# Patient Record
Sex: Male | Born: 1950
Health system: Southern US, Community
[De-identification: ages and names within clinical notes are randomized; demographics above are authoritative.]

## PROBLEM LIST (undated history)

## (undated) DIAGNOSIS — C61 Malignant neoplasm of prostate: Secondary | ICD-10-CM

## (undated) DIAGNOSIS — K219 Gastro-esophageal reflux disease without esophagitis: Secondary | ICD-10-CM

## (undated) DIAGNOSIS — E7800 Pure hypercholesterolemia, unspecified: Secondary | ICD-10-CM

## (undated) DIAGNOSIS — I10 Essential (primary) hypertension: Secondary | ICD-10-CM

## (undated) DIAGNOSIS — E039 Hypothyroidism, unspecified: Secondary | ICD-10-CM

## (undated) DIAGNOSIS — G4733 Obstructive sleep apnea (adult) (pediatric): Secondary | ICD-10-CM

## (undated) DIAGNOSIS — G709 Myoneural disorder, unspecified: Secondary | ICD-10-CM

## (undated) DIAGNOSIS — Z9884 Bariatric surgery status: Secondary | ICD-10-CM

## (undated) DIAGNOSIS — I350 Nonrheumatic aortic (valve) stenosis: Secondary | ICD-10-CM

## (undated) DIAGNOSIS — D649 Anemia, unspecified: Secondary | ICD-10-CM

## (undated) DIAGNOSIS — M1990 Unspecified osteoarthritis, unspecified site: Secondary | ICD-10-CM

## (undated) DIAGNOSIS — R011 Cardiac murmur, unspecified: Secondary | ICD-10-CM

## (undated) DIAGNOSIS — Z8719 Personal history of other diseases of the digestive system: Secondary | ICD-10-CM

## (undated) DIAGNOSIS — E119 Type 2 diabetes mellitus without complications: Secondary | ICD-10-CM

## (undated) DIAGNOSIS — G4730 Sleep apnea, unspecified: Secondary | ICD-10-CM

## (undated) DIAGNOSIS — E785 Hyperlipidemia, unspecified: Secondary | ICD-10-CM

## (undated) DIAGNOSIS — Z9989 Dependence on other enabling machines and devices: Secondary | ICD-10-CM

## (undated) DIAGNOSIS — Z6841 Body Mass Index (BMI) 40.0 and over, adult: Secondary | ICD-10-CM

## (undated) DIAGNOSIS — Z9889 Other specified postprocedural states: Secondary | ICD-10-CM

## (undated) HISTORY — DX: Dependence on other enabling machines and devices: Z99.89

## (undated) HISTORY — DX: Type 2 diabetes mellitus without complications: E11.9

## (undated) HISTORY — DX: Essential (primary) hypertension: I10

## (undated) HISTORY — DX: Obstructive sleep apnea (adult) (pediatric): G47.33

## (undated) HISTORY — DX: Cardiac murmur, unspecified: R01.1

## (undated) HISTORY — DX: Nonrheumatic aortic (valve) stenosis: I35.0

## (undated) HISTORY — DX: Sleep apnea, unspecified: G47.30

## (undated) HISTORY — DX: Gastro-esophageal reflux disease without esophagitis: K21.9

## (undated) HISTORY — PX: PROSTATE BIOPSY: SHX241

## (undated) HISTORY — PX: CARDIAC CATHETERIZATION: SHX172

## (undated) HISTORY — DX: Bariatric surgery status: Z98.84

## (undated) HISTORY — DX: Other specified postprocedural states: Z98.890

## (undated) HISTORY — DX: Body Mass Index (BMI) 40.0 and over, adult: Z684

## (undated) HISTORY — DX: Hyperlipidemia, unspecified: E78.5

## (undated) HISTORY — DX: Pure hypercholesterolemia, unspecified: E78.00

## (undated) HISTORY — DX: Personal history of other diseases of the digestive system: Z87.19

## (undated) HISTORY — DX: Morbid (severe) obesity due to excess calories: E66.01

---

## 1990-06-11 HISTORY — PX: HERNIA REPAIR: SHX51

## 1997-06-11 HISTORY — PX: OTHER SURGICAL HISTORY: SHX169

## 2007-12-03 ENCOUNTER — Ambulatory Visit (HOSPITAL_COMMUNITY): Admission: RE | Admit: 2007-12-03 | Discharge: 2007-12-03 | Payer: Self-pay | Admitting: Gastroenterology

## 2008-09-14 ENCOUNTER — Encounter: Admission: RE | Admit: 2008-09-14 | Discharge: 2008-09-14 | Payer: Self-pay | Admitting: General Practice

## 2010-10-24 NOTE — Op Note (Signed)
Dillon Jones, Dillon Jones                ACCOUNT NO.:  [id number]   MEDICAL RECORD NO.:  [id number]          PATIENT TYPE:  AMB   LOCATION:  ENDO                         FACILITY:  [facility]   PHYSICIAN:  James L. Malon Kindle., M.D.DATE OF BIRTH:  Mar 17, 1951   DATE OF PROCEDURE:  12/03/2007  DATE OF DISCHARGE:                               OPERATIVE REPORT   PROCEDURE:  Colonoscopy.   MEDICATIONS:  Fentanyl 75 mcg, Versed 6 mg IV.   INDICATIONS:  Slight anemia, in need of colon cancer screening.   DESCRIPTION OF PROCEDURE:  The procedure had been explained to the  patient and consent obtained, he has severe sleep apnea.  He was hooked  up to CPAP by respiratory therapy and monitored and did very well.  He  had the scope  inserted.  The Pentax adult scope was used.  We easily  reached the cecum; ileocecal valve and appendiceal orifice were seen.  The cecum, ascending, transverse, descending and sigmoid colon were seen  well.  No polyps, diverticula or any other lesions.  The scope was  retroflexed and the rectum was free of any lesions.  The scope was  withdrawn.  The patient tolerated the procedure well.   ASSESSMENT:  Normal screening colonoscopy in a gentleman with slight  anemia.   RECOMMEND:  Resume Hemoccults in 3-5 years.  Consider another  colonoscopy in 10 years.           ______________________________  [name]., M.D.     Waldron Session  D:  12/03/2007  T:  12/03/2007  Job:  [id number]   cc:   Otilio Connors. Gerri Spore, M.D.  Fax: [phone]

## 2012-02-06 ENCOUNTER — Ambulatory Visit (INDEPENDENT_AMBULATORY_CARE_PROVIDER_SITE_OTHER): Payer: BC Managed Care – PPO | Admitting: Surgery

## 2012-02-06 ENCOUNTER — Encounter (INDEPENDENT_AMBULATORY_CARE_PROVIDER_SITE_OTHER): Payer: Self-pay | Admitting: Surgery

## 2012-02-06 VITALS — BP 180/90 | HR 84 | Temp 97.2°F | Resp 16 | Ht 71.0 in | Wt >= 6400 oz

## 2012-02-06 DIAGNOSIS — E119 Type 2 diabetes mellitus without complications: Secondary | ICD-10-CM

## 2012-02-06 DIAGNOSIS — I10 Essential (primary) hypertension: Secondary | ICD-10-CM

## 2012-02-06 DIAGNOSIS — E7800 Pure hypercholesterolemia, unspecified: Secondary | ICD-10-CM

## 2012-02-06 DIAGNOSIS — Z9989 Dependence on other enabling machines and devices: Secondary | ICD-10-CM | POA: Insufficient documentation

## 2012-02-06 DIAGNOSIS — M1990 Unspecified osteoarthritis, unspecified site: Secondary | ICD-10-CM

## 2012-02-06 DIAGNOSIS — Z8719 Personal history of other diseases of the digestive system: Secondary | ICD-10-CM

## 2012-02-06 DIAGNOSIS — G4733 Obstructive sleep apnea (adult) (pediatric): Secondary | ICD-10-CM

## 2012-02-06 DIAGNOSIS — E039 Hypothyroidism, unspecified: Secondary | ICD-10-CM

## 2012-02-06 DIAGNOSIS — M129 Arthropathy, unspecified: Secondary | ICD-10-CM

## 2012-02-06 HISTORY — DX: Pure hypercholesterolemia, unspecified: E78.00

## 2012-02-06 HISTORY — DX: Type 2 diabetes mellitus without complications: E11.9

## 2012-02-06 HISTORY — DX: Obstructive sleep apnea (adult) (pediatric): G47.33

## 2012-02-06 HISTORY — DX: Personal history of other diseases of the digestive system: Z87.19

## 2012-02-06 NOTE — Patient Instructions (Signed)

## 2012-02-06 NOTE — Addendum Note (Signed)
Addended byLiliana Cline on: 02/06/2012 04:53 PM   Modules accepted: Orders

## 2012-02-06 NOTE — Progress Notes (Signed)
Chief Complaint:  History of morbid obesity for many years with BMI 60  History of Present Illness:  Dillon Jones is an 61 y.o. male who has been to our seminar.  In addition he has been to the Internet and has done quite a bit of research over the last couple years and once I Roux-en-Y gastric bypass. He has had up-and-down weight gain weight loss over number years sometimes being able to lose much is 40 pounds. He is recently become more concerned about difficulty in managing his diabetes for which she takes metformin, gylburide, and Actos.  He is interested in moving 4 with surgery for his weight and once to have a Roux-en-Y gastric bypass. However the procedure with him and some detail although distorted done a lot of research intentional about it. He's been saving a palatine him or he works at [employer]. He's had a previous umbilical hernia repair were later excised his umbilicus and implanted mesh.  Avoid proceed with the workup for a Roux-en-Y gastric bypass. He he RA uses a CPAP machine so we will not remove need that workup. I will encourage him to lose weight before we moved into this surgical ring and came in to better metal like shape.  Past Medical History  Diagnosis Date  . Diabetes mellitus   . GERD (gastroesophageal reflux disease)   . Hyperlipidemia   . Hypertension     Past Surgical History  Procedure Date  . Hernia repair     umb    Current Outpatient Prescriptions  Medication Sig Dispense Refill  . aspirin 81 MG tablet Take 81 mg by mouth daily.      Marland Kitchen glyBURIDE (DIABETA) 1.25 MG tablet Take 1.25 mg by mouth 2 (two) times daily with a meal. Pt cannot remember correct dosage      . ibuprofen (ADVIL,MOTRIN) 100 MG chewable tablet Chew 100 mg by mouth as needed.      Marland Kitchen levothyroxine (SYNTHROID, LEVOTHROID) 100 MCG tablet Take 100 mcg by mouth daily. Pt cannot remember correct dosage      . metFORMIN (GLUMETZA) 1000 MG (MOD) 24 hr tablet Take 1,000 mg by mouth 2  (two) times daily with a meal.      . pioglitazone (ACTOS) 15 MG tablet Take 15 mg by mouth daily. Pt cannot remember correct dosage      . simvastatin (ZOCOR) 10 MG tablet Take 10 mg by mouth at bedtime. Pt cannot remember correct dosage      . valsartan-hydrochlorothiazide (DIOVAN-HCT) 160-12.5 MG per tablet Take 1 tablet by mouth daily. Pt cannot remember correct dosage       Review of patient's allergies indicates no known allergies. Family History  Problem Relation Age of Onset  . Cancer Mother     breast  . Heart disease Father   . Diabetes Father   . Hypertension Father   . Diabetes Brother     2 brothers  . Hypertension Brother   . Diabetes Maternal Grandmother    Social History:   reports that he has never smoked. He has never used smokeless tobacco. He reports that he drinks alcohol. He reports that he does not use illicit drugs.   REVIEW OF SYSTEMS - PERTINENT POSITIVES ONLY: No DVT  Physical Exam:   Blood pressure 180/90, pulse 84, temperature 97.2 F (36.2 C), temperature source Temporal, resp. rate 16, height 5\' 11"  (1.803 m), weight 433 lb 3.2 oz (196.498 kg). Body mass index is 60.42 kg/(m^2).  Gen:  WDWN WM NAD  Neurological: Alert and oriented to person, place, and time. Motor and sensory function is grossly intact  Head: Normocephalic and atraumatic.  Eyes: Conjunctivae are normal. Pupils are equal, round, and reactive to light. No scleral icterus.  Neck: Normal range of motion. Neck supple. No tracheal deviation or thyromegaly present.  Cardiovascular:  SR without murmurs or gallops.  No carotid bruits Respiratory: Effort normal.  No respiratory distress. No chest wall tenderness. Breath sounds normal.  No wheezes, rales or rhonchi.  Abdomen:  Obese with no umbilicus and a transverse scar for repair with mesh.  GU: Musculoskeletal: Normal range of motion. Extremities are nontender. No cyanosis, edema or clubbing noted Lymphadenopathy: No cervical,  preauricular, postauricular or axillary adenopathy is present Skin: Skin is warm and dry. No rash noted. No diaphoresis. No erythema. No pallor. Pscyh: Normal mood and affect. Behavior is normal. Judgment and thought content normal.   LABORATORY RESULTS: No results found for this or any previous visit (from the past 48 hour(s)).  RADIOLOGY RESULTS: No results found.  Problem List: Patient Active Problem List  Diagnosis  . Diabetes mellitus-type II  . OSA on CPAP  . Hypothyroidism  . Hypertension  . Hypercholesteremia  . Arthritis  . H/O umbilical hernia repair-mesh used; done in [place]    Assessment & Plan: Super morbid obesity Lap roux en Y gastric bypass    Matt B. Daphine Deutscher, MD, [facility] [phone] beeper [phone]  02/06/2012 4:40 PM

## 2012-02-18 ENCOUNTER — Encounter: Payer: Self-pay | Admitting: *Deleted

## 2012-02-18 ENCOUNTER — Encounter: Payer: BC Managed Care – PPO | Attending: Surgery | Admitting: *Deleted

## 2012-02-18 DIAGNOSIS — Z713 Dietary counseling and surveillance: Secondary | ICD-10-CM | POA: Insufficient documentation

## 2012-02-18 DIAGNOSIS — Z01818 Encounter for other preprocedural examination: Secondary | ICD-10-CM | POA: Insufficient documentation

## 2012-02-18 LAB — CBC WITH DIFFERENTIAL/PLATELET
Basophils Relative: 1 % (ref 0–1)
Eosinophils Absolute: 0.2 10*3/uL (ref 0.0–0.7)
HCT: 38.4 % — ABNORMAL LOW (ref 39.0–52.0)
Hemoglobin: 12.3 g/dL — ABNORMAL LOW (ref 13.0–17.0)
MCH: 24.8 pg — ABNORMAL LOW (ref 26.0–34.0)
MCHC: 32 g/dL (ref 30.0–36.0)
MCV: 77.6 fL — ABNORMAL LOW (ref 78.0–100.0)
Monocytes Absolute: 0.5 10*3/uL (ref 0.1–1.0)
Monocytes Relative: 7 % (ref 3–12)

## 2012-02-18 LAB — HEMOGLOBIN A1C: Hgb A1c MFr Bld: 8.4 % — ABNORMAL HIGH (ref <5.7)

## 2012-02-18 LAB — TSH: TSH: 3.586 u[IU]/mL (ref 0.350–4.500)

## 2012-02-18 LAB — COMPREHENSIVE METABOLIC PANEL
Albumin: 4.1 g/dL (ref 3.5–5.2)
Alkaline Phosphatase: 46 U/L (ref 39–117)
BUN: 13 mg/dL (ref 6–23)
Glucose, Bld: 198 mg/dL — ABNORMAL HIGH (ref 70–99)
Potassium: 4.5 mEq/L (ref 3.5–5.3)
Total Bilirubin: 0.3 mg/dL (ref 0.3–1.2)

## 2012-02-18 LAB — LIPID PANEL
LDL Cholesterol: 88 mg/dL (ref 0–99)
Triglycerides: 130 mg/dL (ref <150)

## 2012-02-18 LAB — T4: T4, Total: 10.8 ug/dL (ref 5.0–12.5)

## 2012-02-18 NOTE — Patient Instructions (Signed)
   Follow Pre-Op Nutrition Goals to prepare for Gastric Bypass Surgery.   Call the [facility] at [phone] once you have been given your surgery date to enrolled in the Pre-Op Nutrition Class. You will need to attend this nutrition class 3-4 weeks prior to your surgery. 

## 2012-02-18 NOTE — Progress Notes (Signed)
  Pre-Op Assessment Visit:  Pre-Operative RYGB Surgery  Medical Nutrition Therapy:  Appt start time:  1000   End time:  1045.  Patient was seen on 02/18/2012 for Pre-Operative RYGB Nutrition Assessment. Assessment and letter of approval faxed to [facility] B[facility]ric Surgery Program coordinator on 02/18/2012.  Approval letter sent to [facility] center and will be available in the chart under the media tab.  Handouts given during visit include:  Pre-Op Goals   Bariatric Surgery Protein Shakes handout  Patient to call for Pre-Op and Post-Op Nutrition Education at the [facility] when surgery is scheduled.

## 2012-03-03 ENCOUNTER — Ambulatory Visit (HOSPITAL_COMMUNITY)
Admission: RE | Admit: 2012-03-03 | Discharge: 2012-03-03 | Disposition: A | Discharge status: Home / Self Care | Payer: BC Managed Care – PPO | Source: Ambulatory Visit | Attending: Surgery | Admitting: Surgery

## 2012-03-03 ENCOUNTER — Ambulatory Visit (HOSPITAL_COMMUNITY): Admission: RE | Admit: 2012-03-03 | Payer: BC Managed Care – PPO | Source: Ambulatory Visit | Admitting: Surgery

## 2012-03-03 ENCOUNTER — Encounter (HOSPITAL_COMMUNITY): Admission: RE | Payer: Self-pay | Source: Ambulatory Visit

## 2012-03-03 SURGERY — BREATH TEST, FOR HELICOBACTER PYLORI

## 2012-03-07 ENCOUNTER — Ambulatory Visit
Admission: RE | Admit: 2012-03-07 | Discharge: 2012-03-07 | Disposition: A | Discharge status: Home / Self Care | Payer: BC Managed Care – PPO | Source: Ambulatory Visit | Attending: Family Medicine | Admitting: Family Medicine

## 2012-03-07 ENCOUNTER — Other Ambulatory Visit: Payer: Self-pay | Admitting: Family Medicine

## 2012-03-07 DIAGNOSIS — I82409 Acute embolism and thrombosis of unspecified deep veins of unspecified lower extremity: Secondary | ICD-10-CM

## 2012-03-07 MED ORDER — IOHEXOL 300 MG/ML  SOLN
125.0000 mL | Freq: Once | INTRAMUSCULAR | Status: AC | PRN
  Administered 2012-03-07: 125 mL via INTRAVENOUS

## 2012-03-10 ENCOUNTER — Other Ambulatory Visit: Payer: BC Managed Care – PPO

## 2012-04-22 ENCOUNTER — Ambulatory Visit (HOSPITAL_COMMUNITY)
Admission: RE | Admit: 2012-04-22 | Discharge: 2012-04-22 | Disposition: A | Discharge status: Home / Self Care | Payer: BC Managed Care – PPO | Source: Ambulatory Visit | Attending: Surgery | Admitting: Surgery

## 2012-04-22 ENCOUNTER — Encounter (HOSPITAL_COMMUNITY): Payer: Self-pay

## 2012-04-22 ENCOUNTER — Encounter (HOSPITAL_COMMUNITY)
Admission: RE | Disposition: A | Discharge status: Home / Self Care | Payer: Self-pay | Source: Ambulatory Visit | Attending: Surgery

## 2012-04-22 ENCOUNTER — Encounter (HOSPITAL_COMMUNITY)
Admission: RE | Admit: 2012-04-22 | Discharge: 2012-04-22 | Disposition: A | Discharge status: Home / Self Care | Payer: BC Managed Care – PPO | Source: Ambulatory Visit | Attending: Surgery | Admitting: Surgery

## 2012-04-22 DIAGNOSIS — Z01818 Encounter for other preprocedural examination: Secondary | ICD-10-CM | POA: Insufficient documentation

## 2012-04-22 HISTORY — PX: BREATH TEK H PYLORI: SHX5422

## 2012-04-22 SURGERY — BREATH TEST, FOR HELICOBACTER PYLORI

## 2012-04-23 ENCOUNTER — Encounter (HOSPITAL_COMMUNITY): Payer: Self-pay | Admitting: Surgery

## 2012-04-29 ENCOUNTER — Other Ambulatory Visit (INDEPENDENT_AMBULATORY_CARE_PROVIDER_SITE_OTHER): Payer: Self-pay | Admitting: Surgery

## 2012-04-29 ENCOUNTER — Telehealth (INDEPENDENT_AMBULATORY_CARE_PROVIDER_SITE_OTHER): Payer: Self-pay | Admitting: General Surgery

## 2012-04-29 MED ORDER — AMOXICILL-CLARITHRO-LANSOPRAZ PO MISC: Freq: Two times a day (BID) | ORAL | Status: DC

## 2012-04-29 NOTE — Telephone Encounter (Signed)
Spoke with pt's wife and explained that his breath tek test came back positive for H pylori.  Dr. Daphine Deutscher sent in a Rx for a prevpac.  She said they would pick this up and start it.

## 2012-05-15 ENCOUNTER — Encounter: Payer: BC Managed Care – PPO | Attending: Surgery | Admitting: *Deleted

## 2012-05-15 VITALS — Ht 71.5 in | Wt >= 6400 oz

## 2012-05-15 DIAGNOSIS — Z713 Dietary counseling and surveillance: Secondary | ICD-10-CM | POA: Insufficient documentation

## 2012-05-15 DIAGNOSIS — Z6841 Body Mass Index (BMI) 40.0 and over, adult: Secondary | ICD-10-CM

## 2012-05-15 DIAGNOSIS — Z01818 Encounter for other preprocedural examination: Secondary | ICD-10-CM | POA: Insufficient documentation

## 2012-05-15 NOTE — Progress Notes (Signed)
Bariatric Class:  Appt start time: 1730   End time:  1830.  Pre-Operative Nutrition Class  Patient was seen on 05/15/12 for Pre-Operative Bariatric Surgery Education at the [facility].   Surgery date: 06/09/12 Surgery type: RYGB Start weight at [facility]: 429.3 lbs (02/18/12) Goal weight: 200 lbs  Weight today: 415.4 lbs BMI: 57.1  Samples given per MNT protocol: Bariatric Advantage Multivitamin Lot # [id number];  Exp: 06/15  Bariatric Advantage Calcium Citrate Lot # [id number];  Exp: 12/13 (*Pt alerted to upcoming expiration date)  Celebrate Vitamins Multivitamin Lot # [id number];  Exp: 07/14  Celebrate Vitamins Iron + C (18 mg) Lot # [id number];  Exp: 03/15  Celebrate Vitamins Calcium Citrate Lot # [id number];  Exp: 08/15  Corliss Marcus Protein Shake Lot # [id number];  Exp: 02/15  Premier Protein Shake Lot # [id number];  Exp: 03/29/13  The following the learning objective met by the patient during this course:  Identifies Pre-Op Dietary Goals and will begin 2 weeks pre-operatively  Identifies appropriate sources of fluids and proteins   States protein recommendations and appropriate sources pre and post-operatively  Identifies Post-Operative Dietary Goals and will follow for 2 weeks post-operatively  Identifies appropriate multivitamin and calcium sources  Describes the need for physical activity post-operatively and will follow MD recommendations  States when to call healthcare provider regarding medication questions or post-operative complications  Handouts given during class include:  Pre-Op Bariatric Surgery Diet Handout  Protein Shake Handout  Post-Op Bariatric Surgery Nutrition Handout  BELT Program Information Flyer  Support Group Information Flyer  [facility] Bariatric Supplements Price List  Follow-Up Plan: Patient will follow-up at [facility] 2 weeks post operatively for diet advancement per MD.

## 2012-05-18 ENCOUNTER — Encounter: Payer: Self-pay | Admitting: *Deleted

## 2012-05-18 NOTE — Patient Instructions (Signed)
Follow:   Pre-Op Diet per MD 2 weeks prior to surgery  Phase 2- Liquids (clear/full) 2 weeks after surgery  Vitamin/Mineral/Calcium guidelines for purchasing bariatric supplements  Exercise guidelines pre and post-op per MD  Follow-up at [facility] in 2 weeks post-op for diet advancement. Contact Harsha Yusko as needed with questions/concerns. 

## 2012-05-28 NOTE — Progress Notes (Signed)
EKG 04/22/12 on EPIC, Chest x-ray 03/03/12 on EPIC

## 2012-05-28 NOTE — Progress Notes (Signed)
Need pre-op orders for appointment on 05/29/12 at 930 please.

## 2012-05-28 NOTE — Patient Instructions (Addendum)
20 LEVIS NAZIR  05/28/2012   Your procedure is scheduled on: 06/09/12  Report to [facility] at [phone].  Call this number if you have problems the morning of surgery 336-: [phone]   Remember:   Do not eat food or drink liquids After Midnight.     Take these medicines the morning of surgery with A SIP OF WATER: synthroid, amlodipine, simvastatin    Do not wear jewelry, make-up or nail polish.  Do not wear lotions, powders, or perfumes. You may wear deodorant.  Do not shave 48 hours prior to surgery. Men may shave face and neck.  Do not bring valuables to the hospital.  Contacts, dentures or bridgework may not be worn into surgery.  Leave suitcase in the car. After surgery it may be brought to your room.  For patients admitted to the hospital, checkout time is 11:00 AM the day of discharge.     Special Instructions: Shower using CHG 2 nights before surgery and the night before surgery.  If you shower the day of surgery use CHG.  Use special wash - you have one bottle of CHG for all showers.  You should use approximately 1/3 of the bottle for each shower.   Please read over the following fact sheets that you were given: MRSA Information.  Birdie Sons, RN  pre op nurse call if needed [phone]    FAILURE TO FOLLOW THESE INSTRUCTIONS MAY RESULT IN CANCELLATION OF YOUR SURGERY   Patient Signature: ___________________________________________

## 2012-05-29 ENCOUNTER — Encounter (HOSPITAL_COMMUNITY)
Admission: RE | Admit: 2012-05-29 | Discharge: 2012-05-29 | Disposition: A | Discharge status: Home / Self Care | Payer: BC Managed Care – PPO | Source: Ambulatory Visit | Attending: Surgery | Admitting: Surgery

## 2012-05-29 ENCOUNTER — Encounter (HOSPITAL_COMMUNITY): Payer: Self-pay

## 2012-05-29 ENCOUNTER — Encounter (HOSPITAL_COMMUNITY): Payer: Self-pay | Admitting: Pharmacy Technician

## 2012-05-29 HISTORY — DX: Hypothyroidism, unspecified: E03.9

## 2012-05-29 HISTORY — DX: Unspecified osteoarthritis, unspecified site: M19.90

## 2012-05-29 HISTORY — DX: Anemia, unspecified: D64.9

## 2012-05-29 LAB — CBC
MCV: 79.3 fL (ref 78.0–100.0)
Platelets: 250 10*3/uL (ref 150–400)
RBC: 5.11 MIL/uL (ref 4.22–5.81)
WBC: 7.4 10*3/uL (ref 4.0–10.5)

## 2012-05-29 LAB — BASIC METABOLIC PANEL
CO2: 27 mEq/L (ref 19–32)
Chloride: 97 mEq/L (ref 96–112)
Creatinine, Ser: 0.77 mg/dL (ref 0.50–1.35)
Potassium: 4.3 mEq/L (ref 3.5–5.1)
Sodium: 135 mEq/L (ref 135–145)

## 2012-05-29 LAB — SURGICAL PCR SCREEN: Staphylococcus aureus: NEGATIVE

## 2012-06-05 ENCOUNTER — Ambulatory Visit (INDEPENDENT_AMBULATORY_CARE_PROVIDER_SITE_OTHER): Payer: BC Managed Care – PPO | Admitting: Surgery

## 2012-06-05 ENCOUNTER — Other Ambulatory Visit (INDEPENDENT_AMBULATORY_CARE_PROVIDER_SITE_OTHER): Payer: Self-pay | Admitting: Surgery

## 2012-06-05 ENCOUNTER — Encounter (INDEPENDENT_AMBULATORY_CARE_PROVIDER_SITE_OTHER): Payer: Self-pay | Admitting: Surgery

## 2012-06-05 ENCOUNTER — Other Ambulatory Visit (INDEPENDENT_AMBULATORY_CARE_PROVIDER_SITE_OTHER): Payer: Self-pay

## 2012-06-05 VITALS — BP 136/82 | HR 80 | Temp 97.6°F | Resp 18 | Ht 71.5 in | Wt >= 6400 oz

## 2012-06-05 DIAGNOSIS — Z6841 Body Mass Index (BMI) 40.0 and over, adult: Secondary | ICD-10-CM

## 2012-06-05 HISTORY — DX: Morbid (severe) obesity due to excess calories: E66.01

## 2012-06-05 NOTE — Patient Instructions (Signed)

## 2012-06-05 NOTE — Progress Notes (Signed)
Chief Complaint:  Morbid obesity BMI 55.8; weight 405 lbs  History of Present Illness:  Dillon Jones is an 61 y.o. male seen by me in August for Roux Y Gastric bypass surgery.  Did have some swelling in his right leg that may have been cellulitis. No DVT was found. He has lost down to a BMI of 55.8. We discussed his prior ventral hernia repair and he realizes that this could create adhesions which would preclude a gastric bypass. Hopefully that will not be the case. We will go ahead and do a bowel prep on him preoperatively and I discussed that with him. He signed his permit which I cosigned. He is aware of the risk of this procedure.  Past Medical History  Diagnosis Date  . Hyperlipidemia   . Hypertension   . Morbid obesity   . GERD (gastroesophageal reflux disease)     Occasional - only 1x q3m  . Sleep apnea   . Hypothyroidism   . Diabetes mellitus     Has had for 13 years, type 2  . Arthritis   . Anemia     Past Surgical History  Procedure Date  . Hernia repair 1992    umbilical  . Breath tek h pylori 04/22/2012    Procedure: BREATH TEK H PYLORI;  Surgeon: Khye Hochstetler B Kirandeep Fariss, MD;  Location: [facility];  Service: General;  Laterality: N/A;  . Right knee arthroscopy 1999    Current Outpatient Prescriptions  Medication Sig Dispense Refill  . amLODipine (NORVASC) 10 MG tablet Take 10 mg by mouth daily before breakfast.       . glyBURIDE (DIABETA) 2.5 MG tablet Take 2.5 mg by mouth 2 (two) times daily with a meal.      . levothyroxine (SYNTHROID, LEVOTHROID) 200 MCG tablet Take 200 mcg by mouth daily before breakfast.       . losartan-hydrochlorothiazide (HYZAAR) 100-12.5 MG per tablet Take 1 tablet by mouth daily before breakfast.       . metFORMIN (GLUMETZA) 1000 MG (MOD) 24 hr tablet Take 1,000 mg by mouth 2 (two) times daily with a meal.      . Multiple Vitamin (MULTIVITAMIN WITH MINERALS) TABS Take 1 tablet by mouth daily.      . pioglitazone (ACTOS) 15 MG tablet Take 15 mg  by mouth daily before breakfast.       . simvastatin (ZOCOR) 40 MG tablet Take 20 mg by mouth daily before breakfast. Takes 1/2      . aspirin 81 MG tablet Take 81 mg by mouth daily.      . ibuprofen (ADVIL,MOTRIN) 200 MG tablet Take 600 mg by mouth every 6 (six) hours as needed. Pain      . naproxen sodium (ANAPROX) 220 MG tablet Take 660 mg by mouth 2 (two) times daily with a meal.       Review of patient's allergies indicates no known allergies. Family History  Problem Relation Age of Onset  . Cancer Mother     breast  . Heart disease Father   . Diabetes Father   . Hypertension Father   . Diabetes Brother     2 brothers  . Hypertension Brother   . Diabetes Maternal Grandmother    Social History:   reports that he has never smoked. He has never used smokeless tobacco. He reports that he drinks alcohol. He reports that he does not use illicit drugs.   REVIEW OF SYSTEMS - PERTINENT POSITIVES ONLY: No history of   DVT  Physical Exam:   Blood pressure 136/82, pulse 80, temperature 97.6 F (36.4 C), temperature source Temporal, resp. rate 18, height 5' 11.5" (1.816 m), weight 405 lb 12.8 oz (184.07 kg). Body mass index is 55.81 kg/(m^2).  Gen:  WDWN white male NAD  Neurological: Alert and oriented to person, place, and time. Motor and sensory function is grossly intact  Head: Normocephalic and atraumatic.  Eyes: Conjunctivae are normal. Pupils are equal, round, and reactive to light. No scleral icterus.  Neck: Normal range of motion. Neck supple. No tracheal deviation or thyromegaly present.  Cardiovascular:  SR without murmurs or gallops.  No carotid bruits Respiratory: Effort normal.  No respiratory distress. No chest wall tenderness. Breath sounds normal.  No wheezes, rales or rhonchi.  Abdomen:  Transverse incision from prior ventral hernia repair. GU: Musculoskeletal: Normal range of motion. Extremities are nontender. No cyanosis, edema or clubbing noted Lymphadenopathy: No  cervical, preauricular, postauricular or axillary adenopathy is present Skin: Skin is warm and dry. No rash noted. No diaphoresis. No erythema. No pallor. Pscyh: Normal mood and affect. Behavior is normal. Judgment and thought content normal.   LABORATORY RESULTS: No results found for this or any previous visit (from the past 48 hour(s)).  RADIOLOGY RESULTS: No results found.  Problem List: Patient Active Problem List  Diagnosis  . Diabetes mellitus-type II  . OSA on CPAP  . Hypothyroidism  . Hypertension  . Hypercholesteremia  . Arthritis  . H/O umbilical hernia repair-mesh used; done in [place]    Assessment & Plan: Morbid obesity and a male with a BMI 55.8. Plan Roux-en-Y gastric bypass next Monday, December 30.    Matt B. Elianah Karis, MD, FACS  [facility] [phone] beeper [phone]  06/05/2012 11:12 AM     

## 2012-06-09 ENCOUNTER — Encounter (HOSPITAL_COMMUNITY): Payer: Self-pay | Admitting: *Deleted

## 2012-06-09 ENCOUNTER — Encounter (HOSPITAL_COMMUNITY): Payer: Self-pay | Admitting: Anesthesiology

## 2012-06-09 ENCOUNTER — Encounter (HOSPITAL_COMMUNITY)
Admission: RE | Disposition: A | Discharge status: Home / Self Care | Payer: Self-pay | Source: Ambulatory Visit | Attending: Surgery

## 2012-06-09 ENCOUNTER — Inpatient Hospital Stay (HOSPITAL_COMMUNITY)
Admission: RE | Admit: 2012-06-09 | Discharge: 2012-06-11 | DRG: 288 | Disposition: A | Discharge status: Home / Self Care | Payer: BC Managed Care – PPO | Source: Ambulatory Visit | Attending: Surgery | Admitting: Surgery

## 2012-06-09 ENCOUNTER — Inpatient Hospital Stay (HOSPITAL_COMMUNITY): Payer: BC Managed Care – PPO | Admitting: Anesthesiology

## 2012-06-09 ENCOUNTER — Other Ambulatory Visit (INDEPENDENT_AMBULATORY_CARE_PROVIDER_SITE_OTHER): Payer: Self-pay | Admitting: Surgery

## 2012-06-09 DIAGNOSIS — G4733 Obstructive sleep apnea (adult) (pediatric): Secondary | ICD-10-CM

## 2012-06-09 DIAGNOSIS — E785 Hyperlipidemia, unspecified: Secondary | ICD-10-CM

## 2012-06-09 DIAGNOSIS — Z79899 Other long term (current) drug therapy: Secondary | ICD-10-CM

## 2012-06-09 DIAGNOSIS — Z7982 Long term (current) use of aspirin: Secondary | ICD-10-CM

## 2012-06-09 DIAGNOSIS — I10 Essential (primary) hypertension: Secondary | ICD-10-CM

## 2012-06-09 DIAGNOSIS — E039 Hypothyroidism, unspecified: Secondary | ICD-10-CM | POA: Diagnosis present

## 2012-06-09 DIAGNOSIS — N4889 Other specified disorders of penis: Secondary | ICD-10-CM | POA: Diagnosis present

## 2012-06-09 DIAGNOSIS — Z6841 Body Mass Index (BMI) 40.0 and over, adult: Secondary | ICD-10-CM

## 2012-06-09 DIAGNOSIS — M129 Arthropathy, unspecified: Secondary | ICD-10-CM | POA: Diagnosis present

## 2012-06-09 DIAGNOSIS — Z9884 Bariatric surgery status: Secondary | ICD-10-CM

## 2012-06-09 DIAGNOSIS — K430 Incisional hernia with obstruction, without gangrene: Secondary | ICD-10-CM | POA: Diagnosis present

## 2012-06-09 DIAGNOSIS — K219 Gastro-esophageal reflux disease without esophagitis: Secondary | ICD-10-CM | POA: Diagnosis present

## 2012-06-09 DIAGNOSIS — E119 Type 2 diabetes mellitus without complications: Secondary | ICD-10-CM | POA: Diagnosis present

## 2012-06-09 DIAGNOSIS — N35919 Unspecified urethral stricture, male, unspecified site: Secondary | ICD-10-CM | POA: Diagnosis present

## 2012-06-09 HISTORY — PX: GASTRIC ROUX-EN-Y: SHX5262

## 2012-06-09 HISTORY — PX: VENTRAL HERNIA REPAIR: SHX424

## 2012-06-09 HISTORY — DX: Bariatric surgery status: Z98.84

## 2012-06-09 LAB — GLUCOSE, CAPILLARY: Glucose-Capillary: 204 mg/dL — ABNORMAL HIGH (ref 70–99)

## 2012-06-09 LAB — COMPREHENSIVE METABOLIC PANEL
ALT: 20 U/L (ref 0–53)
AST: 20 U/L (ref 0–37)
Albumin: 3.7 g/dL (ref 3.5–5.2)
Calcium: 9.4 mg/dL (ref 8.4–10.5)
Creatinine, Ser: 0.78 mg/dL (ref 0.50–1.35)
GFR calc non Af Amer: >90 mL/min (ref >90)
Sodium: 136 mEq/L (ref 135–145)
Total Protein: 8.1 g/dL (ref 6.0–8.3)

## 2012-06-09 LAB — CBC WITH DIFFERENTIAL/PLATELET
Basophils Absolute: 0 10*3/uL (ref 0.0–0.1)
Basophils Relative: 0 % (ref 0–1)
Eosinophils Absolute: 0.1 10*3/uL (ref 0.0–0.7)
Eosinophils Relative: 1 % (ref 0–5)
HCT: 39.7 % (ref 39.0–52.0)
Lymphocytes Relative: 14 % (ref 12–46)
MCHC: 32.7 g/dL (ref 30.0–36.0)
MCV: 79.2 fL (ref 78.0–100.0)
Monocytes Absolute: 0.6 10*3/uL (ref 0.1–1.0)
Platelets: 254 10*3/uL (ref 150–400)
RDW: 16.6 % — ABNORMAL HIGH (ref 11.5–15.5)
WBC: 9 10*3/uL (ref 4.0–10.5)

## 2012-06-09 SURGERY — LAPAROSCOPIC ROUX-EN-Y GASTRIC
Anesthesia: General | Site: Abdomen | Wound class: Clean Contaminated

## 2012-06-09 MED ORDER — BUPIVACAINE LIPOSOME 1.3 % IJ SUSP
INTRAMUSCULAR | Status: DC | PRN
  Administered 2012-06-09: 20 mL

## 2012-06-09 MED ORDER — POTASSIUM CHLORIDE IN NACL 20-0.45 MEQ/L-% IV SOLN
INTRAVENOUS | Status: DC
  Administered 2012-06-09: 100 mL/h via INTRAVENOUS
  Administered 2012-06-10: 100 mL via INTRAVENOUS
  Filled 2012-06-09 (×7): qty 1000

## 2012-06-09 MED ORDER — UNJURY VANILLA POWDER
2.0000 [oz_av] | Freq: Four times a day (QID) | ORAL | Status: DC
  Administered 2012-06-11: 2 [oz_av] via ORAL

## 2012-06-09 MED ORDER — ONDANSETRON HCL 4 MG/2ML IJ SOLN
INTRAMUSCULAR | Status: DC | PRN
  Administered 2012-06-09: 4 mg via INTRAVENOUS

## 2012-06-09 MED ORDER — LACTATED RINGERS IR SOLN
Status: DC | PRN
  Administered 2012-06-09: 3000 mL

## 2012-06-09 MED ORDER — PROMETHAZINE HCL 25 MG/ML IJ SOLN: 6.2500 mg | INTRAMUSCULAR | Status: DC | PRN

## 2012-06-09 MED ORDER — HYDROMORPHONE HCL PF 1 MG/ML IJ SOLN: 0.2500 mg | INTRAMUSCULAR | Status: DC | PRN

## 2012-06-09 MED ORDER — DEXTROSE 5 % IV SOLN
2.0000 g | INTRAVENOUS | Status: AC
  Administered 2012-06-09: 2 g via INTRAVENOUS
  Filled 2012-06-09: qty 2

## 2012-06-09 MED ORDER — ACETAMINOPHEN 160 MG/5ML PO SOLN: 650.0000 mg | ORAL | Status: DC | PRN

## 2012-06-09 MED ORDER — CHLORHEXIDINE GLUCONATE 4 % EX LIQD: 1.0000 "application " | Freq: Once | CUTANEOUS | Status: DC

## 2012-06-09 MED ORDER — ROCURONIUM BROMIDE 100 MG/10ML IV SOLN
INTRAVENOUS | Status: DC | PRN
  Administered 2012-06-09 (×2): 20 mg via INTRAVENOUS
  Administered 2012-06-09: 10 mg via INTRAVENOUS
  Administered 2012-06-09 (×2): 20 mg via INTRAVENOUS
  Administered 2012-06-09: 50 mg via INTRAVENOUS
  Administered 2012-06-09: 10 mg via INTRAVENOUS

## 2012-06-09 MED ORDER — 0.9 % SODIUM CHLORIDE (POUR BTL) OPTIME
TOPICAL | Status: DC | PRN
  Administered 2012-06-09: 1000 mL

## 2012-06-09 MED ORDER — ONDANSETRON HCL 4 MG/2ML IJ SOLN: 4.0000 mg | INTRAMUSCULAR | Status: DC | PRN

## 2012-06-09 MED ORDER — HEPARIN SODIUM (PORCINE) 5000 UNIT/ML IJ SOLN
5000.0000 [IU] | Freq: Three times a day (TID) | INTRAMUSCULAR | Status: DC
  Administered 2012-06-09 – 2012-06-11 (×5): 5000 [IU] via SUBCUTANEOUS
  Filled 2012-06-09 (×9): qty 1

## 2012-06-09 MED ORDER — UNJURY CHICKEN SOUP POWDER: 2.0000 [oz_av] | Freq: Four times a day (QID) | ORAL | Status: DC

## 2012-06-09 MED ORDER — MIDAZOLAM HCL 5 MG/5ML IJ SOLN
INTRAMUSCULAR | Status: DC | PRN
  Administered 2012-06-09: 2 mg via INTRAVENOUS

## 2012-06-09 MED ORDER — SUCCINYLCHOLINE CHLORIDE 20 MG/ML IJ SOLN
INTRAMUSCULAR | Status: DC | PRN
  Administered 2012-06-09: 200 mg via INTRAVENOUS

## 2012-06-09 MED ORDER — KETOROLAC TROMETHAMINE 30 MG/ML IJ SOLN: 15.0000 mg | Freq: Once | INTRAMUSCULAR | Status: DC | PRN

## 2012-06-09 MED ORDER — MORPHINE SULFATE 2 MG/ML IJ SOLN: 2.0000 mg | INTRAMUSCULAR | Status: DC | PRN

## 2012-06-09 MED ORDER — HEPARIN SODIUM (PORCINE) 5000 UNIT/ML IJ SOLN
5000.0000 [IU] | INTRAMUSCULAR | Status: AC
  Administered 2012-06-09: 5000 [IU] via SUBCUTANEOUS
  Filled 2012-06-09: qty 1

## 2012-06-09 MED ORDER — TISSEEL VH 10 ML EX KIT
PACK | CUTANEOUS | Status: DC | PRN
  Administered 2012-06-09: 10 mL

## 2012-06-09 MED ORDER — GLYCOPYRROLATE 0.2 MG/ML IJ SOLN
INTRAMUSCULAR | Status: DC | PRN
  Administered 2012-06-09: .9 mg via INTRAVENOUS

## 2012-06-09 MED ORDER — CHLORHEXIDINE GLUCONATE 4 % EX LIQD
1.0000 "application " | Freq: Once | CUTANEOUS | Status: DC
  Filled 2012-06-09: qty 15

## 2012-06-09 MED ORDER — HYDROMORPHONE HCL PF 1 MG/ML IJ SOLN
INTRAMUSCULAR | Status: DC | PRN
  Administered 2012-06-09 (×2): 0.5 mg via INTRAVENOUS
  Administered 2012-06-09: 1 mg via INTRAVENOUS
  Administered 2012-06-09: 0.5 mg via INTRAVENOUS

## 2012-06-09 MED ORDER — UNJURY CHOCOLATE CLASSIC POWDER: 2.0000 [oz_av] | Freq: Four times a day (QID) | ORAL | Status: DC

## 2012-06-09 MED ORDER — NEOSTIGMINE METHYLSULFATE 1 MG/ML IJ SOLN
INTRAMUSCULAR | Status: DC | PRN
  Administered 2012-06-09: 5 mg via INTRAVENOUS

## 2012-06-09 MED ORDER — FENTANYL CITRATE 0.05 MG/ML IJ SOLN
INTRAMUSCULAR | Status: DC | PRN
  Administered 2012-06-09 (×4): 50 ug via INTRAVENOUS
  Administered 2012-06-09: 100 ug via INTRAVENOUS
  Administered 2012-06-09 (×4): 50 ug via INTRAVENOUS

## 2012-06-09 MED ORDER — INSULIN ASPART 100 UNIT/ML ~~LOC~~ SOLN
0.0000 [IU] | SUBCUTANEOUS | Status: DC
  Administered 2012-06-09: 7 [IU] via SUBCUTANEOUS
  Administered 2012-06-10: 3 [IU] via SUBCUTANEOUS
  Administered 2012-06-10 (×3): 4 [IU] via SUBCUTANEOUS
  Administered 2012-06-11 (×2): 3 [IU] via SUBCUTANEOUS

## 2012-06-09 MED ORDER — LIDOCAINE HCL (CARDIAC) 20 MG/ML IV SOLN
INTRAVENOUS | Status: DC | PRN
  Administered 2012-06-09: 50 mg via INTRAVENOUS

## 2012-06-09 MED ORDER — LACTATED RINGERS IV SOLN
INTRAVENOUS | Status: DC
  Administered 2012-06-09: 14:00:00 via INTRAVENOUS
  Administered 2012-06-09: 1000 mL via INTRAVENOUS

## 2012-06-09 MED ORDER — OXYCODONE-ACETAMINOPHEN 5-325 MG/5ML PO SOLN: 5.0000 mL | ORAL | Status: DC | PRN

## 2012-06-09 MED ORDER — INSULIN GLARGINE 100 UNIT/ML ~~LOC~~ SOLN
10.0000 [IU] | Freq: Every day | SUBCUTANEOUS | Status: DC
  Administered 2012-06-09 – 2012-06-10 (×2): 10 [IU] via SUBCUTANEOUS

## 2012-06-09 MED ORDER — BUPIVACAINE LIPOSOME 1.3 % IJ SUSP
20.0000 mL | Freq: Once | INTRAMUSCULAR | Status: DC
  Filled 2012-06-09: qty 20

## 2012-06-09 SURGICAL SUPPLY — 72 items
APPLICATOR COTTON TIP 6IN STRL (MISCELLANEOUS) IMPLANT
APPLIER CLIP ROT 10 11.4 M/L (STAPLE) ×3
BAG URINE LEG 500ML (DRAIN) ×3 IMPLANT
BENZOIN TINCTURE PRP APPL 2/3 (GAUZE/BANDAGES/DRESSINGS) IMPLANT
BLADE SURG 15 STRL LF DISP TIS (BLADE) ×2 IMPLANT
BLADE SURG 15 STRL SS (BLADE) ×1
CABLE HIGH FREQUENCY MONO STRZ (ELECTRODE) ×3 IMPLANT
CANISTER SUCTION 2500CC (MISCELLANEOUS) ×3 IMPLANT
CATH FOLEY 2WAY SLVR  5CC 16FR (CATHETERS) ×1
CATH FOLEY 2WAY SLVR 5CC 16FR (CATHETERS) ×2 IMPLANT
CLIP APPLIE ROT 10 11.4 M/L (STAPLE) ×2 IMPLANT
CLIP SUT LAPRA TY ABSORB (SUTURE) ×6 IMPLANT
CLOTH BEACON ORANGE TIMEOUT ST (SAFETY) ×3 IMPLANT
COVER SURGICAL LIGHT HANDLE (MISCELLANEOUS) ×3 IMPLANT
DERMABOND ADVANCED (GAUZE/BANDAGES/DRESSINGS) ×1
DERMABOND ADVANCED .7 DNX12 (GAUZE/BANDAGES/DRESSINGS) ×2 IMPLANT
DEVICE SUTURE ENDOST 10MM (ENDOMECHANICALS) ×3 IMPLANT
DISSECTOR BLUNT TIP ENDO 5MM (MISCELLANEOUS) IMPLANT
DRAIN PENROSE 18X1/4 LTX STRL (WOUND CARE) ×3 IMPLANT
DRAPE CAMERA CLOSED 9X96 (DRAPES) ×3 IMPLANT
GAUZE SPONGE 2X2 8PLY STRL LF (GAUZE/BANDAGES/DRESSINGS) ×2 IMPLANT
GAUZE SPONGE 4X4 16PLY XRAY LF (GAUZE/BANDAGES/DRESSINGS) ×3 IMPLANT
GEL PDS (MISCELLANEOUS) ×6 IMPLANT
GLOVE BIOGEL M 8.0 STRL (GLOVE) ×3 IMPLANT
GOWN STRL NON-REIN LRG LVL3 (GOWN DISPOSABLE) ×3 IMPLANT
GOWN STRL REIN XL XLG (GOWN DISPOSABLE) ×9 IMPLANT
HANDLE STAPLE EGIA 4 XL (STAPLE) ×3 IMPLANT
HOVERMATT SINGLE USE (MISCELLANEOUS) ×3 IMPLANT
KIT BASIN OR (CUSTOM PROCEDURE TRAY) ×3 IMPLANT
KIT GASTRIC LAVAGE 34FR ADT (SET/KITS/TRAYS/PACK) ×3 IMPLANT
MARKER SKIN DUAL TIP RULER LAB (MISCELLANEOUS) ×3 IMPLANT
NEEDLE SPNL 22GX3.5 QUINCKE BK (NEEDLE) ×3 IMPLANT
NS IRRIG 1000ML POUR BTL (IV SOLUTION) ×3 IMPLANT
PACK CARDIOVASCULAR III (CUSTOM PROCEDURE TRAY) ×3 IMPLANT
RELOAD EGIA 45 MED/THCK PURPLE (STAPLE) ×6 IMPLANT
RELOAD EGIA 45 TAN VASC (STAPLE) ×3 IMPLANT
RELOAD EGIA 60 MED/THCK PURPLE (STAPLE) ×12 IMPLANT
RELOAD EGIA 60 TAN VASC (STAPLE) ×3 IMPLANT
RELOAD ENDO STITCH 2.0 (ENDOMECHANICALS) ×10
RETRACTOR LAPSCP 12X46 CVD (ENDOMECHANICALS) ×2 IMPLANT
RTRCTR LAPSCP 12X46 CVD (ENDOMECHANICALS) ×3
SCISSORS LAP 5X45 EPIX DISP (ENDOMECHANICALS) ×3 IMPLANT
SEALANT SURGICAL APPL DUAL CAN (MISCELLANEOUS) ×3 IMPLANT
SET IRRIG TUBING LAPAROSCOPIC (IRRIGATION / IRRIGATOR) ×3 IMPLANT
SHEARS CURVED HARMONIC AC 45CM (MISCELLANEOUS) ×3 IMPLANT
SLEEVE ADV FIXATION 12X100MM (TROCAR) ×6 IMPLANT
SLEEVE ADV FIXATION 5X100MM (TROCAR) ×3 IMPLANT
SOLUTION ANTI FOG 6CC (MISCELLANEOUS) ×3 IMPLANT
SPONGE GAUZE 2X2 STER 10/PKG (GAUZE/BANDAGES/DRESSINGS) ×1
SPONGE GAUZE 4X4 12PLY (GAUZE/BANDAGES/DRESSINGS) ×3 IMPLANT
STAPLER VISISTAT 35W (STAPLE) ×3 IMPLANT
STRIP CLOSURE SKIN 1/2X4 (GAUZE/BANDAGES/DRESSINGS) IMPLANT
STRIP PERI DRY VERITAS 45 (STAPLE) ×6 IMPLANT
STRIP PERI DRY VERITAS 60 (STAPLE) ×6 IMPLANT
SUT RELOAD ENDO STITCH 2 48X1 (ENDOMECHANICALS) ×12
SUT RELOAD ENDO STITCH 2.0 (ENDOMECHANICALS) ×8
SUT VIC AB 2-0 SH 27 (SUTURE) ×1
SUT VIC AB 2-0 SH 27X BRD (SUTURE) ×2 IMPLANT
SUT VIC AB 4-0 SH 18 (SUTURE) ×3 IMPLANT
SUT VICRYL 0 TIES 12 18 (SUTURE) ×3 IMPLANT
SUTURE RELOAD END STTCH 2 48X1 (ENDOMECHANICALS) ×12 IMPLANT
SUTURE RELOAD ENDO STITCH 2.0 (ENDOMECHANICALS) ×8 IMPLANT
SYR 20CC LL (SYRINGE) ×3 IMPLANT
SYR 30ML LL (SYRINGE) ×3 IMPLANT
SYR 50ML LL SCALE MARK (SYRINGE) ×3 IMPLANT
TRAY FOLEY CATH 14FRSI W/METER (CATHETERS) ×3 IMPLANT
TROCAR ADV FIXATION 12X100MM (TROCAR) ×6 IMPLANT
TROCAR XCEL 12X100 BLDLESS (ENDOMECHANICALS) ×3 IMPLANT
TROCAR Z-THREAD FIOS 5X100MM (TROCAR) ×3 IMPLANT
TUBING CONNECTING 10 (TUBING) ×3 IMPLANT
TUBING ENDO SMARTCAP (MISCELLANEOUS) ×3 IMPLANT
TUBING FILTER THERMOFLATOR (ELECTROSURGICAL) ×3 IMPLANT

## 2012-06-09 NOTE — Progress Notes (Signed)
Selena Batten, R.N.- From PACU TO TAKE PATIENT to Stepdown Unit and give report

## 2012-06-09 NOTE — Op Note (Signed)
Upper GI endoscopy is performed at the completion of laparoscopic Roux-en-Y gastric bypass by Dr. Daphine Deutscher. The Olympus video endoscope was inserted into the upper esophagus and then passed under direct vision to the EG junction. The  gastric pouch was insufflated with air while the gastric outlet was clamped under irrigation by the operating surgeon. There was no evidence of leak. The anastomosis was visualized and was patent. Suture and staple lines were intact and without bleeding. The pouch was tubular and measured approximately 6-7 cm in length. At the completion of the procedure the pouch was desufflated and the scope withdrawn.

## 2012-06-09 NOTE — Anesthesia Preprocedure Evaluation (Addendum)
Anesthesia Evaluation  Patient identified by MRN, date of birth, ID band Patient awake    Reviewed: Allergy & Precautions, H&P , NPO status , Patient's Chart, lab work & pertinent test results  Airway Mallampati: III TM Distance: <3 FB Neck ROM: Full    Dental No notable dental hx. (+) Dental Advisory Given   Pulmonary sleep apnea and Continuous Positive Airway Pressure Ventilation ,  breath sounds clear to auscultation  + decreased breath sounds      Cardiovascular hypertension, Pt. on medications Rhythm:Regular Rate:Normal     Neuro/Psych negative neurological ROS  negative psych ROS   GI/Hepatic negative GI ROS, Neg liver ROS,   Endo/Other  diabetes, Type 2Hypothyroidism Morbid obesity  Renal/GU negative Renal ROS  negative genitourinary   Musculoskeletal negative musculoskeletal ROS (+)   Abdominal   Peds negative pediatric ROS (+)  Hematology negative hematology ROS (+)   Anesthesia Other Findings   Reproductive/Obstetrics negative OB ROS                           Anesthesia Physical Anesthesia Plan  ASA: III  Anesthesia Plan: General   Post-op Pain Management:    Induction: Intravenous  Airway Management Planned: Oral ETT and Video Laryngoscope Planned  Additional Equipment:   Intra-op Plan:   Post-operative Plan: Extubation in OR  Informed Consent: I have reviewed the patients History and Physical, chart, labs and discussed the procedure including the risks, benefits and alternatives for the proposed anesthesia with the patient or authorized representative who has indicated his/her understanding and acceptance.   Dental advisory given  Plan Discussed with: CRNA and Surgeon  Anesthesia Plan Comments:        Anesthesia Quick Evaluation

## 2012-06-09 NOTE — Interval H&P Note (Signed)
History and Physical Interval Note:  06/09/2012 12:45 PM  Dillon Jones  has presented today for surgery, with the diagnosis of morbid obesity   The various methods of treatment have been discussed with the patient and family. After consideration of risks, benefits and other options for treatment, the patient has consented to  Procedure(s) (LRB) with comments: LAPAROSCOPIC ROUX-EN-Y GASTRIC (N/A) as a surgical intervention .  The patient's history has been reviewed, patient examined, no change in status, stable for surgery.  I have reviewed the patient's chart and labs.  Questions were answered to the patient's satisfaction.     Sabatino Williard B

## 2012-06-09 NOTE — Transfer of Care (Signed)
Immediate Anesthesia Transfer of Care Note  Patient: Dillon Jones  Procedure(s) Performed: Procedure(s) (LRB): LAPAROSCOPIC ROUX-EN-Y GASTRIC (N/A) LAPAROSCOPIC VENTRAL HERNIA ()  Patient Location: PACU  Anesthesia Type: General  Level of Consciousness: sedated, patient cooperative and responds to stimulaton  Airway & Oxygen Therapy: Patient Spontanous Breathing and Patient connected to face mask oxgen  Post-op Assessment: Report given to PACU RN and Post -op Vital signs reviewed and stable  Post vital signs: Reviewed and stable  Complications: No apparent anesthesia complications

## 2012-06-09 NOTE — Anesthesia Postprocedure Evaluation (Signed)
  Anesthesia Post-op Note  Patient: Dillon Jones  Procedure(s) Performed: Procedure(s) (LRB): LAPAROSCOPIC ROUX-EN-Y GASTRIC (N/A) LAPAROSCOPIC VENTRAL HERNIA ()  Patient Location: PACU  Anesthesia Type: General  Level of Consciousness: awake and alert   Airway and Oxygen Therapy: Patient Spontanous Breathing  Post-op Pain: mild  Post-op Assessment: Post-op Vital signs reviewed, Patient's Cardiovascular Status Stable, Respiratory Function Stable, Patent Airway and No signs of Nausea or vomiting  Last Vitals:  Filed Vitals:   06/09/12 1830  BP:   Pulse: 87  Temp:   Resp: 16    Post-op Vital Signs: stable   Complications: No apparent anesthesia complications

## 2012-06-09 NOTE — H&P (View-Only) (Signed)
Chief Complaint:  Morbid obesity BMI 55.8; weight 405 lbs  History of Present Illness:  Dillon Jones is an 61 y.o. male seen by me in August for Roux Y Gastric bypass surgery.  Did have some swelling in his right leg that may have been cellulitis. No DVT was found. He has lost down to a BMI of 55.8. We discussed his prior ventral hernia repair and he realizes that this could create adhesions which would preclude a gastric bypass. Hopefully that will not be the case. We will go ahead and do a bowel prep on him preoperatively and I discussed that with him. He signed his permit which I cosigned. He is aware of the risk of this procedure.  Past Medical History  Diagnosis Date  . Hyperlipidemia   . Hypertension   . Morbid obesity   . GERD (gastroesophageal reflux disease)     Occasional - only 1x q34m  . Sleep apnea   . Hypothyroidism   . Diabetes mellitus     Has had for 13 years, type 2  . Arthritis   . Anemia     Past Surgical History  Procedure Date  . Hernia repair 1992    umbilical  . Breath tek h pylori 04/22/2012    Procedure: BREATH TEK H PYLORI;  Surgeon: Valarie Merino, MD;  Location: [facility];  Service: General;  Laterality: N/A;  . Right knee arthroscopy 1999    Current Outpatient Prescriptions  Medication Sig Dispense Refill  . amLODipine (NORVASC) 10 MG tablet Take 10 mg by mouth daily before breakfast.       . glyBURIDE (DIABETA) 2.5 MG tablet Take 2.5 mg by mouth 2 (two) times daily with a meal.      . levothyroxine (SYNTHROID, LEVOTHROID) 200 MCG tablet Take 200 mcg by mouth daily before breakfast.       . losartan-hydrochlorothiazide (HYZAAR) 100-12.5 MG per tablet Take 1 tablet by mouth daily before breakfast.       . metFORMIN (GLUMETZA) 1000 MG (MOD) 24 hr tablet Take 1,000 mg by mouth 2 (two) times daily with a meal.      . Multiple Vitamin (MULTIVITAMIN WITH MINERALS) TABS Take 1 tablet by mouth daily.      . pioglitazone (ACTOS) 15 MG tablet Take 15 mg  by mouth daily before breakfast.       . simvastatin (ZOCOR) 40 MG tablet Take 20 mg by mouth daily before breakfast. Takes 1/2      . aspirin 81 MG tablet Take 81 mg by mouth daily.      Marland Kitchen ibuprofen (ADVIL,MOTRIN) 200 MG tablet Take 600 mg by mouth every 6 (six) hours as needed. Pain      . naproxen sodium (ANAPROX) 220 MG tablet Take 660 mg by mouth 2 (two) times daily with a meal.       Review of patient's allergies indicates no known allergies. Family History  Problem Relation Age of Onset  . Cancer Mother     breast  . Heart disease Father   . Diabetes Father   . Hypertension Father   . Diabetes Brother     2 brothers  . Hypertension Brother   . Diabetes Maternal Grandmother    Social History:   reports that he has never smoked. He has never used smokeless tobacco. He reports that he drinks alcohol. He reports that he does not use illicit drugs.   REVIEW OF SYSTEMS - PERTINENT POSITIVES ONLY: No history of  DVT  Physical Exam:   Blood pressure 136/82, pulse 80, temperature 97.6 F (36.4 C), temperature source Temporal, resp. rate 18, height 5' 11.5" (1.816 m), weight 405 lb 12.8 oz (184.07 kg). Body mass index is 55.81 kg/(m^2).  Gen:  WDWN white male NAD  Neurological: Alert and oriented to person, place, and time. Motor and sensory function is grossly intact  Head: Normocephalic and atraumatic.  Eyes: Conjunctivae are normal. Pupils are equal, round, and reactive to light. No scleral icterus.  Neck: Normal range of motion. Neck supple. No tracheal deviation or thyromegaly present.  Cardiovascular:  SR without murmurs or gallops.  No carotid bruits Respiratory: Effort normal.  No respiratory distress. No chest wall tenderness. Breath sounds normal.  No wheezes, rales or rhonchi.  Abdomen:  Transverse incision from prior ventral hernia repair. GU: Musculoskeletal: Normal range of motion. Extremities are nontender. No cyanosis, edema or clubbing noted Lymphadenopathy: No  cervical, preauricular, postauricular or axillary adenopathy is present Skin: Skin is warm and dry. No rash noted. No diaphoresis. No erythema. No pallor. Pscyh: Normal mood and affect. Behavior is normal. Judgment and thought content normal.   LABORATORY RESULTS: No results found for this or any previous visit (from the past 48 hour(s)).  RADIOLOGY RESULTS: No results found.  Problem List: Patient Active Problem List  Diagnosis  . Diabetes mellitus-type II  . OSA on CPAP  . Hypothyroidism  . Hypertension  . Hypercholesteremia  . Arthritis  . H/O umbilical hernia repair-mesh used; done in [place]    Assessment & Plan: Morbid obesity and a male with a BMI 55.8. Plan Roux-en-Y gastric bypass next Monday, December 30.    Matt B. Daphine Deutscher, MD, [facility] [phone] beeper [phone]  06/05/2012 11:12 AM

## 2012-06-09 NOTE — Brief Op Note (Signed)
06/09/2012  5:57 PM  PATIENT:  Dillon Jones  61 y.o. male  PRE-OPERATIVE DIAGNOSIS:  morbid obesity   POST-OPERATIVE DIAGNOSIS:  morbid obesity  PROCEDURE:  Procedure(s) (LRB) with comments: LAPAROSCOPIC ROUX-EN-Y GASTRIC (N/A)  SURGEON:  Surgeon(s) and Role:    * Valarie Merino, MD - Primary    * Mariella Saa, MD - Assisting  PHYSICIAN ASSISTANT:   ASSISTANTS: Jaclynn Guarneri, MD, FACS   ANESTHESIA:   general  EBL:  Total I/O In: 2000 [I.V.:2000] Out: 500 [Urine:500]  BLOOD ADMINISTERED:none  DRAINS: none   LOCAL MEDICATIONS USED:  MARCAINE     SPECIMEN:  No Specimen  DISPOSITION OF SPECIMEN:  N/A  COUNTS:  YES  TOURNIQUET:  * No tourniquets in log *  DICTATION: .Other Dictation: Dictation Number [id number]  PLAN OF CARE: Admit to inpatient   PATIENT DISPOSITION:  PACU - hemodynamically stable.   Delay start of Pharmacological VTE agent (>24hrs) due to surgical blood loss or risk of bleeding: yes

## 2012-06-09 NOTE — Progress Notes (Signed)
Dr. Okey Dupre made aware of patient's CBG results in PACU- 204- Insulin coverage to begin on 2 [place]

## 2012-06-10 ENCOUNTER — Encounter (HOSPITAL_COMMUNITY): Payer: Self-pay | Admitting: Surgery

## 2012-06-10 ENCOUNTER — Inpatient Hospital Stay (HOSPITAL_COMMUNITY): Payer: BC Managed Care – PPO

## 2012-06-10 DIAGNOSIS — Z09 Encounter for follow-up examination after completed treatment for conditions other than malignant neoplasm: Secondary | ICD-10-CM

## 2012-06-10 LAB — CBC WITH DIFFERENTIAL/PLATELET
Basophils Absolute: 0 10*3/uL (ref 0.0–0.1)
Eosinophils Absolute: 0 10*3/uL (ref 0.0–0.7)
Eosinophils Relative: 0 % (ref 0–5)
HCT: 37.1 % — ABNORMAL LOW (ref 39.0–52.0)
Lymphocytes Relative: 8 % — ABNORMAL LOW (ref 12–46)
MCH: 26.2 pg (ref 26.0–34.0)
MCHC: 32.6 g/dL (ref 30.0–36.0)
MCV: 80.5 fL (ref 78.0–100.0)
Monocytes Absolute: 0.7 10*3/uL (ref 0.1–1.0)
Platelets: 221 10*3/uL (ref 150–400)
RDW: 16.5 % — ABNORMAL HIGH (ref 11.5–15.5)

## 2012-06-10 LAB — GLUCOSE, CAPILLARY
Glucose-Capillary: 118 mg/dL — ABNORMAL HIGH (ref 70–99)
Glucose-Capillary: 119 mg/dL — ABNORMAL HIGH (ref 70–99)
Glucose-Capillary: 152 mg/dL — ABNORMAL HIGH (ref 70–99)

## 2012-06-10 LAB — HEMOGLOBIN A1C: Hgb A1c MFr Bld: 7.4 % — ABNORMAL HIGH (ref <5.7)

## 2012-06-10 MED ORDER — IOHEXOL 300 MG/ML  SOLN
50.0000 mL | Freq: Once | INTRAMUSCULAR | Status: AC | PRN
  Administered 2012-06-10: 50 mL via ORAL

## 2012-06-10 NOTE — Progress Notes (Signed)
Pt alert and oriented; VSS; CBG's trending downward; will continue to monitor; doppler study being done; UGI complete; awaiting results; denies any nausea or vomiting; denies burping or flatus; denies BM; foley intact; c/o some abdominal discomfort; denies need for prn meds at this time; encouraged to use as needed; ambulating in hallways without difficulty; incentive spirometer given and instructed on use and pt verbalized understanding of; pt already has follow up appts with [facility] and [facility]; aware of support group and BELT program; discharge instructions given for pt to review; will complete prior to discharge. GASTRIC BYPASS/SLEEVE DISCHARGE INSTRUCTIONS  Drs. Fredrik Rigger, Hoxworth, Wilson, and [place] Call if you have any problems.   Call [phone] and ask for the surgeon on call.    If you need immediate assistance come to the ER at [facility]. Tell the ER personnel that you are a new post-op gastric bypass patient. Signs and symptoms to report:   Severe vomiting or nausea. If you cannot tolerate clear liquids for longer than 1 day, you need to call your surgeon.    Abdominal pain which does not get better after taking your pain medication   Fever greater than 101 F degree   Difficulty breathing   Chest pain    Redness, swelling, drainage, or foul odor at incision sites    If your incisions open or pull apart   Swelling or pain in calf (lower leg)   Diarrhea, frequent watery, uncontrolled bowel movements.   Constipation, (no bowel movements for 3 days) if this occurs, Take Milk of Magnesia, 2 tablespoons by mouth, 3 times a day for 2 days if needed.  Call your doctor if constipation continues. Stop taking Milk of Magnesia once you have had a bowel movement. You may also use Miralax according to the label instructions.   Anything you consider "abnormal for you".   Normal side effects after Surgery:   Unable to sleep at night or concentrate   Irritability   Being tearful (crying) or  depressed   These are common complaints, possibly related to your anesthesia, stress of surgery and change in lifestyle, that usually go away a few weeks after surgery.  If these feelings continue, call your medical doctor.  Wound Care You may have surgical glue, steri-strips, or staples over your incisions after surgery.  Surgical glue:  Looks like a clear film over your incisions and will wear off gradually. Steri-strips: Strips of tape over your incisions. You may notice a yellowish color on the skin underneath the steri-strips. This is a substance used to make the steri-strips stick better. Do not pull the steri-strips off - let them fall off.  Staples: Cherlynn Polo may be removed before you leave the hospital. If you go home with staples, call [facility] [phone]) for an appointment with your surgeon's nurse to have staples removed in 7 - 10 days. Showering: You may shower two days after your surgery unless otherwise instructed by your surgeon. Wash gently around wounds with warm soapy water, rinse well, and gently pat dry.  If you have a drain, you may need someone to hold this while you shower. Avoid tub baths until staples are removed and incisions are healed.    Medications   Medications should be liquid or crushed if larger than the size of a dime.  Extended release pills should not be crushed.   Depending on the size and number of medications you take, you may need to stagger/change the time you take your medications so  that you do not over-fill your pouch.    Make sure you follow-up with your primary care physician to make medication adjustments needed during rapid weight loss and life-style adjustment.   If you are diabetic, follow up with the doctor that prescribes your diabetes medication(s) within one week after surgery and check your blood sugar regularly.   Do not drive while taking narcotics!   Do not take acetaminophen (Tylenol) and Roxicet or Lortab Elixir at the  same time since these pain medications contain acetaminophen.  Diet at home: (First 2 Weeks) You will see the nutritionist two weeks after your surgery. She will advance your diet if you are tolerating liquids well. Once at home, if you have severe vomiting or nausea and cannot tolerate clear liquids lasting longer than 1 day, call your surgeon.  Begin high protein shake 2 ounces every 3 hours, 5 - 6 times per day.  Gradually increase the amount you drink as tolerated.  You may find it easier to slowly sip shakes throughout the day.  It is important to get your proteins in first.   Protein Shake   Drink at least 2 ounces of shake 5-6 times per day   Each serving of protein shakes should have a minimum of 15 grams of protein and no more than 5 grams of carbohydrate    Increase the amount of protein shake you drink as tolerated   Protein powder may be added to fluids such as non-fat milk or Lactaid milk (limit to 20 grams added protein powder per serving   The initial goal is to drink at least 8 ounces of protein shake/drink per day (or as directed by the nutritionist). Some examples of protein shakes are [employer], [employer]illard[employer], EAS Edge HP, and Unjury. Hydration   Gradually increase the amount of water and other liquids as tolerated (See Acceptable Fluids)   Gradually increase the amount of protein shake as tolerated     Sip fluids slowly and throughout the day   May use Sugar substitutes, use sparingly (limit to 6 - 8 packets per day). Your fluid goal is 64 ounces of fluid daily. It may take a few weeks to build up to this.         32 oz (or more) should be clear liquids and 32 oz (or more) should be full liquids.         Liquids should not contain sugar, caffeine, or carbonation! Acceptable Fluids Clear Liquids:   Water or Sugar-free flavored water, Fruit H2O   Decaffeinated coffee or tea (sugar-free)   Crystal Lite, Wyler's Lite, Minute Maid Lite   Sugar-free Jell-O   Bouillon  or broth   Sugar-free Popsicle:   *Less than 20 calories each; Limit 1 per day   Full Liquids:              Protein Shakes/Drinks + 2 choices per day of other full liquids shown below.    Other full liquids must be: No more than 12 grams of Carbs per serving,  No more than 3 grams of Fat per serving   Strained low-fat cream soup   Non-Fat milk   Fat-free Lactaid Milk   Sugar-free yogurt (Dannon Lite & Fit) Vitamins and Minerals (Start 1 day after surgery unless otherwise directed)   2 Chewable Multivitamin / Multimineral Supplement (i.e. Centrum for Adults)   Chewable Calcium Citrate with Vitamin D-3. Take 1500 mg each day.           (  Example: 3 Chewable Calcium Plus 600 with Vitamin D-3 can be found at [facility])         Vitamin B-12, 350 - 500 micrograms (oral tablet) each day   Do not mix multivitamins containing iron with calcium supplements; take 2 hours   apart   Do not substitute Tums (calcium carbonate) for your calcium   Menstruating women and those at risk for anemia may need extra iron. Talk with your doctor to see if you need additional iron.    If you need extra iron:  Total daily Iron recommendations (including Vitamins) = 50 - 100 mg Iron/day Do not stop taking or change any vitamins or minerals until you talk to your nutritionist or surgeon. Your nutritionist and / or physician must approve all vitamin and mineral supplements. Exercise For maximum success, begin exercising as soon as your doctor recommends. Make sure your physician approves any physical activity.   Depending on fitness level, begin with a simple walking program   Walk 5-15 minutes each day, 7 days per week.    Slowly increase until you are walking 30-45 minutes per day   Consider joining our BELT program. [phone] or email belt@[employer] .edu Things to remember:    You may have sexual relations when you feel comfortable. It is VERY important for male patients to use a reliable birth control method. Fertility  often increases after surgery. Do not get pregnant for at least 18 months.   It is very important to keep all follow up appointments with your surgeon, nutritionist, primary care physician, and behavioral health practitioner. After the first year, please follow up with your bariatric surgeon at least once a year in order to maintain best weight loss results.  [facility]: [phone] [facility]: [phone]   Free counseling is available for you and your family through collaboration between [facility] and [place]. Please call [phone] and leave a message.    Consider purchasing a medical alert bracelet that says you had gastric bypass surgery.    The [facility] has a free Bariatric Surgery Support Group that meets monthly, the 3rd Thursday, 6 pm, Classroom #1, [place]. You may register online at [email or web address], but registration is not necessary. Select Classes and Support Groups, Bariatric Surgery, or Call [phone]   Do not return to work or drive until cleared by your surgeon   Use your CPAP when sleeping if applicable   Do not lift anything greater than ten pounds for at least two weeks  Joen Laura, RN Bariatric Nurse Coordinator

## 2012-06-10 NOTE — Progress Notes (Signed)
VASCULAR LAB PRELIMINARY  PRELIMINARY  PRELIMINARY  PRELIMINARY  Bilateral lower extremity venous duplex  completed.    Preliminary report:  Bilateral:  No evidence of DVT, superficial thrombosis, or Baker's Cyst.    Devony Mcgrady, RVT 06/10/2012, 9:50 AM

## 2012-06-10 NOTE — Op Note (Signed)
NAMEJAWAN, Dillon Jones NO.:  [id number]  MEDICAL RECORD NO.:  [id number]  LOCATION:  1237                         FACILITY:  [facility]  PHYSICIAN:  Thornton Park. Daphine Deutscher, MD  DATE OF BIRTH:  01-02-1951  DATE OF PROCEDURE:  06/09/2012 DATE OF DISCHARGE:                              OPERATIVE REPORT   PREOPERATIVE DIAGNOSIS:  Morbid obesity, body mass index near 60, with obstructive sleep apnea.  PROCEDURES:  Laparoscopic Roux-en-Y gastric bypass, endoscopy, repair of ventral hernia primarily.  SURGEON:  Thornton Park. Daphine Deutscher, MD  ASSISTANT:  Lorne Skeens. Hoxworth, MD  ANESTHESIA:  General endotracheal.  DESCRIPTION OF PROCEDURE:  The patient was taken to room 1, given general anesthesia.  We attempted to place a Foley catheter and found him to have both a phimosis related to his obesity and a meatal stenosis.  Dr. Bjorn Pippin was contacted and he came in and with retraction, we were able to identify the meatus and dilate it and place a Foley.  The abdomen was then prepped with PCMX and draped sterilely.  Access to the abdomen was achieved through the left upper quadrant using a 0 degree Optiview and placement of the scope inside.  The second port was placed on the left side through which I then took down massive amount of omentum that was stuck up into an old ventral incisional hernia repair they had, as we later discovered, placed some Gore-Tex mesh.  The omentum was then freed.  The ligament of Treitz was identified and it measured 20 cm downstream where I then divided it with brown Covidien 6 cm stapler.  Penrose was sewn to what would be the Roux Y limb and I measured a meter downstream and then brought the BP limb alongside the common channel and fired the stapler after tacking with suture.  The common defect was closed from either end with a 2-0 Vicryl, getting a secure closure.  Tisseel was applied.  The common defect was closed with running 2-0 silk.  Next we  divided the omentum with Harmonic scalpel. We did have some bleeding from the previous omental hernia.  We controlled that with the Harmonic.  Visualization was difficult in the foregut because of his large liver and the left lateral segment extended all the way over to his spleen. Nathanson retractor was inserted and I was only able to see about half of his liver.  I went ahead and measured down to about 6 cm and it was decided to make a long narrow pouch.  I dissected in from the lesser curve, firing 2 applications of 6 cm purple load.  The rest of firings were going up, creating a nice little sleeve with purple load using Peri strips.  Complete division of the stomach was performed.  The Roux Y was then brought up and a posterior row was sewn.  An opening was made along the right side of the stomach with __________ buttress and entered into that and then entered into the Roux limb candy cane. The candy cane was to the left.  I fired a 4.5 mm purple load to create the anastomosis and closed the common defect with 2 sutures  of 2-0 Vicryl.  Second layer of 2-0 Vicryl was hand-sewn with a stent of tube over the anastomosis.  Tisseel was applied.  Endoscopy was performed and no leaks were seen. No bubbles were seen and anastomosis was about 5-6 cm.  It was nice and narrow.  Patent anastomosis was present.  Petersen defect was closed with figure-of-eight suture of 2-0 silk.  The 2 little remnant hernias that were present from his previous umbilical hernia were noted to be where the Gore-Tex had pulled away from the fascia over on the left side.  These 2 little holes were closed with 0 Vicryl, passed using the Endo Close device, tying down with multiple knots and this tightened up these little holes.  All the wounds were injected with Exparel and closed with 4-0 Vicryl and staples.  The patient tolerated the procedure well.  The 2 little places where I tied down the knots were closed  with Dermabond.  The patient tolerated the procedure well and was taken to recovery room in satisfactory condition.     Thornton Park Daphine Deutscher, MD     MBM/MEDQ  D:  06/09/2012  T:  06/10/2012  Job:  [id number]

## 2012-06-10 NOTE — Progress Notes (Signed)
RT set patient up on home CPAP machine. Patient has home nasal pillows. RT called service response to come check machine and inspected all cords that looked in good working order. RN to get order for home CPAP machine.

## 2012-06-10 NOTE — Progress Notes (Signed)
Patient already asleep on home CPAP machine. RT talked to RN instructed her to call if any other assistance was needed.

## 2012-06-10 NOTE — Progress Notes (Signed)
CARE MANAGEMENT NOTE 06/10/2012  Patient:  TROYE, HIEMSTRA   Account Number:  [id number]  Date Initiated:  06/10/2012  Documentation initiated by:  Youa Deloney  Subjective/Objective Assessment:   pt had maj surg procedure on [id number], to sdu s/p due to morbid obsesity and resp, needs, transferred to med surg [id number].     Action/Plan:   home once stable   Anticipated DC Date:  06/13/2012   Anticipated DC Plan:  HOME/SELF CARE         Choice offered to / List presented to:             Status of service:  In process, will continue to follow Medicare Important Message given?  NA - LOS <3 / Initial given by admissions (If response is "NO", the following Medicare IM given date fields will be blank) Date Medicare IM given:   Date Additional Medicare IM given:    Discharge Disposition:    Per UR Regulation:  Reviewed for med. necessity/level of care/duration of stay  If discussed at Long Length of Stay Meetings, dates discussed:    Comments:  [id number] Earlene Plater, RN, BSN, CCM: CHART REVIEWED AND UPDATED.  Next chart review due on [id number]. NO DISCHARGE NEEDS PRESENT AT THIS TIME. CASE MANAGEMENT [phone]

## 2012-06-10 NOTE — Progress Notes (Signed)
Dr. Daphine Deutscher notified of UGI results; orders received to begin POD #1 diet; Gregary Signs, RN aware of results and orders. Joen Laura, RN BAriatric Nurse Coordinator

## 2012-06-10 NOTE — Progress Notes (Signed)
Patient ID: Dillon Jones, male   DOB: Oct 17, 1950, 61 y.o.   MRN: [id number] [facility] Surgery Progress Note:   1 Day Post-Op  Subjective: Mental status is clear.   Objective: Vital signs in last 24 hours: Temp:  [97.9 F (36.6 C)-98.5 F (36.9 C)] 98.3 F (36.8 C) (12/31 0000) Pulse Rate:  [73-94] 73  (12/31 0800) Resp:  [11-21] 17  (12/31 0800) BP: (134-171)/(61-77) 161/74 mmHg (12/31 0800) SpO2:  [94 %-100 %] 96 % (12/31 0800)  Intake/Output from previous day: 12/30 0701 - 12/31 0700 In: 3245 [I.V.:3245] Out: 1280 [Urine:1280] Intake/Output this shift:    Physical Exam: Work of breathing is normal.  Minimal pain. VSS  Lab Results:  Results for orders placed during the hospital encounter of 06/09/12 (from the past 48 hour(s))  CBC WITH DIFFERENTIAL     Status: Abnormal   Collection Time   06/09/12 10:20 AM      Component Value Range Comment   WBC 9.0  4.0 - 10.5 K/uL    RBC 5.01  4.22 - 5.81 MIL/uL    Hemoglobin 13.0  13.0 - 17.0 g/dL    HCT 40.9  81.1 - 91.4 %    MCV 79.2  78.0 - 100.0 fL    MCH 25.9 (*) 26.0 - 34.0 pg    MCHC 32.7  30.0 - 36.0 g/dL    RDW 78.2 (*) 95.6 - 15.5 %    Platelets 254  150 - 400 K/uL    Neutrophils Relative 79 (*) 43 - 77 %    Neutro Abs 7.1  1.7 - 7.7 K/uL    Lymphocytes Relative 14  12 - 46 %    Lymphs Abs 1.3  0.7 - 4.0 K/uL    Monocytes Relative 6  3 - 12 %    Monocytes Absolute 0.6  0.1 - 1.0 K/uL    Eosinophils Relative 1  0 - 5 %    Eosinophils Absolute 0.1  0.0 - 0.7 K/uL    Basophils Relative 0  0 - 1 %    Basophils Absolute 0.0  0.0 - 0.1 K/uL   COMPREHENSIVE METABOLIC PANEL     Status: Abnormal   Collection Time   06/09/12 10:20 AM      Component Value Range Comment   Sodium 136  135 - 145 mEq/L    Potassium 3.8  3.5 - 5.1 mEq/L    Chloride 99  96 - 112 mEq/L    CO2 23  19 - 32 mEq/L    Glucose, Bld 150 (*) 70 - 99 mg/dL    BUN 10  6 - 23 mg/dL    Creatinine, Ser 2.13  0.50 - 1.35 mg/dL    Calcium 9.4  8.4 -  10.5 mg/dL    Total Protein 8.1  6.0 - 8.3 g/dL    Albumin 3.7  3.5 - 5.2 g/dL    AST 20  0 - 37 U/L    ALT 20  0 - 53 U/L    Alkaline Phosphatase 59  39 - 117 U/L    Total Bilirubin 0.3  0.3 - 1.2 mg/dL    GFR calc non Af Amer >90  >90 mL/min    GFR calc Af Amer >90  >90 mL/min   GLUCOSE, CAPILLARY     Status: Abnormal   Collection Time   06/09/12 10:26 AM      Component Value Range Comment   Glucose-Capillary 147 (*) 70 - 99 mg/dL  Comment 1 Documented in Chart     HEMOGLOBIN A1C     Status: Abnormal   Collection Time   06/09/12 10:30 AM      Component Value Range Comment   Hemoglobin A1C 7.4 (*) <5.7 %    Mean Plasma Glucose 166 (*) <117 mg/dL   GLUCOSE, CAPILLARY     Status: Abnormal   Collection Time   06/09/12  6:08 PM      Component Value Range Comment   Glucose-Capillary 204 (*) 70 - 99 mg/dL    Comment 1 Notify RN      Comment 2 Documented in Chart     GLUCOSE, CAPILLARY     Status: Abnormal   Collection Time   06/09/12  8:08 PM      Component Value Range Comment   Glucose-Capillary 216 (*) 70 - 99 mg/dL   HEMOGLOBIN AND HEMATOCRIT, BLOOD     Status: Abnormal   Collection Time   06/09/12 10:26 PM      Component Value Range Comment   Hemoglobin 12.2 (*) 13.0 - 17.0 g/dL    HCT 40.9 (*) 81.1 - 52.0 %   GLUCOSE, CAPILLARY     Status: Abnormal   Collection Time   06/10/12 12:42 AM      Component Value Range Comment   Glucose-Capillary 184 (*) 70 - 99 mg/dL    Comment 1 Notify RN     CBC WITH DIFFERENTIAL     Status: Abnormal   Collection Time   06/10/12  3:45 AM      Component Value Range Comment   WBC 11.9 (*) 4.0 - 10.5 K/uL    RBC 4.61  4.22 - 5.81 MIL/uL    Hemoglobin 12.1 (*) 13.0 - 17.0 g/dL    HCT 91.4 (*) 78.2 - 52.0 %    MCV 80.5  78.0 - 100.0 fL    MCH 26.2  26.0 - 34.0 pg    MCHC 32.6  30.0 - 36.0 g/dL    RDW 95.6 (*) 21.3 - 15.5 %    Platelets 221  150 - 400 K/uL    Neutrophils Relative 87 (*) 43 - 77 %    Neutro Abs 10.4 (*) 1.7 - 7.7  K/uL    Lymphocytes Relative 8 (*) 12 - 46 %    Lymphs Abs 0.9  0.7 - 4.0 K/uL    Monocytes Relative 6  3 - 12 %    Monocytes Absolute 0.7  0.1 - 1.0 K/uL    Eosinophils Relative 0  0 - 5 %    Eosinophils Absolute 0.0  0.0 - 0.7 K/uL    Basophils Relative 0  0 - 1 %    Basophils Absolute 0.0  0.0 - 0.1 K/uL   GLUCOSE, CAPILLARY     Status: Abnormal   Collection Time   06/10/12  4:03 AM      Component Value Range Comment   Glucose-Capillary 163 (*) 70 - 99 mg/dL    Comment 1 Notify RN     GLUCOSE, CAPILLARY     Status: Abnormal   Collection Time   06/10/12  8:21 AM      Component Value Range Comment   Glucose-Capillary 118 (*) 70 - 99 mg/dL    Comment 1 Notify RN      Comment 2 Documented in Chart       Radiology/Results: Dg Ugi W/water Sol Cm  06/10/2012  *RADIOLOGY REPORT*  Clinical Data:  Postop gastric bypass surgery.  UPPER GI SERIES WITHOUT KUB  Technique:  Routine upper GI series was performed with 50 ml water soluble contrast.  Fluoroscopy Time: 0.45 minutes  Comparison:  None.  Findings: The distal esophagus is unremarkable.  No hiatal hernia. The gastric pouch appears normal.  No extravasation.  The gastrojejunal anastomoses is normal.  The proximal small bowel is unremarkable.  IMPRESSION: Normal postoperative upper GI status post gastric bypass surgery. No complicating features are demonstrated.   Original Report Authenticated By: Rudie Meyer, M.D.     Anti-infectives: Anti-infectives     Start     Dose/Rate Route Frequency Ordered Stop   06/09/12 0959   cefOXitin (MEFOXIN) 2 g in dextrose 5 % 50 mL IVPB        2 g 100 mL/hr over 30 Minutes Intravenous On call to O.R. 06/09/12 0959 06/09/12 1320          Assessment/Plan: Problem List: Patient Active Problem List  Diagnosis  . Diabetes mellitus-type II  . OSA on CPAP  . Hypothyroidism  . Hypertension  . Hypercholesteremia  . Arthritis  . H/O umbilical hernia repair-mesh used; done in [place]  . Morbid  obesity with BMI of 55.8  . Lap Roux Y Gastric Bypass Dec 2013    UGI OK.  Begin PD bariatric diet.  Transfer to floor.  Will leave foley in place.  1 Day Post-Op    LOS: 1 day   Matt B. Daphine Deutscher, MD, [facility] [phone] beeper [phone]  06/10/2012 10:53 AM

## 2012-06-10 NOTE — Op Note (Signed)
NAMEASHETON, SCHEFFLER NO.:  [id number]  MEDICAL RECORD NO.:  [id number]  LOCATION:  1237                         FACILITY:  [facility]  PHYSICIAN:  Excell Seltzer. Annabell Howells, M.D.    DATE OF BIRTH:  Aug 23, 1950  DATE OF PROCEDURE:  06/09/2012 DATE OF DISCHARGE:                              OPERATIVE REPORT   PROCEDURE:  Complicated Foley catheter placement, meatal dilation.  PREOPERATIVE DIAGNOSIS:  Buried penis with meatal stenosis.  POSTOPERATIVE DIAGNOSIS:  Buried penis with meatal stenosis.  SURGEON:  Excell Seltzer. Annabell Howells, MD  ASSISTANTS:  Dr. Glenna Fellows and Dr. Luretha Murphy.  ANESTHESIA:  General.  DRAINS:  A 16-French Foley catheter.  COMPLICATIONS:  None.  INDICATIONS:  Mr. Timothy is a 61 year old white male, who I was asked to see in consultation by Dr. Luretha Murphy for difficult Foley placement. The patient is morbidly obese white male, who is to undergo a gastric bypass procedure and at the time of attempted Foley placement, he was found have a phimotic foreskin and Dr. Daphine Deutscher was unable to get a catheter in and requested my assistant.  On exam, he was found to have a rather buried penis with tight phimotic ring and minimal residual penile skin suggesting a prior circumcision. I was able to initially visualize the urethral meatus, but the meatus appeared tight.  He was then prepped with Betadine solution and I used the tip of the finger to dilate the phimotic ring and then attempted to get the Foley into the urethral meatus with the finger, however, this was unsuccessful due to the tight meatus.  Then using Dr. Daphine Deutscher and Dr. Johna Sheriff as assistance with small retractors, we were able expose to the meatus which was then dilated gently with a Kelly clamp.  Once the urethral meatus had been dilated, a 16-French Foley catheter could be inserted.  This was advanced to the bladder.  Once in place, the balloon was filled with 10 mL of sterile fluid and  the catheter was placed to straight drainage with return of clear urine. There were no complications during this portion of procedure.     Excell Seltzer. Annabell Howells, M.D.     JJW/MEDQ  D:  06/09/2012  T:  06/10/2012  Job:  [id number]  cc:   [place] Daphine Deutscher, MD [address] [place] [address]

## 2012-06-10 NOTE — Progress Notes (Signed)
Started PO day 1 diet of 2oz water/4hr at [employer]. Pt passed swallow study. Pt lower extremity venous doppler study was negative. MD aware.  Langley Gauss, RN

## 2012-06-11 LAB — CBC WITH DIFFERENTIAL/PLATELET
Basophils Absolute: 0 10*3/uL (ref 0.0–0.1)
Eosinophils Absolute: 0.1 10*3/uL (ref 0.0–0.7)
Eosinophils Relative: 1 % (ref 0–5)
HCT: 36.5 % — ABNORMAL LOW (ref 39.0–52.0)
Lymphocytes Relative: 13 % (ref 12–46)
Lymphs Abs: 1.3 10*3/uL (ref 0.7–4.0)
MCH: 26 pg (ref 26.0–34.0)
MCV: 81.1 fL (ref 78.0–100.0)
Monocytes Absolute: 0.7 10*3/uL (ref 0.1–1.0)
Platelets: 188 10*3/uL (ref 150–400)
RDW: 17 % — ABNORMAL HIGH (ref 11.5–15.5)
WBC: 10 10*3/uL (ref 4.0–10.5)

## 2012-06-11 LAB — GLUCOSE, CAPILLARY
Glucose-Capillary: 124 mg/dL — ABNORMAL HIGH (ref 70–99)
Glucose-Capillary: 118 mg/dL — ABNORMAL HIGH (ref 70–99)
Glucose-Capillary: 117 mg/dL — ABNORMAL HIGH (ref 70–99)

## 2012-06-11 MED ORDER — OXYCODONE-ACETAMINOPHEN 5-325 MG/5ML PO SOLN: 5.0000 mL | ORAL | Status: DC | PRN

## 2012-06-11 NOTE — Progress Notes (Signed)
Pt alert and oriented; VSS; CBG's continue to trend downward; denies any nausea or vomiting; tolerating water well; will advance to protein shakes; + burping and +flatus; +BM; voiding without difficulty; ambulating  In hallways without difficulty; denies need for prn meds; c/o some abdominal discomfort; using incentive spirometer as directed; foley removed this am; pt already has follow up appts with [facility] and [facility]; aware of support group and BELT program; discharge instructions reviewed and pt verbalized understanding of; questions answered.  GASTRIC BYPASS/SLEEVE DISCHARGE INSTRUCTIONS  Drs. Fredrik Rigger, Hoxworth, Wilson, and Minkler Call if you have any problems.   Call [phone] and ask for the surgeon on call.    If you need immediate assistance come to the ER at [facility]. Tell the ER personnel that you are a new post-op gastric bypass patient. Signs and symptoms to report:   Severe vomiting or nausea. If you cannot tolerate clear liquids for longer than 1 day, you need to call your surgeon.    Abdominal pain which does not get better after taking your pain medication   Fever greater than 101 F degree   Difficulty breathing   Chest pain    Redness, swelling, drainage, or foul odor at incision sites    If your incisions open or pull apart   Swelling or pain in calf (lower leg)   Diarrhea, frequent watery, uncontrolled bowel movements.   Constipation, (no bowel movements for 3 days) if this occurs, Take Milk of Magnesia, 2 tablespoons by mouth, 3 times a day for 2 days if needed.  Call your doctor if constipation continues. Stop taking Milk of Magnesia once you have had a bowel movement. You may also use Miralax according to the label instructions.   Anything you consider "abnormal for you".   Normal side effects after Surgery:   Unable to sleep at night or concentrate   Irritability   Being tearful (crying) or depressed   These are common complaints, possibly related to your  anesthesia, stress of surgery and change in lifestyle, that usually go away a few weeks after surgery.  If these feelings continue, call your medical doctor.  Wound Care You may have surgical glue, steri-strips, or staples over your incisions after surgery.  Surgical glue:  Looks like a clear film over your incisions and will wear off gradually. Steri-strips: Strips of tape over your incisions. You may notice a yellowish color on the skin underneath the steri-strips. This is a substance used to make the steri-strips stick better. Do not pull the steri-strips off - let them fall off.  Staples: Cherlynn Polo may be removed before you leave the hospital. If you go home with staples, call [facility] [phone]) for an appointment with your surgeon's nurse to have staples removed in 7 - 10 days. Showering: You may shower two days after your surgery unless otherwise instructed by your surgeon. Wash gently around wounds with warm soapy water, rinse well, and gently pat dry.  If you have a drain, you may need someone to hold this while you shower. Avoid tub baths until staples are removed and incisions are healed.    Medications   Medications should be liquid or crushed if larger than the size of a dime.  Extended release pills should not be crushed.   Depending on the size and number of medications you take, you may need to stagger/change the time you take your medications so that you do not over-fill your pouch.    Make sure you  follow-up with your primary care physician to make medication adjustments needed during rapid weight loss and life-style adjustment.   If you are diabetic, follow up with the doctor that prescribes your diabetes medication(s) within one week after surgery and check your blood sugar regularly.   Do not drive while taking narcotics!   Do not take acetaminophen (Tylenol) and Roxicet or Lortab Elixir at the same time since these pain medications contain  acetaminophen.  Diet at home: (First 2 Weeks) You will see the nutritionist two weeks after your surgery. She will advance your diet if you are tolerating liquids well. Once at home, if you have severe vomiting or nausea and cannot tolerate clear liquids lasting longer than 1 day, call your surgeon.  Begin high protein shake 2 ounces every 3 hours, 5 - 6 times per day.  Gradually increase the amount you drink as tolerated.  You may find it easier to slowly sip shakes throughout the day.  It is important to get your proteins in first.   Protein Shake   Drink at least 2 ounces of shake 5-6 times per day   Each serving of protein shakes should have a minimum of 15 grams of protein and no more than 5 grams of carbohydrate    Increase the amount of protein shake you drink as tolerated   Protein powder may be added to fluids such as non-fat milk or Lactaid milk (limit to 20 grams added protein powder per serving   The initial goal is to drink at least 8 ounces of protein shake/drink per day (or as directed by the nutritionist). Some examples of protein shakes are [employer], [employer], EAS Edge HP, and [employer]jury. Hydration   Gradually increase the amount of water and other liquids as tolerated (See Acceptable Fluids)   Gradually increase the amount of protein shake as tolerated     Sip fluids slowly and throughout the day   May use Sugar substitutes, use sparingly (limit to 6 - 8 packets per day). Your fluid goal is 64 ounces of fluid daily. It may take a few weeks to build up to this.         32 oz (or more) should be clear liquids and 32 oz (or more) should be full liquids.         Liquids should not contain sugar, caffeine, or carbonation! Acceptable Fluids Clear Liquids:   Water or Sugar-free flavored water, Fruit H2O   Decaffeinated coffee or tea (sugar-free)   Crystal Lite, Wyler's Lite, Minute Maid Lite   Sugar-free Jell-O   Bouillon or broth   Sugar-free Popsicle:   *Less than 20  calories each; Limit 1 per day   Full Liquids:              Protein Shakes/Drinks + 2 choices per day of other full liquids shown below.    Other full liquids must be: No more than 12 grams of Carbs per serving,  No more than 3 grams of Fat per serving   Strained low-fat cream soup   Non-Fat milk   Fat-free Lactaid Milk   Sugar-free yogurt (Dannon Lite & Fit) Vitamins and Minerals (Start 1 day after surgery unless otherwise directed)   2 Chewable Multivitamin / Multimineral Supplement (i.e. Centrum for Adults)   Chewable Calcium Citrate with Vitamin D-3. Take 1500 mg each day.           (Example: 3 Chewable Calcium Plus 600 with Vitamin D-3 can be found  at [facility])         Vitamin B-12, 350 - 500 micrograms (oral tablet) each day   Do not mix multivitamins containing iron with calcium supplements; take 2 hours   apart   Do not substitute Tums (calcium carbonate) for your calcium   Menstruating women and those at risk for anemia may need extra iron. Talk with your doctor to see if you need additional iron.    If you need extra iron:  Total daily Iron recommendations (including Vitamins) = 50 - 100 mg Iron/day Do not stop taking or change any vitamins or minerals until you talk to your nutritionist or surgeon. Your nutritionist and / or physician must approve all vitamin and mineral supplements. Exercise For maximum success, begin exercising as soon as your doctor recommends. Make sure your physician approves any physical activity.   Depending on fitness level, begin with a simple walking program   Walk 5-15 minutes each day, 7 days per week.    Slowly increase until you are walking 30-45 minutes per day   Consider joining our BELT program. [phone] or email belt@[employer] .edu Things to remember:    You may have sexual relations when you feel comfortable. It is VERY important for male patients to use a reliable birth control method. Fertility often increases after surgery. Do not get  pregnant for at least 18 months.   It is very important to keep all follow up appointments with your surgeon, nutritionist, primary care physician, and behavioral health practitioner. After the first year, please follow up with your bariatric surgeon at least once a year in order to maintain best weight loss results.  [facility]: [phone] [facility]: [phone]   Free counseling is available for you and your family through collaboration between [facility] and [place]. Please call [phone] and leave a message.    Consider purchasing a medical alert bracelet that says you had gastric bypass surgery.    [facility] has a free Bariatric Surgery Support Group that meets monthly, the 3rd Thursday, 6 pm, Classroom #1, [employer]. You may register online at [email or web address], but registration is not necessary. Select Classes and Support Groups, Bariatric Surgery, or Call [phone]   Do not return to work or drive until cleared by your surgeon   Use your CPAP when sleeping if applicable   Do not lift anything greater than ten pounds for at least two weeks  Joen Laura, RN Bariatric Nurse Coordinator

## 2012-06-11 NOTE — Discharge Summary (Signed)
Physician Discharge Summary  Patient ID: Dillon Jones MRN: [id number] DOB/AGE: [birth year] 62 y.o.  Admit date: 06/09/2012 Discharge date: 06/11/2012  Admission Diagnoses:  Morbid obesity  Discharge Diagnoses:  same  Active Problems:  Lap Roux Y Gastric Bypass Dec 2013   Surgery:  Laparoscopic roux en Y gastric bypass with lap closure of recurrent incarcerated ventral hernia  Discharged Condition: stable and improved  Hospital Course:   Had surgery and kept in stepdown for first day.  Foley left in place because of meatal stenosis and phimosis requiring Dr. Annabell Howells of urology to place Foley. Foley removed on PD 2 and made ready for discharge  Consults: Wrenn intra operative  Significant Diagnostic Studies: UGI    Discharge Exam: Blood pressure 164/81, pulse 80, temperature 98.6 F (37 C), temperature source Oral, resp. rate 20, height 5\' 11"  (1.803 m), weight 397 lb 2 oz (180.135 kg), SpO2 94.00%. Incisions bland with staples on trocar sites and Dermabond on the sites where the ventral herniae were repaired.   Disposition: 01-Home or Self Care  Discharge Orders    Future Appointments: Provider: Department: Dept Phone: Center:   06/24/2012 4:00 PM Ndm-[facility] Post-Op Class [facility] [phone] NDM   06/25/2012 2:00 PM Valarie Merino, MD [facility], [place] [phone] None     Future Orders Please Complete By Expires   Diet - low sodium heart healthy      Diet Carb Modified      Increase activity slowly      Discharge instructions      Comments:   Come by [facility] office on Monday for staple removal   Discharge wound care:      Comments:   Staple removal on Monday.  May apply Neosporin ointment to incisions and leave open   Call MD for:  persistant nausea and vomiting          Medication List     As of 06/11/2012  8:16 AM    STOP taking these medications         ibuprofen 200 MG tablet   Commonly known as: ADVIL,MOTRIN       naproxen sodium 220 MG tablet   Commonly known as: ANAPROX      TAKE these medications         amLODipine 10 MG tablet   Commonly known as: NORVASC   Take 10 mg by mouth daily before breakfast.      aspirin 81 MG tablet   Take 81 mg by mouth daily.      glyBURIDE 2.5 MG tablet   Commonly known as: DIABETA   Take 2.5 mg by mouth 2 (two) times daily with a meal.      levothyroxine 200 MCG tablet   Commonly known as: SYNTHROID, LEVOTHROID   Take 200 mcg by mouth daily before breakfast.      losartan-hydrochlorothiazide 100-12.5 MG per tablet   Commonly known as: HYZAAR   Take 1 tablet by mouth daily before breakfast.      metFORMIN 1000 MG (MOD) 24 hr tablet   Commonly known as: GLUMETZA   Take 1,000 mg by mouth 2 (two) times daily with a meal.      multivitamin with minerals Tabs   Take 1 tablet by mouth daily.      oxyCODONE-acetaminophen 5-325 MG/5ML solution   Commonly known as: ROXICET   Take 5-10 mLs by mouth every 4 (four) hours as needed.  pioglitazone 15 MG tablet   Commonly known as: ACTOS   Take 15 mg by mouth daily before breakfast.      simvastatin 40 MG tablet   Commonly known as: ZOCOR   Take 20 mg by mouth daily before breakfast. Takes  1/2           Follow-up Information    Follow up with Valarie Merino, MD. (previously made appointment)    Contact information:   [address] [place] [address] [phone]          Signed: Valarie Merino 06/11/2012, 8:16 AM

## 2012-06-11 NOTE — Progress Notes (Signed)
Discharged instructions reviewed with patient, questions and concerns answered, prescription given, patient to follow up with MD for staple removal Means, Myrtie Hawk RN 06-11-2012 13:44pm

## 2012-06-12 NOTE — Care Management Note (Signed)
    Page 1 of 1   06/12/2012     12:02:56 PM   CARE MANAGEMENT NOTE 06/12/2012  Patient:  ZUHAYR, DEENEY   Account Number:  [id number]  Date Initiated:  06/10/2012  Documentation initiated by:  DAVIS,RHONDA  Subjective/Objective Assessment:   pt had maj surg procedure on [id number], to sdu s/p due to morbid obsesity and resp, needs, transferred to med surg [id number].     Action/Plan:   home once stable   Anticipated DC Date:  06/13/2012   Anticipated DC Plan:  HOME/SELF CARE         Choice offered to / List presented to:             Status of service:  Completed, signed off Medicare Important Message given?  NA - LOS <3 / Initial given by admissions (If response is "NO", the following Medicare IM given date fields will be blank) Date Medicare IM given:   Date Additional Medicare IM given:    Discharge Disposition:  HOME/SELF CARE  Per UR Regulation:  Reviewed for med. necessity/level of care/duration of stay  If discussed at Long Length of Stay Meetings, dates discussed:    Comments:  [id number] Earlene Plater, RN, BSN, CCM: CHART REVIEWED AND UPDATED.  Next chart review due on [id number]. NO DISCHARGE NEEDS PRESENT AT THIS TIME. CASE MANAGEMENT [phone]

## 2012-06-13 MED FILL — Acetaminophen IV Soln 10 MG/ML: INTRAVENOUS | Qty: 100 | Status: AC

## 2012-06-16 ENCOUNTER — Ambulatory Visit (INDEPENDENT_AMBULATORY_CARE_PROVIDER_SITE_OTHER): Payer: BC Managed Care – PPO | Admitting: General Surgery

## 2012-06-16 ENCOUNTER — Encounter (INDEPENDENT_AMBULATORY_CARE_PROVIDER_SITE_OTHER): Payer: Self-pay | Admitting: General Surgery

## 2012-06-16 VITALS — BP 132/76 | HR 88 | Temp 97.4°F | Resp 16 | Ht 71.0 in | Wt 395.8 lb

## 2012-06-16 DIAGNOSIS — Z4802 Encounter for removal of sutures: Secondary | ICD-10-CM

## 2012-06-24 ENCOUNTER — Encounter: Payer: BC Managed Care – PPO | Attending: Surgery | Admitting: *Deleted

## 2012-06-24 ENCOUNTER — Encounter: Payer: Self-pay | Admitting: *Deleted

## 2012-06-24 VITALS — Ht 71.0 in | Wt 385.5 lb

## 2012-06-24 DIAGNOSIS — Z713 Dietary counseling and surveillance: Secondary | ICD-10-CM | POA: Insufficient documentation

## 2012-06-24 DIAGNOSIS — Z01818 Encounter for other preprocedural examination: Secondary | ICD-10-CM | POA: Insufficient documentation

## 2012-06-24 DIAGNOSIS — Z6841 Body Mass Index (BMI) 40.0 and over, adult: Secondary | ICD-10-CM

## 2012-06-24 NOTE — Patient Instructions (Addendum)
Patient to follow Phase 3A-Soft, High Protein Diet and follow-up at [facility] in 6 weeks for 2 months post-op nutrition visit for diet advancement. 

## 2012-06-24 NOTE — Progress Notes (Signed)
Bariatric Class:  Appt start time: 1600   End time: 1700.  2 Week Post-Operative Nutrition Class  Patient was seen on 06/24/2012 for Post-Operative Nutrition education at [facility].   Surgery date: 06/09/12  Surgery type: RYGB  Start weight at [facility]: 429.3 lbs (02/18/12)   Weight today: 384.5 lbs Weight change: 30.9 lbs Total weight lost: 44.8 lbs  Goal weight: 200 lbs  % goal met:  24%  TANITA  BODY COMP RESULTS  05/15/12 06/24/12   BMI (kg/m^2) 57.1 53.6   Fat Mass (lbs) -- 247.0   Fat Free Mass (lbs) -- 137.5   Total Body Water (lbs) -- 100.5   The following the learning objectives were met by the patient during this course:  Identifies Phase 3A (Soft, High Proteins) Dietary Goals and will begin from 2 weeks post-operatively to 2 months post-operatively  Identifies appropriate sources of fluids and proteins   States protein recommendations and appropriate sources post-operatively  Identifies the need for appropriate texture modifications, mastication, and bite sizes when consuming solids  Identifies appropriate multivitamin and calcium sources post-operatively  Describes the need for physical activity post-operatively and will follow MD recommendations  States when to call healthcare provider regarding medication questions or post-operative complications  Handouts given during visit include:  Phase 3A: Soft, High Protein Diet Handout  Samples given during visit include:   VerioIQ Meter: 1 Starter Kit Lot # [id number]; Exp: 08/14  VerioIQ Strips: 2 boxes; 10 strips/box Lot # [id number]; Exp: 02/14 - alerted pt to upcoming expiration date Lot # [id number]; Exp: 04/14  Follow-Up Plan: Patient will follow-up at [facility] in 6 weeks for 2 months post-op nutrition visit for diet advancement per MD.

## 2012-06-25 ENCOUNTER — Telehealth (INDEPENDENT_AMBULATORY_CARE_PROVIDER_SITE_OTHER): Payer: Self-pay

## 2012-06-25 ENCOUNTER — Ambulatory Visit (INDEPENDENT_AMBULATORY_CARE_PROVIDER_SITE_OTHER): Payer: BC Managed Care – PPO | Admitting: Surgery

## 2012-06-25 NOTE — Telephone Encounter (Signed)
Attempted to contact pt before he made it to the office to let him know that MM is running late and to see if he would like to r/s his appt.  Unfortunately, he arrived at the office as the phone was ringing.  I never left a message b/c I went to the lobby and explained the situation with the pt.  He stated that he would like to r/s.  Appt has been r/s from 1/15 @2p  to 1/16 @ 910a.

## 2012-06-26 ENCOUNTER — Ambulatory Visit (INDEPENDENT_AMBULATORY_CARE_PROVIDER_SITE_OTHER): Payer: BC Managed Care – PPO | Admitting: Surgery

## 2012-06-27 ENCOUNTER — Telehealth (INDEPENDENT_AMBULATORY_CARE_PROVIDER_SITE_OTHER): Payer: Self-pay

## 2012-06-27 NOTE — Telephone Encounter (Signed)
Spoke with pt in regards to his new appt date and time - Fri, Jan 31 @11 :20a.  He was questioning if he can resume driving.  I told him that I would text MM with his question and get back to him as soon as I have an answer.

## 2012-06-27 NOTE — Telephone Encounter (Signed)
LMOM for pt in regards to his earlier question about resuming driving.  Per MM pt can drive once no longer taking narcotics for pain.

## 2012-06-30 ENCOUNTER — Ambulatory Visit (INDEPENDENT_AMBULATORY_CARE_PROVIDER_SITE_OTHER): Payer: BC Managed Care – PPO | Admitting: Surgery

## 2012-06-30 ENCOUNTER — Encounter (INDEPENDENT_AMBULATORY_CARE_PROVIDER_SITE_OTHER): Payer: Self-pay | Admitting: Surgery

## 2012-06-30 VITALS — BP 138/84 | HR 86 | Temp 97.2°F | Resp 18 | Ht 71.0 in | Wt 376.6 lb

## 2012-06-30 DIAGNOSIS — Z9884 Bariatric surgery status: Secondary | ICD-10-CM

## 2012-06-30 NOTE — Progress Notes (Signed)
Dillon Jones 62 y.o.  Body mass index is 52.53 kg/(m^2).  Patient Active Problem List  Diagnosis  . Diabetes mellitus-type II  . OSA on CPAP  . Hypothyroidism  . Hypertension  . Hypercholesteremia  . Arthritis  . H/O umbilical hernia repair-mesh used; done in [place]  . Morbid obesity with BMI of 55.8  . Lap Roux Y Gastric Bypass Dec 2013    No Known Allergies  Past Surgical History  Procedure Date  . Hernia repair 1992    umbilical  . Breath tek h pylori 04/22/2012    Procedure: BREATH TEK H PYLORI;  Surgeon: Valarie Merino, MD;  Location: [facility];  Service: General;  Laterality: N/A;  . Right knee arthroscopy 1999  . Gastric roux-en-y 06/09/2012    Procedure: LAPAROSCOPIC ROUX-EN-Y GASTRIC;  Surgeon: Valarie Merino, MD;  Location: [facility];  Service: General;  Laterality: N/A;  . Ventral hernia repair 06/09/2012    Procedure: LAPAROSCOPIC VENTRAL HERNIA;  Surgeon: Valarie Merino, MD;  Location: [facility];  Service: General;;  primary repair of ventral hernia   [name], MD No diagnosis found.  Doing very well after gastric bypass.  His DM is better controlled and he is down to metformin only.  (off Actos and Glyburide).  Incisions including the hernia repair site appear ok.   We had a long talk about pain relievers for his knee arthritis.  I said Celebrex and NSAIDS are not recommended. Use of Voltaren gel is OK.  Will see him back in 8 weeks.    Matt B. Daphine Deutscher, MD, [facility] [phone] beeper [phone]  06/30/2012 3:31 PM

## 2012-06-30 NOTE — Patient Instructions (Signed)
Thanks for your patience.  If you need further assistance after leaving the office, please call our office and speak with a [facility] nurse.  [phone].  If you want to leave a message for Dr. Denaly Gatling, please call his office phone at [phone]. 

## 2012-07-11 ENCOUNTER — Ambulatory Visit (INDEPENDENT_AMBULATORY_CARE_PROVIDER_SITE_OTHER): Payer: BC Managed Care – PPO | Admitting: Surgery

## 2012-08-05 ENCOUNTER — Encounter: Payer: Self-pay | Admitting: *Deleted

## 2012-08-05 ENCOUNTER — Encounter: Payer: BC Managed Care – PPO | Attending: Surgery | Admitting: *Deleted

## 2012-08-05 VITALS — Ht 70.0 in | Wt 348.5 lb

## 2012-08-05 DIAGNOSIS — Z713 Dietary counseling and surveillance: Secondary | ICD-10-CM | POA: Insufficient documentation

## 2012-08-05 DIAGNOSIS — Z01818 Encounter for other preprocedural examination: Secondary | ICD-10-CM | POA: Insufficient documentation

## 2012-08-05 DIAGNOSIS — Z6841 Body Mass Index (BMI) 40.0 and over, adult: Secondary | ICD-10-CM

## 2012-08-05 NOTE — Patient Instructions (Addendum)
Goals:  Follow Phase 3B: High Protein + Non-Starchy Vegetables  Eat 3-6 small meals/snacks, every 3-5 hrs  Increase lean protein foods to meet 80g goal  Increase fluid intake to 64oz +  Avoid drinking 15 minutes before, during and 30 minutes after eating  Aim for >30 min of physical activity daily 

## 2012-08-05 NOTE — Progress Notes (Signed)
  Follow-up visit:  8 Weeks Post-Operative RYGB Surgery  Medical Nutrition Therapy:  Appt start time: 1145  End time:  1215.  Primary concerns today: Post-operative Bariatric Surgery Nutrition Management.  Surgery date: 06/09/12  Surgery type: RYGB  Start weight at [facility]: 429.3 lbs (02/18/12)   Weight today: 348.5 lbs Weight change: 36.0 lbs Total weight lost: 80.8 lbs  Goal weight: 200 lbs  % goal met:  35%  TANITA  BODY COMP RESULTS  05/15/12 06/24/12 08/05/12   BMI (kg/m^2) 57.1 53.6 48.6   Fat Mass (lbs) -- 247.0 232.5   Fat Free Mass (lbs) -- 137.5 116.0   Total Body Water (lbs) -- 100.5 85.0   24-hr recall: B (AM): Protein shake (21g) Snk (AM): Yogurt Dannon [place] w/ small amt of granola (13g)  L (PM): 1/2 cup chili OR chicken salad w/ light mayo  Snk (PM): Protein shake (21g)  D (PM): Same as lunch Snk (PM): Pc of cheese or some cottage cheese  Fluid intake:  64-90 oz Estimated total protein intake: > 60 g  Medications: No changes reported. Off all DM meds Supplementation: Taking regularly  CBG monitoring: daily Average FBG per patient: 110-120 mg Last patient reported A1c: 7.4% (06/09/12)  Using straws: No Drinking while eating: No, but only waits 10 min after eating.  Hair loss: No Carbonated beverages: No N/V/D/C: Constipation and diarrhea; no trends noted on type of food Dumping syndrome: No  Recent physical activity:  Walking until steroid shots wore off in knees.  Hip pain also reported. Seeing orthopedist on Monday.   Progress Towards Goal(s):  In progress.  Handouts given during visit include:  Phase 3B: High Protein + Non-Starchy Veggies   Nutritional Diagnosis:  [place]-3.3 Overweight/obesity related to past poor dietary habits and physical inactivity as evidenced by patient w/ recent RYGB surgery following dietary guidelines for continued weight loss.    Intervention:  Nutrition education.  Monitoring/Evaluation:  Dietary intake, exercise, and body  weight. Follow up in 1 months for 3 month post-op visit.

## 2012-08-29 ENCOUNTER — Ambulatory Visit (INDEPENDENT_AMBULATORY_CARE_PROVIDER_SITE_OTHER): Payer: BC Managed Care – PPO | Admitting: Surgery

## 2012-08-29 ENCOUNTER — Encounter (INDEPENDENT_AMBULATORY_CARE_PROVIDER_SITE_OTHER): Payer: Self-pay | Admitting: Surgery

## 2012-08-29 VITALS — BP 132/84 | HR 76 | Temp 97.6°F | Resp 16 | Ht 71.5 in | Wt 335.0 lb

## 2012-08-29 DIAGNOSIS — Z9884 Bariatric surgery status: Secondary | ICD-10-CM

## 2012-08-29 NOTE — Patient Instructions (Addendum)
Continue your diet and exercise.  Will check vitamin levels and labs at 6 months postop.  This could be done in Dr. Marya Amsler office

## 2012-08-29 NOTE — Progress Notes (Signed)
Dillon Jones 62 y.o.  Body mass index is 46.08 kg/(m^2).  Patient Active Problem List  Diagnosis  . Diabetes mellitus-type II  . OSA on CPAP  . Hypothyroidism  . Hypertension  . Hypercholesteremia  . Arthritis  . H/O umbilical hernia repair-mesh used; done in [place]  . Morbid obesity with BMI of 55.8  . Lap Roux Y Gastric Bypass Dec 2013    No Known Allergies  Past Surgical History  Procedure Laterality Date  . Hernia repair  1992    umbilical  . Breath tek h pylori  04/22/2012    Procedure: BREATH TEK H PYLORI;  Surgeon: Valarie Merino, MD;  Location: [facility];  Service: General;  Laterality: N/A;  . Right knee arthroscopy  1999  . Gastric roux-en-y  06/09/2012    Procedure: LAPAROSCOPIC ROUX-EN-Y GASTRIC;  Surgeon: Valarie Merino, MD;  Location: [facility];  Service: General;  Laterality: N/A;  . Ventral hernia repair  06/09/2012    Procedure: LAPAROSCOPIC VENTRAL HERNIA;  Surgeon: Valarie Merino, MD;  Location: [facility];  Service: General;;  primary repair of ventral hernia   [name], MD No diagnosis found.  Doing well 3 months out from his lap Roux-en-Y gastric bypass. Incisions are okay. Not having any abdominal pain. Has lost 70 pounds since surgery. He was recently taken off his amlodipine his he had some dizziness. I'll see him again in 3 months. At that time he will be about 6 months out from his surgery and we probably ought to check lab and vitamin levels then. I mentioned that he could either have that done at Dr. Delanna Ahmadi office or we would order that on his next visit. In the meantime he's not having any symptoms of vitamin deficiency and is taking his supplements.  Return 3 months Matt B. Daphine Deutscher, MD, [facility] [phone] beeper [phone]  08/29/2012 10:02 AM

## 2012-09-02 ENCOUNTER — Encounter: Payer: BC Managed Care – PPO | Attending: Surgery | Admitting: *Deleted

## 2012-09-02 ENCOUNTER — Encounter: Payer: Self-pay | Admitting: *Deleted

## 2012-09-02 VITALS — Ht 71.0 in | Wt 332.0 lb

## 2012-09-02 DIAGNOSIS — Z713 Dietary counseling and surveillance: Secondary | ICD-10-CM | POA: Insufficient documentation

## 2012-09-02 DIAGNOSIS — Z01818 Encounter for other preprocedural examination: Secondary | ICD-10-CM | POA: Insufficient documentation

## 2012-09-02 NOTE — Patient Instructions (Addendum)
Goals:  Continue to follow Phase 3B: High Protein + Non-Starchy Vegetables  Decrease protein intake to meet 80-90g goal.  Continue fluid intake of 64oz +  Add 15 grams of carbohydrate (fruit, whole grains) with meals  Avoid drinking 15 minutes before, during and 30 minutes after eating  Aim for >30 min of physical activity daily  Try no snack at bedtime to help regulate fasting blood sugars

## 2012-09-02 NOTE — Progress Notes (Signed)
  Follow-up visit:  12 Weeks Post-Operative RYGB Surgery  Medical Nutrition Therapy:  Appt start time: 1530  End time:  1600.  Primary concerns today: Post-operative Bariatric Surgery Nutrition Management.  Surgery date: 06/09/12  Surgery type: RYGB  Start weight at [facility]: 429.3 lbs (02/18/12)   Weight today: 332.0 lbs Weight change: 16.5 lbs Total weight lost: 97.3 lbs  Goal weight: 200 lbs  % goal met:  42%  TANITA  BODY COMP RESULTS  05/15/12 06/24/12 08/05/12 09/02/12   BMI (kg/m^2) 57.1 53.6 48.6  46.3   Fat Mass (lbs) -- 247.0 232.5 206.5   Fat Free Mass (lbs) -- 137.5 116.0 125.5   Total Body Water (lbs) -- 100.5 85.0 92.0   24-hr recall: B (9-9:30 AM): 4 oz Protein shake (25g) Snk (AM):  NONE L (12-1 PM):  Yogurt Dannon [place] w/ small amt of granola and blueberries (13g) Snk (PM): 4 oz Protein shake (25g)  D (PM): 3 oz of lean protein, green beans or broccoli (20-25g) Snk (PM): 4 oz Protein shake (25g)  Fluid intake:  64-90 oz Estimated total protein intake: 110-115 g  Medications: No changes reported. Off all DM meds Supplementation: Taking regularly  CBG monitoring: daily Average 2hrPP per patient:  126-142 mg Average FBG per patient:  135 mg (down from pre-op of 165 mg) Last patient reported A1c: 7.4% (06/09/12) - will have new A1c soon  Using straws: No Drinking while eating: No, but only waits 10 min after eating.  Hair loss: No Carbonated beverages: No N/V/D/C: Constipation worse lately; using MOM prn. Has switched to 1 cap Miralax BID Dumping syndrome: No  Recent physical activity:  Only ADLs at this point    Progress Towards Goal(s):  In progress.   Nutritional Diagnosis:  [place]-3.3 Overweight/obesity related to past poor dietary habits and physical inactivity as evidenced by patient w/ recent RYGB surgery following dietary guidelines for continued weight loss.    Intervention:  Nutrition education.  Monitoring/Evaluation:  Dietary intake, exercise,  and body weight. Follow up in 3 months for 6 month post-op visit.

## 2012-10-27 ENCOUNTER — Encounter: Payer: Self-pay | Admitting: *Deleted

## 2012-10-27 NOTE — Progress Notes (Signed)
Dillon Jones dropped in for repeat Tanita scan today.   Surgery date: 06/09/12  Surgery type: RYGB  Start weight at [facility]: 429.3 lbs (02/18/12)   Weight today: 308.5 lbs Weight change: 16.5 lbs Total weight lost: 113.8 lbs  Goal weight: 200 lbs  % goal met:  42%  TANITA  BODY COMP RESULTS  05/15/12 06/24/12 08/05/12 09/02/12 10/27/12   BMI (kg/m^2) 57.1 53.6 48.6  46.3 42.4   Fat Mass (lbs) -- 247.0 232.5 206.5 163.5   Fat Free Mass (lbs) -- 137.5 116.0 125.5 145.0   Total Body Water (lbs) -- 100.5 85.0 92.0 106.0

## 2012-11-26 ENCOUNTER — Ambulatory Visit (INDEPENDENT_AMBULATORY_CARE_PROVIDER_SITE_OTHER): Payer: BC Managed Care – PPO | Admitting: Surgery

## 2012-12-03 ENCOUNTER — Encounter: Payer: Self-pay | Admitting: *Deleted

## 2012-12-03 ENCOUNTER — Encounter (INDEPENDENT_AMBULATORY_CARE_PROVIDER_SITE_OTHER): Payer: Self-pay | Admitting: Surgery

## 2012-12-03 ENCOUNTER — Encounter: Payer: BC Managed Care – PPO | Attending: Surgery | Admitting: *Deleted

## 2012-12-03 VITALS — Ht 71.5 in | Wt 299.0 lb

## 2012-12-03 DIAGNOSIS — Z6841 Body Mass Index (BMI) 40.0 and over, adult: Secondary | ICD-10-CM

## 2012-12-03 DIAGNOSIS — Z9884 Bariatric surgery status: Secondary | ICD-10-CM | POA: Insufficient documentation

## 2012-12-03 DIAGNOSIS — Z713 Dietary counseling and surveillance: Secondary | ICD-10-CM | POA: Insufficient documentation

## 2012-12-03 DIAGNOSIS — Z09 Encounter for follow-up examination after completed treatment for conditions other than malignant neoplasm: Secondary | ICD-10-CM | POA: Insufficient documentation

## 2012-12-03 NOTE — Patient Instructions (Addendum)
Goals:  Continue to follow Phase 3B: High Protein + Non-Starchy Vegetables  Add 15 grams of carbohydrate (fruit, whole grains) with meals  Avoid drinking 15 minutes before, during and 30 minutes after eating  Aim for >30 min of physical activity daily  Try no snack at bedtime to help regulate fasting blood sugars  Resume calcium supplementation; Set an alarm or reminder of some sort.   Increase fiber intake via whole wheat breads, etc.

## 2012-12-03 NOTE — Progress Notes (Signed)
Follow-up visit:  6 Months Post-Operative RYGB Surgery  Medical Nutrition Therapy:  Appt start time: 1000  End time:  1030.  Primary concerns today: Post-operative Bariatric Surgery Nutrition Management.  Surgery date: 06/09/12  Surgery type: RYGB  Start weight at [facility]: 429.3 lbs (02/18/12)   Weight today: 299.0 lbs Weight change: 9.5 lbs Total weight lost: 130.3 lbs  Goal weight: 200 lbs  % goal met:  %  TANITA  BODY COMP RESULTS  05/15/12 06/24/12 08/05/12 09/02/12 10/27/12 12/03/12   BMI (kg/m^2) 57.1 53.6 48.6  46.3 42.4 41.1   Fat Mass (lbs) -- 247.0 232.5 206.5 163.5 157.5   Fat Free Mass (lbs) -- 137.5 116.0 125.5 145.0 141.5   Total Body Water (lbs) -- 100.5 85.0 92.0 106.0 103.5   24-hr recall: B (9-9:30 AM): (4 oz) Protein shake (25g) Snk (AM):  NONE L (12-1 PM):  Yogurt Dannon L&F [place] w/ small amt of granola and blueberries (13g) Snk (PM):  Piece of cheese D (PM):  1/2 cup of lean protein, green beans, small salad, broccoli  Snk (PM): 4 oz Protein shake (25g)  Fluid intake:  32 oz bottle (x3-4) = 95-125 oz Estimated total protein intake: 110-115 g  Medications: No changes reported. Off all DM meds Supplementation: Taking MVI (Opurity) and B12.  Remembers only 1 dose of calcium daily  CBG monitoring: Daily Average 2hrPP per patient:  Not checked in last month Average FBG per patient:  130 mg (down from pre-op of 165 mg) Last patient reported A1c: 5.8% (@ MD)  Using straws: No Drinking while eating: No, but only waits 10 min after eating.  Hair loss: No Carbonated beverages: No N/V/D/C: Constipation continues. Takes Miralax prn. Asks about the safety of taking probiotics. Redirected to surgeon.  Dumping syndrome: No  Recent physical activity:  ADLs, water jogging ~ 4 times/week (for last 3 weeks)    Progress Towards Goal(s):  In progress.   Nutritional Diagnosis:  [name]-3.3 Overweight/obesity related to past poor dietary habits and physical inactivity as  evidenced by patient w/ recent RYGB surgery following dietary guidelines for continued weight loss.    Intervention:  Nutrition education.  Monitoring/Evaluation:  Dietary intake, exercise, and body weight. Follow up in 6 months for 12 month post-op visit.

## 2013-01-14 ENCOUNTER — Ambulatory Visit (INDEPENDENT_AMBULATORY_CARE_PROVIDER_SITE_OTHER): Payer: BC Managed Care – PPO | Admitting: Surgery

## 2013-01-20 ENCOUNTER — Encounter (INDEPENDENT_AMBULATORY_CARE_PROVIDER_SITE_OTHER): Payer: Self-pay | Admitting: Surgery

## 2013-01-20 ENCOUNTER — Ambulatory Visit (INDEPENDENT_AMBULATORY_CARE_PROVIDER_SITE_OTHER): Payer: BC Managed Care – PPO | Admitting: Surgery

## 2013-01-20 VITALS — BP 124/82 | HR 78 | Temp 97.9°F | Resp 18 | Ht 71.5 in | Wt 292.6 lb

## 2013-01-20 DIAGNOSIS — Z9884 Bariatric surgery status: Secondary | ICD-10-CM

## 2013-01-20 NOTE — Progress Notes (Signed)
Dillon Jones 62 y.o.  Body mass index is 40.24 kg/(m^2).  Patient Active Problem List   Diagnosis Date Noted  . Lap Roux Y Gastric Bypass Dec 2013 06/09/2012  . Morbid obesity with BMI of 55.8 06/05/2012  . Diabetes mellitus-type II 02/06/2012  . OSA on CPAP 02/06/2012  . Hypothyroidism 02/06/2012  . Hypertension 02/06/2012  . Hypercholesteremia 02/06/2012  . Arthritis 02/06/2012  . H/O umbilical hernia repair-mesh used; done in [place] 02/06/2012    No Known Allergies  Past Surgical History  Procedure Laterality Date  . Hernia repair  1992    umbilical  . Breath tek h pylori  04/22/2012    Procedure: BREATH TEK H PYLORI;  Surgeon: Valarie Merino, MD;  Location: [facility];  Service: General;  Laterality: N/A;  . Right knee arthroscopy  1999  . Gastric roux-en-y  06/09/2012    Procedure: LAPAROSCOPIC ROUX-EN-Y GASTRIC;  Surgeon: Valarie Merino, MD;  Location: [facility];  Service: General;  Laterality: N/A;  . Ventral hernia repair  06/09/2012    Procedure: LAPAROSCOPIC VENTRAL HERNIA;  Surgeon: Valarie Merino, MD;  Location: [facility];  Service: General;;  primary repair of ventral hernia   [name], MD No diagnosis found.  Doing very well as DM has all but resolved.  Down to 1 Metformin a day.  Dillon Jones has been injecting his knees.  He is doing workouts in his pool.  Incisions are ok.   He is down 112 lbs since surgery.  BMI is down to 40.    Encouraged to continue weight loss and excercised.  Will see at 1 year anniversary in December.   Dillon B. Daphine Deutscher, MD, [facility] [phone] beeper [phone]  01/20/2013 6:37 PM

## 2013-01-20 NOTE — Patient Instructions (Signed)
Thanks for your patience.  If you need further assistance after leaving the office, please call our office and speak with a [facility] nurse.  [phone].  If you want to leave a message for Dr. Dinah Lupa, please call his office phone at [phone]. 

## 2013-03-26 ENCOUNTER — Encounter (INDEPENDENT_AMBULATORY_CARE_PROVIDER_SITE_OTHER): Payer: Self-pay | Admitting: Surgery

## 2013-04-16 ENCOUNTER — Other Ambulatory Visit: Payer: Self-pay

## 2013-06-09 ENCOUNTER — Ambulatory Visit: Payer: BC Managed Care – PPO | Admitting: *Deleted

## 2013-06-25 ENCOUNTER — Ambulatory Visit (INDEPENDENT_AMBULATORY_CARE_PROVIDER_SITE_OTHER): Payer: BC Managed Care – PPO | Admitting: Surgery

## 2013-07-06 ENCOUNTER — Encounter (INDEPENDENT_AMBULATORY_CARE_PROVIDER_SITE_OTHER): Payer: Self-pay | Admitting: Surgery

## 2013-07-07 ENCOUNTER — Encounter: Payer: BC Managed Care – PPO | Attending: Family Medicine | Admitting: Dietician

## 2013-07-07 VITALS — Ht 71.5 in | Wt 274.5 lb

## 2013-07-07 DIAGNOSIS — Z9884 Bariatric surgery status: Secondary | ICD-10-CM | POA: Insufficient documentation

## 2013-07-07 DIAGNOSIS — E669 Obesity, unspecified: Secondary | ICD-10-CM | POA: Insufficient documentation

## 2013-07-07 DIAGNOSIS — Z6837 Body mass index (BMI) 37.0-37.9, adult: Secondary | ICD-10-CM | POA: Insufficient documentation

## 2013-07-07 DIAGNOSIS — Z713 Dietary counseling and surveillance: Secondary | ICD-10-CM | POA: Insufficient documentation

## 2013-07-07 DIAGNOSIS — Z6841 Body Mass Index (BMI) 40.0 and over, adult: Secondary | ICD-10-CM

## 2013-07-07 NOTE — Progress Notes (Signed)
Follow-up visit:  13 Months Post-Operative RYGB Surgery  Medical Nutrition Therapy:  Appt start time: 845  End time: 915  .  Primary concerns today: Post-operative Bariatric Surgery Nutrition Management. Dillon Jones returns today with a 24.5 lbs weight loss and had a plateau for awhile during the holiday. Since the beginning of January lost about 10 lbs. Starting using protein shakes again which has helped jump start weight loss. Stopped having alcoholic drinks at nighttime. Tolerating everything okay.   Surgery date: 06/09/12  Surgery type: RYGB  Start weight at [facility]: 429.3 lbs (02/18/12)   Weight today: 274.5 lbs lbs  Weight change: 24.5 lbs, 47.5 lbs of fat mass loss Total weight lost: 154.8 lbs  Goal weight: 200 lbs  % goal met:  %  TANITA  BODY COMP RESULTS  05/15/12 06/24/12 08/05/12 09/02/12 10/27/12 12/03/12 07/07/13   BMI (kg/m^2) 57.1 53.6 48.6  46.3 42.4 41.1 37.8   Fat Mass (lbs) -- 247.0 232.5 206.5 163.5 157.5 110.0   Fat Free Mass (lbs) -- 137.5 116.0 125.5 145.0 141.5 164.5   Total Body Water (lbs) -- 100.5 85.0 92.0 106.0 103.5 120.5   24-hr recall: B (9-9:30 AM): 1 egg (sometimes 2) 6-12 g  Snk (AM):  NONE L (12-1 PM):  8 oz Protein shake [place] (21 g) Snk (PM):  Sometimes will have cheese or another shake D (PM):  1/2 cup of lean protein, green beans, small salad, broccoli  Snk (PM): 8 oz Protein shake (25g)  Fluid intake:  At least 96 fl oz water + coffee  + tea + protein shakes Estimated total protein intake: 110-115 g  Medications: reduced losartan by 1/2 Supplementation: Taking MVI (Opurity) and B12.  Remembers only 1 dose of calcium daily  CBG monitoring: not checking Average 2hrPP per patient:  Not checked in last month Average FBG per patient:   Not checked in last month Last patient reported A1c: 5.8% (@ MD)  Using straws: No Drinking while eating: occasionally, working on not doing it  Hair loss: No Carbonated beverages: No N/V/D/C: Constipation resolved  with Miralax every other day and taking acidophilus  Dumping syndrome: No  Recent physical activity:  4  X week times/week for 3-4 miles, 60-80 minutes total  Progress Towards Goal(s):  In progress.   Nutritional Diagnosis:  Dillon Jones-3.3 Overweight/obesity related to past poor dietary habits and physical inactivity as evidenced by patient w/ recent RYGB surgery following dietary guidelines for continued weight loss.    Intervention:  Nutrition education.  Monitoring/Evaluation:  Dietary intake, exercise, and body weight. Follow up prn   

## 2013-07-07 NOTE — Patient Instructions (Addendum)
Goals:  Continue to follow Phase 3B: High Protein + Non-Starchy Vegetables  Add 15 grams of carbohydrate (fruit, whole grains) with meals  Continue working on avoiding drinking 15 minutes before, during and 30 minutes after eating  Continue getting >30 min of physical activity daily

## 2016-04-30 ENCOUNTER — Encounter (HOSPITAL_COMMUNITY): Payer: Self-pay

## 2017-01-10 ENCOUNTER — Encounter (HOSPITAL_COMMUNITY): Payer: Self-pay

## 2018-02-26 ENCOUNTER — Other Ambulatory Visit (HOSPITAL_COMMUNITY): Payer: Self-pay | Admitting: Family Medicine

## 2018-02-26 DIAGNOSIS — R011 Cardiac murmur, unspecified: Secondary | ICD-10-CM

## 2018-03-07 ENCOUNTER — Other Ambulatory Visit: Payer: Self-pay

## 2018-03-07 ENCOUNTER — Ambulatory Visit (HOSPITAL_COMMUNITY): Payer: 59 | Attending: Internal Medicine

## 2018-03-07 DIAGNOSIS — I080 Rheumatic disorders of both mitral and aortic valves: Secondary | ICD-10-CM | POA: Insufficient documentation

## 2018-03-07 DIAGNOSIS — Z6837 Body mass index (BMI) 37.0-37.9, adult: Secondary | ICD-10-CM | POA: Diagnosis not present

## 2018-03-07 DIAGNOSIS — I110 Hypertensive heart disease with heart failure: Secondary | ICD-10-CM | POA: Insufficient documentation

## 2018-03-07 DIAGNOSIS — R011 Cardiac murmur, unspecified: Secondary | ICD-10-CM

## 2018-03-07 DIAGNOSIS — G4733 Obstructive sleep apnea (adult) (pediatric): Secondary | ICD-10-CM | POA: Insufficient documentation

## 2018-03-07 DIAGNOSIS — E785 Hyperlipidemia, unspecified: Secondary | ICD-10-CM | POA: Insufficient documentation

## 2018-03-07 DIAGNOSIS — I509 Heart failure, unspecified: Secondary | ICD-10-CM | POA: Insufficient documentation

## 2018-03-07 DIAGNOSIS — E119 Type 2 diabetes mellitus without complications: Secondary | ICD-10-CM | POA: Insufficient documentation

## 2018-03-07 MED ORDER — PERFLUTREN LIPID MICROSPHERE
1.0000 mL | INTRAVENOUS | Status: AC | PRN
Start: 2018-03-07 — End: 2018-03-07
  Administered 2018-03-07: 2 mL via INTRAVENOUS

## 2018-06-12 DIAGNOSIS — C61 Malignant neoplasm of prostate: Secondary | ICD-10-CM | POA: Diagnosis not present

## 2018-07-16 NOTE — Progress Notes (Signed)
Cardiology Office Note   Date:  07/23/2018   ID:  [name], [name] 05/21/1951, MRN [id number]  PCP:  Maurice Small, MD  Cardiologist:   Jenkins Rouge, MD   No chief complaint on file.     History of Present Illness: Dillon Jones is a 68 y.o. male who presents for consultation regarding aortic stenosis. Referred by Dr Justin Mend Reviewed TTE done 03/07/18  EF 65-70% mild LVH grade one diastolic AV not well seen mean gradient 23 mmHg peak 50 mmHg AVA 1.24 CRF;s include DM, HTN and HLD Not on statin. Also on thyroid replacement   He had bariatric surgery 5 years ago and although he is still heavy he is down 100 lbs. He works at [employer] a lot and does woodworking as a hobby  Long discussion with him about diagnosis, prognosis and f/u of aortic stenosis. He denies syncope, chest pain or dyspnea. Also discussed need for baseline stress testing to r/o CAD in setting of moderate AS  He sees dentist twice / year and teeth are in good shape    Past Medical History:  Diagnosis Date  . Anemia   . Arthritis   . Diabetes mellitus    Has had for 13 years, type 2  . Diabetes mellitus-type II 02/06/2012  . GERD (gastroesophageal reflux disease)    Occasional - only 1x q51m  . H/O umbilical hernia repair-mesh used; done in [place] 02/06/2012  . Hypercholesteremia 02/06/2012  . Hyperlipidemia   . Hypertension   . Hypothyroidism   . Lap Roux Y Gastric Bypass Dec 2013 06/09/2012  . Morbid obesity ([place])   . Morbid obesity with BMI of 55.8 06/05/2012  . OSA on CPAP 02/06/2012  . Sleep apnea     Past Surgical History:  Procedure Laterality Date  . BREATH TEK H PYLORI  04/22/2012   Procedure: BREATH TEK H PYLORI;  Surgeon: Pedro Earls, MD;  Location: [facility];  Service: General;  Laterality: N/A;  . GASTRIC ROUX-EN-Y  06/09/2012   Procedure: LAPAROSCOPIC ROUX-EN-Y GASTRIC;  Surgeon: Pedro Earls, MD;  Location: [facility];  Service: General;  Laterality: N/A;  . HERNIA REPAIR  3244   umbilical  . right knee arthroscopy  1999  . VENTRAL HERNIA REPAIR  06/09/2012   Procedure: LAPAROSCOPIC VENTRAL HERNIA;  Surgeon: Pedro Earls, MD;  Location: [facility];  Service: General;;  primary repair of ventral hernia     Current Outpatient Medications  Medication Sig Dispense Refill  . CALCIUM PO Take 500 mg by mouth 3 (three) times daily.     . Cholecalciferol (VITAMIN D-3 PO) Take by mouth.    . Cyanocobalamin (B-12 PO) Take by mouth.    . hydrochlorothiazide (MICROZIDE) 12.5 MG capsule Take by mouth.    . levothyroxine (SYNTHROID, LEVOTHROID) 200 MCG tablet Take 200 mcg by mouth daily before breakfast. Taking Synthroid 264mcg daily except on Monday and Fridays pt takes Synthroid 290mcg BID    . lisinopril (PRINIVIL,ZESTRIL) 10 MG tablet Take 10 mg by mouth daily.    . metFORMIN (GLUMETZA) 1000 MG (MOD) 24 hr tablet Take 1,000 mg by mouth daily with breakfast.     . Multiple Vitamin (MULTIVITAMIN WITH MINERALS) TABS Take 2 tablets by mouth daily.     Glory Rosebush VERIO test strip     . Probiotic Product (PROBIOTIC & ACIDOPHILUS EX ST PO) Take by mouth.    . traMADol (ULTRAM) 50 MG tablet Take 25 mg by mouth  every 6 (six) hours as needed.     No current facility-administered medications for this visit.     Allergies:   Patient has no known allergies.    Social History:  The patient  reports that he has never smoked. He has never used smokeless tobacco. He reports current alcohol use. He reports that he does not use drugs.   Family History:  The patient's family history includes Cancer in his mother; Diabetes in his brother, father, and maternal grandmother; Heart disease in his father; Hypertension in his brother and father.    ROS:  Please see the history of present illness.   Otherwise, review of systems are positive for none.   All other systems are reviewed and negative.    PHYSICAL EXAM: VS:  BP 112/70   Pulse 72   Ht 5' 11.5" (1.816 m)   Wt (!) 304 lb 12.8 oz  (138.3 kg)   SpO2 98%   BMI 41.92 kg/m  , BMI Body mass index is 41.92 kg/m. Affect appropriate Overweight male  HEENT: normal Neck supple with no adenopathy JVP normal no bruits no thyromegaly Lungs clear with no wheezing and good diaphragmatic motion Heart:  S1/S2 preserved moderate AS  murmur, no rub, gallop or click PMI normal Abdomen: benighn, BS positve, no tenderness, no AAA no bruit.  No HSM or HJR Distal pulses intact with no bruits No edema Neuro non-focal Skin warm and dry No muscular weakness    EKG:  SR rate 72 LVH    Recent Labs: No results found for requested labs within last 8760 hours.    Lipid Panel    Component Value Date/Time   CHOL 163 02/06/2012 1653   TRIG 130 02/06/2012 1653   HDL 49 02/06/2012 1653   CHOLHDL 3.3 02/06/2012 1653   VLDL 26 02/06/2012 1653   LDLCALC 88 02/06/2012 1653      Wt Readings from Last 3 Encounters:  07/23/18 (!) 304 lb 12.8 oz (138.3 kg)  07/07/13 274 lb 8 oz (124.5 kg)  01/20/13 292 lb 9.6 oz (132.7 kg)      Other studies Reviewed: Additional studies/ records that were reviewed today include: Notes from primary ECG and TTE done .03/07/18    ASSESSMENT AND PLAN:  1.  Aortic Stenosis:  Moderate needs f/u TTE March and if stable yearly Discussed diagnosis of valve disease And Rx options in future. Stress myovue to make sure he truly is asymptomatic and r/o cAD 2. HTN:  Well controlled.  Continue current medications and low sodium Dash type diet.   3. DM:  Discussed low carb diet.  Target hemoglobin A1c is 6.5 or less.  Continue current medications. 4. Thyroid:  On synthroid labs with primary    Current medicines are reviewed at length with the patient today.  The patient does not have concerns regarding medicines.  The following changes have been made:  None   Labs/ tests ordered today include: TTE March and myovue now   Orders Placed This Encounter  Procedures  . MYOCARDIAL PERFUSION IMAGING  . EKG  12-Lead  . ECHOCARDIOGRAM COMPLETE     Disposition:   FU with me in 6 months      Signed, Jenkins Rouge, MD  07/23/2018 4:47 PM    [facility] [address], [place], [place]  [address] Phone: [phone]; Fax: [phone]

## 2018-07-23 ENCOUNTER — Encounter: Payer: Self-pay | Admitting: Cardiovascular Disease

## 2018-07-23 ENCOUNTER — Ambulatory Visit: Payer: PPO | Admitting: Cardiovascular Disease

## 2018-07-23 VITALS — BP 112/70 | HR 72 | Ht 71.5 in | Wt 304.8 lb

## 2018-07-23 DIAGNOSIS — I10 Essential (primary) hypertension: Secondary | ICD-10-CM

## 2018-07-23 DIAGNOSIS — I350 Nonrheumatic aortic (valve) stenosis: Secondary | ICD-10-CM

## 2018-07-23 NOTE — Patient Instructions (Addendum)
Medication Instructions:   If you need a refill on your cardiac medications before your next appointment, please call your pharmacy.   Lab work:  If you have labs (blood work) drawn today and your tests are completely normal, you will receive your results only by: Marland Kitchen MyChart Message (if you have MyChart) OR . A paper copy in the mail If you have any lab test that is abnormal or we need to change your treatment, we will call you to review the results.  Testing/Procedures: Your physician has requested that you have en exercise stress myoview. For further information please visit [email or web address]. Please follow instruction sheet, as given.  Your physician has requested that you have an echocardiogram in 6 months. Echocardiography is a painless test that uses sound waves to create images of your heart. It provides your doctor with information about the size and shape of your heart and how well your heart's chambers and valves are working. This procedure takes approximately one hour. There are no restrictions for this procedure.   Follow-Up: At [facility], you and your health needs are our priority.  As part of our continuing mission to provide you with exceptional heart care, we have created designated Provider Care Teams.  These Care Teams include your primary Cardiologist (physician) and Advanced Practice Providers (APPs -  Physician Assistants and Nurse Practitioners) who all work together to provide you with the care you need, when you need it. You will need a follow up appointment in 6 months.  Please call our office 2 months in advance to schedule this appointment.  You may see Dr. Johnsie Cancel or one of the following Advanced Practice Providers on your designated Care Team:   Truitt Merle, NP Cecilie Kicks, NP . Kathyrn Drown, NP

## 2018-08-19 ENCOUNTER — Encounter: Payer: Self-pay | Admitting: *Deleted

## 2018-08-19 ENCOUNTER — Telehealth (HOSPITAL_COMMUNITY): Payer: Self-pay | Admitting: *Deleted

## 2018-08-19 NOTE — Telephone Encounter (Signed)
Left message on voicemail in reference to upcoming appointment scheduled for 08/25/18. Phone number given for a call back so details instructions can be given.  Dillon Jones

## 2018-08-25 ENCOUNTER — Other Ambulatory Visit: Payer: Self-pay

## 2018-08-25 ENCOUNTER — Ambulatory Visit (HOSPITAL_COMMUNITY): Payer: PPO | Attending: Cardiovascular Disease

## 2018-08-25 DIAGNOSIS — I10 Essential (primary) hypertension: Secondary | ICD-10-CM | POA: Insufficient documentation

## 2018-08-25 DIAGNOSIS — I350 Nonrheumatic aortic (valve) stenosis: Secondary | ICD-10-CM | POA: Insufficient documentation

## 2018-08-25 MED ORDER — REGADENOSON 0.4 MG/5ML IV SOLN
0.4000 mg | Freq: Once | INTRAVENOUS | Status: AC
  Administered 2018-08-25: 0.4 mg via INTRAVENOUS

## 2018-08-25 MED ORDER — TECHNETIUM TC 99M TETROFOSMIN IV KIT
32.7000 | PACK | Freq: Once | INTRAVENOUS | Status: AC | PRN
  Administered 2018-08-25: 32.7 via INTRAVENOUS
  Filled 2018-08-25: qty 33

## 2018-08-26 ENCOUNTER — Telehealth: Payer: Self-pay | Admitting: Cardiovascular Disease

## 2018-08-26 ENCOUNTER — Ambulatory Visit (HOSPITAL_COMMUNITY): Payer: PPO | Attending: Cardiovascular Disease

## 2018-08-26 LAB — MYOCARDIAL PERFUSION IMAGING
CHL CUP NUCLEAR SSS: 5
LV sys vol: 49 mL
LVDIAVOL: 102 mL (ref 62–150)
Peak HR: 108 {beats}/min
Rest HR: 81 {beats}/min
SDS: 5
SRS: 0
TID: 1.08

## 2018-08-26 MED ORDER — TECHNETIUM TC 99M TETROFOSMIN IV KIT
32.6000 | PACK | Freq: Once | INTRAVENOUS | Status: AC | PRN
  Administered 2018-08-26: 32.6 via INTRAVENOUS
  Filled 2018-08-26: qty 33

## 2018-08-26 NOTE — Telephone Encounter (Signed)
Patient returned called for test results.

## 2018-08-26 NOTE — Telephone Encounter (Signed)
The patient has been notified of the result and verbalized understanding.  All questions (if any) were answered. Pt thanked me for the call and the good news on his myoview. Julaine Hua, [facility] 08/26/2018 3:42 PM

## 2018-09-08 ENCOUNTER — Telehealth: Payer: Self-pay | Admitting: Radiation Oncology

## 2018-09-08 NOTE — Telephone Encounter (Signed)
New Message:     Lft msg with patient to call back to set up either Webex/Telephone Encounter for appt on tomorrow

## 2018-09-11 ENCOUNTER — Encounter: Payer: Self-pay | Admitting: Radiation Oncology

## 2018-09-11 NOTE — Progress Notes (Signed)
GU Location of Tumor / Histology: prostatic adenocarcinoma  If Prostate Cancer, Gleason Score is (3 + 4) and PSA is (5.54). Prostate volume: 39.5 mL.   Dillon Jones was referred by Dr. Maurice Small to Dr. Jeffie Pollock in September 2019 for further evaluation of an elevated PSA.   Biopsies of prostate (if applicable) revealed:    Past/Anticipated interventions by urology, if any: prostate biopsy, referral for consideration of brachytherapy  Past/Anticipated interventions by medical oncology, if any: no  Weight changes, if any: no  Bowel/Bladder complaints, if any: IPSS 4. SHIM 3 related to buried penis and associated scar tissue. Denies dysuria or hematuria. Reports on a few occasions he had dribble scant urine before voiding.    Nausea/Vomiting, if any: no  Pain issues, if any:  Intermittent joint aches and pain related to history of obesity.   SAFETY ISSUES:  Prior radiation? no  Pacemaker/ICD? no  Possible current pregnancy? no, male patient  Is the patient on methotrexate? no  Current Complaints / other details:  68 year old male. Married. Retired. NKDA. Obese. Hx of gastric bypass.

## 2018-09-12 ENCOUNTER — Ambulatory Visit
Admission: RE | Admit: 2018-09-12 | Discharge: 2018-09-12 | Disposition: A | Discharge status: Home / Self Care | Payer: PPO | Source: Ambulatory Visit | Attending: Radiation Oncology | Admitting: Radiation Oncology

## 2018-09-12 ENCOUNTER — Encounter: Payer: Self-pay | Admitting: Radiation Oncology

## 2018-09-12 ENCOUNTER — Other Ambulatory Visit: Payer: Self-pay

## 2018-09-12 VITALS — Ht 71.0 in | Wt 300.0 lb

## 2018-09-12 DIAGNOSIS — C61 Malignant neoplasm of prostate: Secondary | ICD-10-CM | POA: Insufficient documentation

## 2018-09-12 DIAGNOSIS — R972 Elevated prostate specific antigen [PSA]: Secondary | ICD-10-CM | POA: Diagnosis not present

## 2018-09-12 HISTORY — DX: Malignant neoplasm of prostate: C61

## 2018-09-12 NOTE — Progress Notes (Signed)
Radiation Oncology         [phone]) [phone] ________________________________  Initial Outpatient Consultation - Conducted via WebEx due to current COVID-19 concerns for limiting patient exposure  Name: KEYMARI SATO MRN: [id number]  Date: 09/12/2018  DOB: 01-14-1951  GB:TDVV, Arbie Cookey, MD  Irine Seal, MD   REFERRING PHYSICIAN: Irine Seal, MD  DIAGNOSIS: 68 y.o. gentleman with Stage T2a adenocarcinoma of the prostate with Gleason score of 3+4, and PSA of 5.54.    ICD-10-CM   1. Prostate cancer ([place]) C61     HISTORY OF PRESENT ILLNESS: LIJAH BOURQUE is a 68 y.o. male with a diagnosis of prostate cancer. He was noted to have an elevated PSA of 5.77 by his primary care physician, Dr. Maurice Small.  Accordingly, he was referred for evaluation in urology by Dr. Jeffie Pollock on 02/24/2018,  digital rectal examination was performed at that time revealing a 7 mm nodule in the left apex and a 4 mm nodule in the right base.  The patient proceeded to transrectal ultrasound with 12 biopsies of the prostate on 05/12/2018.  The prostate volume measured 39.5 cc.  Out of 12 core biopsies, 3 were positive.  The maximum Gleason score was 3+4, and this was seen in left apex lateral. Additionally, gleason 3+3 was seen in left apex and right apex.  The patient reviewed the biopsy results with his urologist and he has kindly been referred today for discussion of potential radiation treatment options.  PREVIOUS RADIATION THERAPY: No  PAST MEDICAL HISTORY:  Past Medical History:  Diagnosis Date  . Anemia   . Arthritis   . Diabetes mellitus    Has had for 13 years, type 2  . Diabetes mellitus-type II 02/06/2012  . GERD (gastroesophageal reflux disease)    Occasional - only 1x q42m  . H/O umbilical hernia repair-mesh used; done in [place] 02/06/2012  . Hypercholesteremia 02/06/2012  . Hyperlipidemia   . Hypertension   . Hypothyroidism   . Lap Roux Y Gastric Bypass Dec 2013 06/09/2012  . Morbid obesity ([place])   . Morbid  obesity with BMI of 55.8 06/05/2012  . OSA on CPAP 02/06/2012  . Prostate cancer ([place])   . Sleep apnea       PAST SURGICAL HISTORY: Past Surgical History:  Procedure Laterality Date  . BREATH TEK H PYLORI  04/22/2012   Procedure: BREATH TEK H PYLORI;  Surgeon: Pedro Earls, MD;  Location: [facility];  Service: General;  Laterality: N/A;  . GASTRIC ROUX-EN-Y  06/09/2012   Procedure: LAPAROSCOPIC ROUX-EN-Y GASTRIC;  Surgeon: Pedro Earls, MD;  Location: [facility];  Service: General;  Laterality: N/A;  . HERNIA REPAIR  6160   umbilical  . PROSTATE BIOPSY    . right knee arthroscopy  1999  . VENTRAL HERNIA REPAIR  06/09/2012   Procedure: LAPAROSCOPIC VENTRAL HERNIA;  Surgeon: Pedro Earls, MD;  Location: [facility];  Service: General;;  primary repair of ventral hernia    FAMILY HISTORY:  Family History  Problem Relation Age of Onset  . Breast cancer Mother   . Heart disease Father   . Diabetes Father   . Hypertension Father   . Diabetes Brother        2 brothers  . Hypertension Brother   . Diabetes Maternal Grandmother   . Prostate cancer Neg Hx   . Colon cancer Neg Hx   . Pancreatic cancer Neg Hx     SOCIAL HISTORY:  Social History   Socioeconomic  History  . Marital status: Married    Spouse name: Not on file  . Number of children: 2  . Years of education: Not on file  . Highest education level: Not on file  Occupational History    Comment: working full time  Social Needs  . Financial resource strain: Not on file  . Food insecurity:    Worry: Not on file    Inability: Not on file  . Transportation needs:    Medical: Not on file    Non-medical: Not on file  Tobacco Use  . Smoking status: Never Smoker  . Smokeless tobacco: Never Used  Substance and Sexual Activity  . Alcohol use: Yes    Comment: 3oz - 4 times per week  . Drug use: No  . Sexual activity: Not Currently  Lifestyle  . Physical activity:    Days per week: Not on file    Minutes per  session: Not on file  . Stress: Not on file  Relationships  . Social connections:    Talks on phone: Not on file    Gets together: Not on file    Attends religious service: Not on file    Active member of club or organization: Not on file    Attends meetings of clubs or organizations: Not on file    Relationship status: Not on file  . Intimate partner violence:    Fear of current or ex partner: Not on file    Emotionally abused: Not on file    Physically abused: Not on file    Forced sexual activity: Not on file  Other Topics Concern  . Not on file  Social History Narrative   Had 3 children but 1 passed away    ALLERGIES: Patient has no known allergies.  MEDICATIONS:  Current Outpatient Medications  Medication Sig Dispense Refill  . CALCIUM PO Take 500 mg by mouth 3 (three) times daily.     . Cyanocobalamin (B-12 PO) Take by mouth.    . hydrochlorothiazide (MICROZIDE) 12.5 MG capsule Take by mouth.    . levothyroxine (SYNTHROID, LEVOTHROID) 200 MCG tablet Take 200 mcg by mouth daily before breakfast. Taking Synthroid 274mcg daily except on Monday and Fridays pt takes Synthroid 223mcg BID    . lisinopril (PRINIVIL,ZESTRIL) 10 MG tablet Take 10 mg by mouth daily.    . metFORMIN (GLUCOPHAGE) 1000 MG tablet TAKE 1 TABLET BY MOUTH daily WITH A MEAL    . Multiple Vitamin (MULTIVITAMIN WITH MINERALS) TABS Take 2 tablets by mouth daily.     Glory Rosebush VERIO test strip     . traMADol (ULTRAM) 50 MG tablet Take 25 mg by mouth every 6 (six) hours as needed. Does not take daily only as needed and no more than 1 tablet.    . Cholecalciferol (VITAMIN D-3 PO) Take by mouth.    . Probiotic Product (PROBIOTIC & ACIDOPHILUS EX ST PO) Take by mouth.     No current facility-administered medications for this encounter.     REVIEW OF SYSTEMS:  On review of systems, the patient reports that he is doing well overall. He denies any chest pain, shortness of breath, cough, fevers, chills, night sweats,  unintended weight changes. He denies any bowel disturbances, and denies abdominal pain, nausea or vomiting. He denies any new musculoskeletal or joint aches or pains. His IPSS was 4, indicating mild urinary symptoms. He reports occasional scant urine dribble before voiding. His SHIM was 3, indicating he has severe erectile  dysfunction. He notes this is related to buried penis and associated scar tissue. A complete review of systems is obtained and is otherwise negative.  PHYSICAL EXAM:  Wt Readings from Last 3 Encounters:  09/12/18 300 lb (136.1 kg)  08/25/18 (!) 304 lb (137.9 kg)  07/23/18 (!) 304 lb 12.8 oz (138.3 kg)   Temp Readings from Last 3 Encounters:  01/20/13 97.9 F (36.6 C) (Temporal)  08/29/12 97.6 F (36.4 C) (Oral)  06/30/12 97.2 F (36.2 C)   BP Readings from Last 3 Encounters:  07/23/18 112/70  01/20/13 124/82  08/29/12 132/84   Pulse Readings from Last 3 Encounters:  07/23/18 72  01/20/13 78  08/29/12 76   Pain Assessment Pain Score: 1 (joint pain related to years of obesity)/10  In general this is a well appearing caucasian male in no acute distress. He's alert and oriented x4 and appropriate throughout the examination. Cardiopulmonary assessment is negative for acute distress and he exhibits normal effort.    KPS = 90  100 - Normal; no complaints; no evidence of disease. 90   - Able to carry on normal activity; minor signs or symptoms of disease. 80   - Normal activity with effort; some signs or symptoms of disease. 60   - Cares for self; unable to carry on normal activity or to do active work. 60   - Requires occasional assistance, but is able to care for most of his personal needs. 50   - Requires considerable assistance and frequent medical care. 21   - Disabled; requires special care and assistance. 61   - Severely disabled; hospital admission is indicated although death not imminent. 38   - Very sick; hospital admission necessary; active supportive  treatment necessary. 10   - Moribund; fatal processes progressing rapidly. 0     - Dead  Karnofsky DA, Abelmann [place], Craver LS and [facility] [phone]) The use of the nitrogen mustards in the palliative treatment of carcinoma: with particular reference to bronchogenic carcinoma Cancer 1 634-56  LABORATORY DATA:  Lab Results  Component Value Date   WBC 10.0 06/11/2012   HGB 11.7 (L) 06/11/2012   HCT 36.5 (L) 06/11/2012   MCV 81.1 06/11/2012   PLT 188 06/11/2012   Lab Results  Component Value Date   NA 136 06/09/2012   K 3.8 06/09/2012   CL 99 06/09/2012   CO2 23 06/09/2012   Lab Results  Component Value Date   ALT 20 06/09/2012   AST 20 06/09/2012   ALKPHOS 59 06/09/2012   BILITOT 0.3 06/09/2012     RADIOGRAPHY: No results found.    IMPRESSION/PLAN: This visit was conducted via WebEx to spare the patient unnecessary potential exposure in the healthcare setting during the current COVID-19 pandemic.   1. 68 y.o. gentleman with Stage T2a adenocarcinoma of the prostate with Gleason Score of 3+4, and PSA of 5.54. We discussed the patient's workup and outlined the nature of prostate cancer in this setting. The patient's T stage, Gleason's score, and PSA put him into the favorable intermediate risk group. Accordingly, he is eligible for a variety of potential treatment options including brachytherapy, 5.5 weeks of external radiation or prostatectomy. We discussed the available radiation techniques, and focused on the details and logistics and delivery. We discussed and outlined the risks, benefits, short and long-term effects associated with radiotherapy and compared and contrasted these with prostatectomy. We discussed the role of SpaceOAR in reducing the rectal toxicity associated with radiotherapy.   At  the end of the conversation the patient is interested in moving forward with brachytherapy and use of SpaceOAR to reduce rectal toxicity from radiotherapy.  We will share this  information with Dr. Jeffie Pollock and proceed with treatment planning accordingly.  Our coordinator, Romie Jumper, will contact the patient in the near future and will be working closely with him to coordinate OR scheduling and pre and post procedure appointments. We did discuss that the timing of scheduling his procedure will depend on progress made in controlling the current COVID-19 pandemic which may delay the scheduling of pre-procedure testing and or start of treatment as we move forward.  He understands that we will remain in close communication with him to coordinate this procedure.  The patient appears to have a good understanding of his disease and our recommendations and is comfortable and in agreement with the stated plan.   We will contact the pharmaceutical rep to ensure that [place] is available at the time of procedure.  He will have a prostate MRI following his post-seed CT SIM to confirm appropriate distribution of the [place].  Given current concerns for patient exposure during the COVID-19 pandemic, this encounter was conducted via telemedicine WebEx. The patient was notified in advance and was offered a [place] meeting to allow for face to face communication to which he agreed. The patient has given verbal consent for this type of encounter. The time spent during this encounter was 45 minutes with 50% of that time spent in review of outside records and coordination or the patient's care. The attendants for this meeting include Tyler Pita MD, Ashlyn Bruning PA-C, [place], and patient Maxfield L. Leonhardt. During the encounter, Tyler Pita MD, Ashlyn Bruning PA-C, and scribe, Wilburn Mylar were located at [place].  Patient Sedric L. Toohey was located at home.   Nicholos Johns, PA-C    Tyler Pita, MD  [facility] Direct Dial: [phone]  Fax: [phone] [email or web address]  Skype   LinkedIn   This document serves as a record of services personally performed by Tyler Pita, MD and Freeman Caldron, PA-C. It was created on their behalf by Wilburn Mylar, a trained medical scribe. The creation of this record is based on the scribe's personal observations and the provider's statements to them. This document has been checked and approved by the attending provider.

## 2018-09-12 NOTE — Progress Notes (Signed)
See progress noted under physician encounter.  

## 2018-09-15 ENCOUNTER — Telehealth: Payer: Self-pay | Admitting: *Deleted

## 2018-09-15 NOTE — Telephone Encounter (Signed)
Called patient to ask questions, spoke with patient 

## 2018-09-16 ENCOUNTER — Telehealth: Payer: Self-pay | Admitting: Medical Oncology

## 2018-09-16 NOTE — Telephone Encounter (Signed)
Called Mr. Dillon Jones and introduced myself as the prostate nurse navigator and my role. I was unable to meet him due to his consult was conducted by WebEx  4/3.He states the visit went very well and has decided on brachytherapy as treatment for his prostate cancer. Enid Derry has been in contact with him and is working to get scheduled. All questions were answered and I asked him to call me if he should have further questions or concerns. He voiced understanding.

## 2018-09-19 ENCOUNTER — Telehealth: Payer: Self-pay | Admitting: *Deleted

## 2018-09-19 NOTE — Telephone Encounter (Signed)
CALLED PATIENT TO INFORM OF PRE-SEED PLANNING CT FOR 11-06-18 - ARRIVAL TIME - 12:45 PM, SPOKE WITH PATIENT AND HE IS AWARE OF THIS APPT.

## 2018-10-03 ENCOUNTER — Other Ambulatory Visit: Payer: Self-pay | Admitting: Urology

## 2018-10-30 ENCOUNTER — Other Ambulatory Visit: Payer: Self-pay | Admitting: Urology

## 2018-11-06 ENCOUNTER — Ambulatory Visit: Payer: Self-pay | Admitting: Urology

## 2018-11-06 ENCOUNTER — Ambulatory Visit
Admission: RE | Admit: 2018-11-06 | Discharge: 2018-11-06 | Disposition: A | Discharge status: Home / Self Care | Payer: PPO | Source: Ambulatory Visit | Attending: Radiation Oncology | Admitting: Radiation Oncology

## 2018-11-06 DIAGNOSIS — C61 Malignant neoplasm of prostate: Secondary | ICD-10-CM

## 2018-11-07 ENCOUNTER — Ambulatory Visit: Payer: PPO | Admitting: Urology

## 2018-11-13 ENCOUNTER — Telehealth: Payer: Self-pay | Admitting: *Deleted

## 2018-11-13 NOTE — Telephone Encounter (Signed)
CALLED PATIENT TO REMIND OF PRE-SEED APPT. FOR 11-14-18, LVM FOR A RETURN CALL

## 2018-11-14 ENCOUNTER — Ambulatory Visit
Admission: RE | Admit: 2018-11-14 | Discharge: 2018-11-14 | Disposition: A | Discharge status: Home / Self Care | Payer: PPO | Source: Ambulatory Visit | Attending: Urology | Admitting: Urology

## 2018-11-14 ENCOUNTER — Other Ambulatory Visit: Payer: Self-pay

## 2018-11-14 DIAGNOSIS — C61 Malignant neoplasm of prostate: Secondary | ICD-10-CM | POA: Diagnosis not present

## 2018-11-17 NOTE — Progress Notes (Signed)
  Radiation Oncology         [phone] ________________________________  Name: Dillon Jones MRN: [id number]  Date: 11/14/2018  DOB: [birth year]  SIMULATION AND TREATMENT PLANNING NOTE PUBIC ARCH STUDY  KG:URKY, Arbie Cookey, MD  Maurice Small, MD  DIAGNOSIS: 68 y.o. gentleman with Stage T2a adenocarcinoma of the prostate with Gleason score of 3+4, and PSA of 5.54.     ICD-10-CM   1. Prostate cancer ([place]) C61     COMPLEX SIMULATION:  The patient presented today for evaluation for possible prostate seed implant. He was brought to the radiation planning suite and placed supine on the CT couch. A 3-dimensional image study set was obtained in upload to the planning computer. There, on each axial slice, I contoured the prostate gland. Then, using three-dimensional radiation planning tools I reconstructed the prostate in view of the structures from the transperineal needle pathway to assess for possible pubic arch interference. In doing so, I did not appreciate any pubic arch interference. Also, the patient's prostate volume was estimated based on the drawn structure. The volume was 39 cc.  Given the pubic arch appearance and prostate volume, patient remains a good candidate to proceed with prostate seed implant. Today, he freely provided informed written consent to proceed.    PLAN: The patient will undergo prostate seed implant.   ________________________________  Sheral Apley. Tammi Klippel, M.D.

## 2018-11-21 ENCOUNTER — Other Ambulatory Visit: Payer: Self-pay | Admitting: Urology

## 2018-11-21 DIAGNOSIS — C61 Malignant neoplasm of prostate: Secondary | ICD-10-CM

## 2018-11-25 DIAGNOSIS — C61 Malignant neoplasm of prostate: Secondary | ICD-10-CM | POA: Diagnosis not present

## 2018-12-02 ENCOUNTER — Telehealth: Payer: Self-pay | Admitting: *Deleted

## 2018-12-02 NOTE — Progress Notes (Signed)
07/23/2018- noted in Epic-EKG

## 2018-12-02 NOTE — Telephone Encounter (Signed)
CALLED PATIENT TO REMIND PF PRE-OP APPT. FOR 12-03-18 @ 1 PM, LVM FOR A RETURN CALL

## 2018-12-02 NOTE — Patient Instructions (Addendum)
Dillon Jones  12/02/2018   Your procedure is scheduled on: Tuesday 12/09/2018  Report to [facility] Main  Entrance              Report to Short Stay  at  [place] [facility] TEST ON_______ @_______ , THIS TEST MUST BE DONE BEFORE SURGERY, COME TO [place].             ONCE    YOUR COVID TEST IS COMPLETED, PLEASE BEGIN THE QUARANTINE INSTRUCTIONS AS OUTLINED IN YOUR HANDOUT.   Call this number if you have problems the morning of surgery [phone]               Please use one Fleet's Enema the morning of surgery! How to Manage Your Diabetes Before and After Surgery  Why is it important to control my blood sugar before and after surgery? . Improving blood sugar levels before and after surgery helps healing and can limit problems. . A way of improving blood sugar control is eating a healthy diet by: o  Eating less sugar and carbohydrates o  Increasing activity/exercise o  Talking with your doctor about reaching your blood sugar goals . High blood sugars (greater than 180 mg/dL) can raise your risk of infections and slow your recovery, so you will need to focus on controlling your diabetes during the weeks before surgery. . Make sure that the doctor who takes care of your diabetes knows about your planned surgery including the date and location.  How do I manage my blood sugar before surgery? . Check your blood sugar at least 4 times a day, starting 2 days before surgery, to make sure that the level is not too high or low. o Check your blood sugar the morning of your surgery when you wake up and every 2 hours until you get to the Short Stay unit. . If your blood sugar is less than 70 mg/dL, you will need to treat for low blood sugar: o Do not take insulin. o Treat a low blood sugar (less than 70 mg/dL) with  cup of clear juice (cranberry or apple), 4 glucose tablets, OR glucose  gel. o Recheck blood sugar in 15 minutes after treatment (to make sure it is greater than 70 mg/dL). If your blood sugar is not greater than 70 mg/dL on recheck, call [phone] for further instructions. . Report your blood sugar to the short stay nurse when you get to Short Stay.  . If you are admitted to the hospital after surgery: o Your blood sugar will be checked by the staff and you will probably be given insulin after surgery (instead of oral diabetes medicines) to make sure you have good blood sugar levels. o The goal for blood sugar control after surgery is 80-180 mg/dL.   WHAT DO I DO ABOUT MY DIABETES MEDICATION?         The day before surgery, Take Metformin (Glucophage) as usual.  . Do not take oral diabetes medicines (pills) the morning of surgery.     Remember: Do not eat food or drink liquids :After Midnight.               BRUSH YOUR TEETH MORNING OF SURGERY AND RINSE YOUR MOUTH OUT, NO CHEWING GUM CANDY  OR MINTS.     Take these medicines the morning of surgery with A SIP OF WATER: Levothyroxine (Synthroid)               DO NOT TAKE ANY DIABETIC MEDICATIONS DAY OF YOUR SURGERY!                               You may not have any metal on your body including hair pins and              piercings  Do not wear jewelry, make-up, lotions, powders or perfumes, deodorant                       Men may shave face and neck.   Do not bring valuables to the hospital. [place].  Contacts, dentures or bridgework may not be worn into surgery.  Leave suitcase in the car. After surgery it may be brought to your room.     Patients discharged the day of surgery will not be allowed to drive home. IF YOU ARE HAVING SURGERY AND GOING HOME THE SAME DAY, YOU MUST HAVE AN ADULT TO DRIVE YOU HOME AND BE WITH YOU FOR 24 HOURS. YOU MAY GO HOME BY TAXI OR UBER OR ORTHERWISE, BUT AN ADULT MUST ACCOMPANY YOU HOME AND STAY WITH YOU FOR 24  HOURS.  Name and phone number of your driver:               Please read over the following fact sheets you were given: _____________________________________________________________________             [facility] - Preparing for Surgery Before surgery, you can play an important role.  Because skin is not sterile, your skin needs to be as free of germs as possible.  You can reduce the number of germs on your skin by washing with CHG (chlorahexidine gluconate) soap before surgery.  CHG is an antiseptic cleaner which kills germs and bonds with the skin to continue killing germs even after washing. Please DO NOT use if you have an allergy to CHG or antibacterial soaps.  If your skin becomes reddened/irritated stop using the CHG and inform your nurse when you arrive at Short Stay. Do not shave (including legs and underarms) for at least 48 hours prior to the first CHG shower.  You may shave your face/neck. Please follow these instructions carefully:  1.  Shower with CHG Soap the night before surgery and the  morning of Surgery.  2.  If you choose to wash your hair, wash your hair first as usual with your  normal  shampoo.  3.  After you shampoo, rinse your hair and body thoroughly to remove the  shampoo.                           4.  Use CHG as you would any other liquid soap.  You can apply chg directly  to the skin and wash                       Gently with a scrungie or clean washcloth.  5.  Apply the CHG Soap to your body ONLY FROM THE NECK DOWN.   Do not use on  face/ open                           Wound or open sores. Avoid contact with eyes, ears mouth and genitals (private parts).                       Wash face,  Genitals (private parts) with your normal soap.             6.  Wash thoroughly, paying special attention to the area where your surgery  will be performed.  7.  Thoroughly rinse your body with warm water from the neck down.  8.  DO NOT shower/wash with your normal soap after using  and rinsing off  the CHG Soap.                9.  Pat yourself dry with a clean towel.            10.  Wear clean pajamas.            11.  Place clean sheets on your bed the night of your first shower and do not  sleep with pets. Day of Surgery : Do not apply any lotions/deodorants the morning of surgery.  Please wear clean clothes to the hospital/surgery center.  FAILURE TO FOLLOW THESE INSTRUCTIONS MAY RESULT IN THE CANCELLATION OF YOUR SURGERY PATIENT SIGNATURE_________________________________  NURSE SIGNATURE__________________________________  ________________________________________________________________________

## 2018-12-03 ENCOUNTER — Encounter (HOSPITAL_COMMUNITY): Payer: Self-pay

## 2018-12-03 ENCOUNTER — Encounter (HOSPITAL_COMMUNITY)
Admission: RE | Admit: 2018-12-03 | Discharge: 2018-12-03 | Disposition: A | Discharge status: Home / Self Care | Payer: PPO | Source: Ambulatory Visit | Attending: Urology | Admitting: Urology

## 2018-12-03 ENCOUNTER — Other Ambulatory Visit: Payer: Self-pay

## 2018-12-03 ENCOUNTER — Ambulatory Visit (HOSPITAL_COMMUNITY)
Admission: RE | Admit: 2018-12-03 | Discharge: 2018-12-03 | Disposition: A | Discharge status: Home / Self Care | Payer: PPO | Source: Ambulatory Visit | Attending: Urology | Admitting: Urology

## 2018-12-03 DIAGNOSIS — C61 Malignant neoplasm of prostate: Secondary | ICD-10-CM

## 2018-12-03 DIAGNOSIS — E119 Type 2 diabetes mellitus without complications: Secondary | ICD-10-CM | POA: Diagnosis not present

## 2018-12-03 DIAGNOSIS — Z9884 Bariatric surgery status: Secondary | ICD-10-CM | POA: Insufficient documentation

## 2018-12-03 DIAGNOSIS — I350 Nonrheumatic aortic (valve) stenosis: Secondary | ICD-10-CM | POA: Insufficient documentation

## 2018-12-03 DIAGNOSIS — G4733 Obstructive sleep apnea (adult) (pediatric): Secondary | ICD-10-CM | POA: Insufficient documentation

## 2018-12-03 DIAGNOSIS — Z79890 Hormone replacement therapy (postmenopausal): Secondary | ICD-10-CM | POA: Insufficient documentation

## 2018-12-03 DIAGNOSIS — E785 Hyperlipidemia, unspecified: Secondary | ICD-10-CM | POA: Insufficient documentation

## 2018-12-03 DIAGNOSIS — Z7984 Long term (current) use of oral hypoglycemic drugs: Secondary | ICD-10-CM | POA: Diagnosis not present

## 2018-12-03 DIAGNOSIS — I10 Essential (primary) hypertension: Secondary | ICD-10-CM | POA: Diagnosis not present

## 2018-12-03 DIAGNOSIS — E039 Hypothyroidism, unspecified: Secondary | ICD-10-CM | POA: Insufficient documentation

## 2018-12-03 DIAGNOSIS — Z79899 Other long term (current) drug therapy: Secondary | ICD-10-CM | POA: Diagnosis not present

## 2018-12-03 DIAGNOSIS — Z01818 Encounter for other preprocedural examination: Secondary | ICD-10-CM | POA: Diagnosis not present

## 2018-12-03 DIAGNOSIS — K219 Gastro-esophageal reflux disease without esophagitis: Secondary | ICD-10-CM | POA: Diagnosis not present

## 2018-12-03 LAB — CBC
HCT: 39.6 % (ref 39.0–52.0)
Hemoglobin: 11.4 g/dL — ABNORMAL LOW (ref 13.0–17.0)
MCH: 22.8 pg — ABNORMAL LOW (ref 26.0–34.0)
MCHC: 28.8 g/dL — ABNORMAL LOW (ref 30.0–36.0)
MCV: 79.4 fL — ABNORMAL LOW (ref 80.0–100.0)
Platelets: 242 K/uL (ref 150–400)
RBC: 4.99 MIL/uL (ref 4.22–5.81)
RDW: 19.2 % — ABNORMAL HIGH (ref 11.5–15.5)
WBC: 7.3 K/uL (ref 4.0–10.5)
nRBC: 0 % (ref 0.0–0.2)

## 2018-12-03 LAB — COMPREHENSIVE METABOLIC PANEL
ALT: 20 U/L (ref 0–44)
AST: 21 U/L (ref 15–41)
Albumin: 3.8 g/dL (ref 3.5–5.0)
Alkaline Phosphatase: 52 U/L (ref 38–126)
Anion gap: 12 (ref 5–15)
BUN: 26 mg/dL — ABNORMAL HIGH (ref 8–23)
CO2: 21 mmol/L — ABNORMAL LOW (ref 22–32)
Calcium: 8.9 mg/dL (ref 8.9–10.3)
Chloride: 103 mmol/L (ref 98–111)
Creatinine, Ser: 1.23 mg/dL (ref 0.61–1.24)
GFR calc Af Amer: >60 mL/min (ref >60)
GFR calc non Af Amer: >60 mL/min (ref >60)
Glucose, Bld: 108 mg/dL — ABNORMAL HIGH (ref 70–99)
Potassium: 4.4 mmol/L (ref 3.5–5.1)
Sodium: 136 mmol/L (ref 135–145)
Total Bilirubin: 0.3 mg/dL (ref 0.3–1.2)
Total Protein: 7.7 g/dL (ref 6.5–8.1)

## 2018-12-03 LAB — PROTIME-INR
INR: 0.9 (ref 0.8–1.2)
Prothrombin Time: 12.4 seconds (ref 11.4–15.2)

## 2018-12-03 LAB — GLUCOSE, CAPILLARY: Glucose-Capillary: 115 mg/dL — ABNORMAL HIGH (ref 70–99)

## 2018-12-03 LAB — APTT: aPTT: 25 s (ref 24–36)

## 2018-12-03 MED ORDER — FLEET ENEMA 7-19 GM/118ML RE ENEM
1.0000 | ENEMA | Freq: Once | RECTAL | Status: DC
  Filled 2018-12-03: qty 1

## 2018-12-05 ENCOUNTER — Other Ambulatory Visit (HOSPITAL_COMMUNITY)
Admission: RE | Admit: 2018-12-05 | Discharge: 2018-12-05 | Disposition: A | Discharge status: Home / Self Care | Payer: PPO | Source: Ambulatory Visit | Attending: Urology | Admitting: Urology

## 2018-12-05 DIAGNOSIS — Z1159 Encounter for screening for other viral diseases: Secondary | ICD-10-CM | POA: Insufficient documentation

## 2018-12-05 LAB — SARS CORONAVIRUS 2 (TAT 6-24 HRS): SARS Coronavirus 2: NEGATIVE

## 2018-12-08 ENCOUNTER — Telehealth: Payer: Self-pay | Admitting: Cardiovascular Disease

## 2018-12-08 ENCOUNTER — Telehealth: Payer: Self-pay | Admitting: *Deleted

## 2018-12-08 NOTE — Progress Notes (Signed)
Anesthesia Chart Review   Case: [id number] Date/Time: 12/09/18 0715   Procedures:      RADIOACTIVE SEED IMPLANT/BRACHYTHERAPY IMPLANT (N/A )     SPACE OAR INSTILLATION (N/A )   Anesthesia type: General   Pre-op diagnosis: PROSTATE CANCER   Location: [place] / [facility]   Surgeon: Irine Seal, MD      DISCUSSION: 68 yo never smoker with h/o HLD, sleep apnea w/CPAP, DM II, s/p gastric bypass 06/09/12, HTN, GERD, moderate AS (Echo 03/07/18 mean gradient of 23 mmHg - AVA around 1.3 cm2, normal LA size), hypothyroidism, prostate cancer scheduled for above procedure 12/09/2018 with Dr. Irine Seal.   Pt last seen by cardiologist, Dr. Jenkins Rouge, 07/23/2018 for consultation regarding aortic stenosis.  Per OV note Echo scheduled for 08/2018 and myoview ordered.  Myoview 08/26/18, low risk study, EF 52%.   Per Fabian Sharp, PA-C with cardiology, "It appears the patient is scheduled for prostate seed placement tomorrow. In review of his chart, he had a low risk myoview 08/26/18. He was scheduled for an echocardiogram to better evaluate aortic valve following murmur on exam. I called and spoke with the patient and he denies SOB, DOE, and orthopnea. He denies anginal symptoms and changes in his cardiac history. Given the above, he may proceed with surgery prior to his echocardiogram scheduled in August 2002."  Anticipate pt can proceed with planned procedure barring acute status change.   VS: BP 130/83   Pulse 85   Temp 36.6 C (Oral)   Resp 16   Ht 5\' 11"  (1.803 m)   Wt (!) 139.4 kg   SpO2 100%   BMI 42.86 kg/m   PROVIDERS: Maurice Small, MD is PCP   Jenkins Rouge, MD is Cardiologist  LABS: Labs reviewed: Acceptable for surgery. (all labs ordered are listed, but only abnormal results are displayed)  Labs Reviewed  CBC - Abnormal; Notable for the following components:      Result Value   Hemoglobin 11.4 (*)    MCV 79.4 (*)    MCH 22.8 (*)    MCHC 28.8 (*)    RDW 19.2 (*)    All other  components within normal limits  COMPREHENSIVE METABOLIC PANEL - Abnormal; Notable for the following components:   CO2 21 (*)    Glucose, Bld 108 (*)    BUN 26 (*)    All other components within normal limits  GLUCOSE, CAPILLARY - Abnormal; Notable for the following components:   Glucose-Capillary 115 (*)    All other components within normal limits  APTT  PROTIME-INR     IMAGES:   EKG: 12/03/2018 Rate 79 bpm Normal sinus rhythm  Minimal voltage cirteria for LVH, may be normal variant  Borderline ECG  No significant change since last tracing   CV: Myocardial Perfusion 08/26/2018  Nuclear stress EF: 52%. Visually, the EF appears to be normal .  This is a low risk study. There is no evidence of ischemia or infarction  The study is normal.    Echo 03/07/18 Study Conclusions  - Procedure narrative: Transthoracic echocardiography. Image   quality was suboptimal. The study was technically difficult.   Intravenous contrast (Definity) was administered to opacify the   LV. - Left ventricle: The cavity size was normal. Wall thickness was   increased in a pattern of mild LVH. Systolic function was   vigorous. The estimated ejection fraction was in the range of 65%   to 70%. Wall motion was normal; there were  no regional wall   motion abnormalities. Doppler parameters are consistent with   abnormal left ventricular relaxation (grade 1 diastolic   dysfunction). The E/e&' ratio is between 8-15, suggesting   indeterminate LV filling pressure. - Aortic valve: Poorly visualized. Moderate stenosis. There was no   significant regurgitation. Mean gradient (S): 23 mm Hg. Peak   gradient (S): 50 mm Hg. Valve area (VTI): 1.56 cm^2. Valve area   (Vmax): 1.24 cm^2. Valve area (Vmean): 1.2 cm^2. - Mitral valve: Calcified annulus. Mildly thickened leaflets .   There was trivial regurgitation. - Left atrium: The atrium was normal in size.  Impressions:  - Technically difficult study.  Definity contrast given. LVEF   65-70%, mild LVH, normal wall motion, grade 1 DD, indeterminate   LV filling pressure, moderate aortic stenosis - mean gradient of   23 mmHg - AVA around 1.3 cm2, normal LA size. Past Medical History:  Diagnosis Date  . Anemia   . Arthritis   . Diabetes mellitus    Has had for 13 years, type 2  . Diabetes mellitus-type II 02/06/2012  . GERD (gastroesophageal reflux disease)    Occasional - only 1x q25m  . H/O umbilical hernia repair-mesh used; done in [place] 02/06/2012  . Hypercholesteremia 02/06/2012  . Hyperlipidemia   . Hypertension   . Hypothyroidism   . Lap Roux Y Gastric Bypass Dec 2013 06/09/2012  . Morbid obesity ([place])   . Morbid obesity with BMI of 55.8 06/05/2012  . OSA on CPAP 02/06/2012  . Prostate cancer ([place])   . Sleep apnea     Past Surgical History:  Procedure Laterality Date  . BREATH TEK H PYLORI  04/22/2012   Procedure: BREATH TEK H PYLORI;  Surgeon: Pedro Earls, MD;  Location: [facility];  Service: General;  Laterality: N/A;  . GASTRIC ROUX-EN-Y  06/09/2012   Procedure: LAPAROSCOPIC ROUX-EN-Y GASTRIC;  Surgeon: Pedro Earls, MD;  Location: [facility];  Service: General;  Laterality: N/A;  . HERNIA REPAIR  5701   umbilical  . PROSTATE BIOPSY    . right knee arthroscopy  1999  . VENTRAL HERNIA REPAIR  06/09/2012   Procedure: LAPAROSCOPIC VENTRAL HERNIA;  Surgeon: Pedro Earls, MD;  Location: [facility];  Service: General;;  primary repair of ventral hernia    MEDICATIONS: . CALCIUM PO  . Cyanocobalamin (B-12 PO)  . hydrochlorothiazide (MICROZIDE) 12.5 MG capsule  . levothyroxine (SYNTHROID, LEVOTHROID) 200 MCG tablet  . lisinopril (PRINIVIL,ZESTRIL) 10 MG tablet  . metFORMIN (GLUCOPHAGE) 1000 MG tablet  . Multiple Vitamin (MULTIVITAMIN WITH MINERALS) TABS  . ONETOUCH VERIO test strip  . traMADol (ULTRAM) 50 MG tablet   No current facility-administered medications for this encounter.     Maia Plan [facility]  Pre-Surgical Testing [phone] 12/08/18  12:32 PM

## 2018-12-08 NOTE — Telephone Encounter (Signed)
CALLED PATIENT TO REMIND OF PROCEDURE FOR 12-09-18, LVM FOR A RETURN CALL

## 2018-12-08 NOTE — Telephone Encounter (Signed)
Message received from [place] with [facility] anesthesia regarding clearance prior to a scheduled echocardiogram.  It appears the patient is scheduled for prostate seed placement tomorrow. In review of his chart, he had a low risk myoview 08/26/18. He was scheduled for an echocardiogram to better evaluate aortic valve following murmur on exam. I called and spoke with the patient and he denies SOB, DOE, and orthopnea. He denies anginal symptoms and changes in his cardiac history.   Given the above, he may proceed with surgery prior to his echocardiogram scheduled in August 2002.   Preop callback - please route to [facility] and Janett Billow with [facility] anesthesia. I do not have a formal preop request for this procedure with fax numbers.   Ledora Bottcher, PA-C 12/08/2018, 11:56 AM [place] [address] [place], [place] [address]

## 2018-12-08 NOTE — H&P (Signed)
CC/HPI: Pt presents today for pre-operative history and physical exam in anticipation of brachytherapy and space oar placement by Dr. Jeffie Pollock on 12/09/18. He is doing well and is without complaint. Pt denies F/C, HA, CP, SOB, N/V, diarrhea/constipation, back pain, flank pain, hematuria, and dysuria.    HX:     CC/HPI: I have prostate cancer.     Dillon Jones returns today to discuss his recent prostate biopsy done for a PSA of 5.54 and a 15mm left apical nodule. He was found to have a 39.42ml prostate with a T2a Nx Mx Gleason 7(3+4) favorable intermediate risk prostate cancer. He had Gleason 7(3+4) in 90% of the left apical lateral core and Gleason 6 in 90% of the left apical medial core. The had Gleason 6 in 10% of the right apical medial core. His IPSS is 3 and his SHIM is 3. He has a history of BXO with a buried penis. The [facility] nomogram predicts a 48% probability of OCD, 51% ECE, 4% LNI and 3% SVI. The predict nomogram predicts and 8% probability of prostate cancer mortality at 65yrs. He is morbidly obese and had a gastric bypass in September 16, 2014. He had a DVT then.     ALLERGIES: No Allergies    MEDICATIONS: Hydrochlorothiazide  Lisinopril  Metformin Hcl 1,000 mg tablet Oral  Synthroid 200 mcg tablet Oral  Tramadol Hcl     GU PSH: Cysto Dilate Stricture (M or F) 09/15/2012 Insert Bladder Cath; Complex - 2012-09-15 Prostate Needle Biopsy - 05/12/2018      PSH Notes: Gastric Surgery, Knee Surgery, Complicated Urethral Catheterization, Cystoscopy For Urethral Stricture, umbilical hernia   NON-GU PSH: Surgical Pathology, Gross And Microscopic Examination For Prostate Needle - 05/12/2018    GU PMH: Prostate Cancer, T2a Nx Mx favorable intermediate risk prostate cancer with a 39.31ml prostate, minimal LUTS and severe ED. I discussed his prostate cancer prognosis and his treatment options. He is leaning toward a seed implant which I think is very appropriate considering his obesity and comorbidities, but he would like  to review the materials and will let me know whether he wants to be seen by radiation oncology. - 06/12/2018 Elevated PSA - 05/12/2018, He has an elevated PSA with 2 small prostate nodules. I am going to get him set up for a prostate [place] and biopsy and reviewed the risks of bleeding, infection and voiding difficulty. Levaquin sent for the prep. I will get a testosterone for baseline purposes. , - 02/24/2018 Prostate nodule w/o LUTS - 02/24/2018 Buried penis (acquired), Acquired buried penis - 2014-09-16 Phimosis, Phimosis - 16-Sep-2014    NON-GU PMH: Cardiac murmur, unspecified, He has a moderate systolic ejection murmur. I will notify Dr. Justin Mend for consideration of further evaluation. - 02/24/2018 DVT, History, History of deep venous thrombosis - 09-16-14 Encounter for general adult medical examination without abnormal findings, Encounter for preventive health examination - 09/16/2014 Personal history of other diseases of the circulatory system, History of hypertension - 09-16-14 Personal history of other diseases of the digestive system, History of esophageal reflux - September 16, 2014 Personal history of other diseases of the musculoskeletal system and connective tissue, History of arthritis - 2014/09/16 Personal history of other diseases of the nervous system and sense organs, History of sleep apnea - 2014/09/16 Personal history of other endocrine, nutritional and metabolic disease, History of hypothyroidism - 09-16-14, History of hypercholesterolemia, - 09/16/2014, History of diabetes mellitus, - 09/16/14    FAMILY HISTORY: Death of parent - Runs In Family No Significant Family History -  Runs In Family   SOCIAL HISTORY: Marital Status: Married Preferred Language: English; Race: White Current Smoking Status: Patient has never smoked.   Tobacco Use Assessment Completed: Used Tobacco in last 30 days? Does not use smokeless tobacco. Does drink.  Does not use drugs. Drinks 2 caffeinated drinks per day. Has not had a blood transfusion.     Notes: Married,  Never a smoker, Alcohol use, Caffeine use, Retired,  vodka 2-3 drinks 5 nights/week   REVIEW OF SYSTEMS:    GU Review Male:   Patient denies frequent urination, hard to postpone urination, burning/ pain with urination, get up at night to urinate, leakage of urine, stream starts and stops, trouble starting your stream, have to strain to urinate , erection problems, and penile pain.  Gastrointestinal (Upper):   Patient denies nausea, vomiting, and indigestion/ heartburn.  Gastrointestinal (Lower):   Patient denies diarrhea and constipation.  Constitutional:   Patient denies fever, night sweats, weight loss, and fatigue.  Skin:   Patient denies skin rash/ lesion and itching.  Eyes:   Patient denies blurred vision and double vision.  Ears/ Nose/ Throat:   Patient denies sore throat and sinus problems.  Hematologic/Lymphatic:   Patient denies swollen glands and easy bruising.  Cardiovascular:   Patient denies leg swelling and chest pains.  Respiratory:   nagging cough for last 6 months . Patient reports cough. Patient denies shortness of breath.  Endocrine:   Patient denies excessive thirst.  Musculoskeletal:   Patient reports joint pain. Patient denies back pain.  Neurological:   Patient denies headaches and dizziness.  Psychologic:   Patient denies depression and anxiety.   VITAL SIGNS:      11/25/2018 02:13 PM  Weight 300 lb / 136.08 kg  Height 71 in / 180.34 cm  BP 148/83 mmHg  Pulse 80 /min  Temperature 98.2 F / 36.7 C  BMI 41.8 kg/m   MULTI-SYSTEM PHYSICAL EXAMINATION:    Constitutional: Well-nourished. No physical deformities. Normally developed. Good grooming.  Neck: Neck symmetrical, not swollen. Normal tracheal position.  Respiratory: Normal breath sounds. No labored breathing, no use of accessory muscles.   Cardiovascular: II/VI systolic murmur; Normal temperature  Lymphatic: No enlargement of neck, axillae, groin.  Skin: No paleness, no jaundice, no cyanosis. No lesion, no  ulcer, no rash.  Neurologic / Psychiatric: Oriented to time, oriented to place, oriented to person. No depression, no anxiety, no agitation.  Gastrointestinal: No mass, no tenderness, no rigidity, obese abdomen.   Eyes: Normal conjunctivae. Normal eyelids.  Ears, Nose, Mouth, and Throat: Left ear no scars, no lesions, no masses. Right ear no scars, no lesions, no masses. Nose no scars, no lesions, no masses. Normal hearing. Normal lips.  Musculoskeletal: Normal gait and station of head and neck.     PAST DATA REVIEWED:  Source Of History:  Patient  Records Review:   Previous Patient Records  Urine Test Review:   Urinalysis   11/25/18  Urinalysis  Urine Appearance Clear   Urine Color Yellow   Urine Glucose Neg mg/dL  Urine Bilirubin Neg mg/dL  Urine Ketones Neg mg/dL  Urine Specific Gravity 1.025   Urine Blood Neg ery/uL  Urine pH 5.5   Urine Protein Trace mg/dL  Urine Urobilinogen 0.2 mg/dL  Urine Nitrites Neg   Urine Leukocyte Esterase 1+ leu/uL  Urine WBC/hpf 0 - 5/hpf   Urine RBC/hpf 0 - 2/hpf   Urine Epithelial Cells 0 - 5/hpf   Urine Bacteria Rare (0-9/hpf)   Urine  Mucous Not Present   Urine Yeast NS (Not Seen)   Urine Trichomonas Not Present   Urine Cystals NS (Not Seen)   Urine Casts NS (Not Seen)   Urine Sperm Not Present    PROCEDURES:          Urinalysis w/Scope - 81001 Dipstick Dipstick Cont'd Micro  Color: Yellow Bilirubin: Neg mg/dL WBC/hpf: 0 - 5/hpf  Appearance: Clear Ketones: Neg mg/dL RBC/hpf: 0 - 2/hpf  Specific Gravity: 1.025 Blood: Neg ery/uL Bacteria: Rare (0-9/hpf)  pH: 5.5 Protein: Trace mg/dL Cystals: NS (Not Seen)  Glucose: Neg mg/dL Urobilinogen: 0.2 mg/dL Casts: NS (Not Seen)    Nitrites: Neg Trichomonas: Not Present    Leukocyte Esterase: 1+ leu/uL Mucous: Not Present      Epithelial Cells: 0 - 5/hpf      Yeast: NS (Not Seen)      Sperm: Not Present    ASSESSMENT:      ICD-10 Details  1 GU:   Prostate Cancer - C61    PLAN:            Schedule Return Visit/Planned Activity: Keep Scheduled Appointment - Schedule Surgery          Document Letter(s):  Created for Patient: Clinical Summary         Notes:   There are no changes in the patients history or physical exam since last evaluation by Dr. Jeffie Pollock. Pt is scheduled to undergo brachytherapy and space oar placement on 12/09/18.   He has mild aortic stenosis that was evaled with echo by Dr. Johnsie Cancel. Pt states he was told this only needs to be monitored for now.   All pt's questions were answered to the best of my ability.          Next Appointment:      Next Appointment: 12/09/2018 07:30 AM    Appointment Type: Surgery     Location: [facility] [phone]    Provider: Irine Seal, M.D.    Reason for Visit: OP [facility] [place]

## 2018-12-08 NOTE — Telephone Encounter (Signed)
Faxed to [facility] and [facility] [phone] with ATTN: Janett Billow.

## 2018-12-08 NOTE — Telephone Encounter (Signed)
New Message   Janett Billow NP with anesthesia a [facility] is calling on behalf of patient. She states that he is scheduled for a procedure tomorrow and per the note in March he was to have a echocardiogram that he has not have. She would like to know is he okay to proceed without the echo. Please call to discuss.

## 2018-12-08 NOTE — Anesthesia Preprocedure Evaluation (Addendum)
Anesthesia Evaluation  Patient identified by MRN, date of birth, ID band Patient awake    Reviewed: Allergy & Precautions, H&P , NPO status , Patient's Chart, lab work & pertinent test results, reviewed documented beta blocker date and time   Airway Mallampati: II  TM Distance: >3 FB Neck ROM: full    Dental no notable dental hx.    Pulmonary sleep apnea and Continuous Positive Airway Pressure Ventilation ,    Pulmonary exam normal breath sounds clear to auscultation       Cardiovascular Exercise Tolerance: Good hypertension, Pt. on medications negative cardio ROS   Rhythm:regular Rate:Normal   EKG: 12/03/2018 Rate 79 bpm Normal sinus rhythm  Minimal voltage cirteria for LVH, may be normal variant  Borderline ECG  No significant change since last tracing   CV: Myocardial Perfusion 08/26/2018  Nuclear stress EF: 52%. Visually, the EF appears to be normal .  This is a low risk study. There is no evidence of ischemia or infarction  The study is normal.   Echo 03/07/18 . LVEF 65-70%, mild LVH, normal wall motion, grade 1 DD, indeterminate LV filling pressure, moderate aortic stenosis - mean gradient of 23 mmHg - AVA around 1.3 cm2, normal LA size.   Neuro/Psych negative neurological ROS  negative psych ROS   GI/Hepatic Neg liver ROS, GERD  Medicated,  Endo/Other  diabetes, Type 2Hypothyroidism Morbid obesity  Renal/GU negative Renal ROS  negative genitourinary   Musculoskeletal  (+) Arthritis , Osteoarthritis,    Abdominal   Peds  Hematology negative hematology ROS (+)   Anesthesia Other Findings   Reproductive/Obstetrics negative OB ROS                           Anesthesia Physical Anesthesia Plan  ASA: III  Anesthesia Plan: General   Post-op Pain Management:    Induction: Intravenous  PONV Risk Score and Plan:   Airway Management Planned: LMA  Additional  Equipment:   Intra-op Plan:   Post-operative Plan: Extubation in OR  Informed Consent: I have reviewed the patients History and Physical, chart, labs and discussed the procedure including the risks, benefits and alternatives for the proposed anesthesia with the patient or authorized representative who has indicated his/her understanding and acceptance.     Dental Advisory Given  Plan Discussed with: CRNA  Anesthesia Plan Comments: (See PAT note 12/03/2018, Konrad Felix, PA-C)      Anesthesia Quick Evaluation

## 2018-12-09 ENCOUNTER — Ambulatory Visit (HOSPITAL_COMMUNITY): Payer: PPO | Admitting: Physician Assistant

## 2018-12-09 ENCOUNTER — Encounter (HOSPITAL_COMMUNITY)
Admission: RE | Disposition: A | Discharge status: Home / Self Care | Payer: Self-pay | Source: Home / Self Care | Attending: Urology

## 2018-12-09 ENCOUNTER — Ambulatory Visit (HOSPITAL_COMMUNITY): Payer: PPO | Admitting: Anesthesiology

## 2018-12-09 ENCOUNTER — Encounter (HOSPITAL_COMMUNITY): Payer: Self-pay

## 2018-12-09 ENCOUNTER — Ambulatory Visit (HOSPITAL_COMMUNITY)
Admission: RE | Admit: 2018-12-09 | Discharge: 2018-12-09 | Disposition: A | Discharge status: Home / Self Care | Payer: PPO | Attending: Urology | Admitting: Urology

## 2018-12-09 ENCOUNTER — Ambulatory Visit (HOSPITAL_COMMUNITY): Payer: PPO

## 2018-12-09 DIAGNOSIS — Z7984 Long term (current) use of oral hypoglycemic drugs: Secondary | ICD-10-CM | POA: Insufficient documentation

## 2018-12-09 DIAGNOSIS — Z6841 Body Mass Index (BMI) 40.0 and over, adult: Secondary | ICD-10-CM | POA: Diagnosis not present

## 2018-12-09 DIAGNOSIS — Z86718 Personal history of other venous thrombosis and embolism: Secondary | ICD-10-CM | POA: Diagnosis not present

## 2018-12-09 DIAGNOSIS — K219 Gastro-esophageal reflux disease without esophagitis: Secondary | ICD-10-CM | POA: Diagnosis not present

## 2018-12-09 DIAGNOSIS — E119 Type 2 diabetes mellitus without complications: Secondary | ICD-10-CM | POA: Diagnosis not present

## 2018-12-09 DIAGNOSIS — C61 Malignant neoplasm of prostate: Secondary | ICD-10-CM | POA: Insufficient documentation

## 2018-12-09 DIAGNOSIS — G4733 Obstructive sleep apnea (adult) (pediatric): Secondary | ICD-10-CM | POA: Diagnosis not present

## 2018-12-09 DIAGNOSIS — G4730 Sleep apnea, unspecified: Secondary | ICD-10-CM | POA: Diagnosis not present

## 2018-12-09 DIAGNOSIS — I350 Nonrheumatic aortic (valve) stenosis: Secondary | ICD-10-CM | POA: Diagnosis not present

## 2018-12-09 DIAGNOSIS — Z79899 Other long term (current) drug therapy: Secondary | ICD-10-CM | POA: Diagnosis not present

## 2018-12-09 DIAGNOSIS — M1990 Unspecified osteoarthritis, unspecified site: Secondary | ICD-10-CM | POA: Insufficient documentation

## 2018-12-09 DIAGNOSIS — I10 Essential (primary) hypertension: Secondary | ICD-10-CM | POA: Insufficient documentation

## 2018-12-09 DIAGNOSIS — E7800 Pure hypercholesterolemia, unspecified: Secondary | ICD-10-CM | POA: Insufficient documentation

## 2018-12-09 DIAGNOSIS — Z9989 Dependence on other enabling machines and devices: Secondary | ICD-10-CM | POA: Diagnosis not present

## 2018-12-09 DIAGNOSIS — E039 Hypothyroidism, unspecified: Secondary | ICD-10-CM | POA: Diagnosis not present

## 2018-12-09 HISTORY — PX: RADIOACTIVE SEED IMPLANT: SHX5150

## 2018-12-09 HISTORY — PX: SPACE OAR INSTILLATION: SHX6769

## 2018-12-09 LAB — GLUCOSE, CAPILLARY
Glucose-Capillary: 90 mg/dL (ref 70–99)
Glucose-Capillary: 118 mg/dL — ABNORMAL HIGH (ref 70–99)

## 2018-12-09 SURGERY — INSERTION, RADIATION SOURCE, PROSTATE
Anesthesia: General | Site: Perineum

## 2018-12-09 MED ORDER — SODIUM CHLORIDE (PF) 0.9 % IJ SOLN
INTRAMUSCULAR | Status: AC
  Filled 2018-12-09: qty 50

## 2018-12-09 MED ORDER — ROPIVACAINE HCL 7.5 MG/ML IJ SOLN: INTRAMUSCULAR | Status: DC | PRN

## 2018-12-09 MED ORDER — ONDANSETRON HCL 4 MG/2ML IJ SOLN: 4.0000 mg | Freq: Once | INTRAMUSCULAR | Status: DC | PRN

## 2018-12-09 MED ORDER — PROPOFOL 10 MG/ML IV BOLUS
INTRAVENOUS | Status: AC
  Filled 2018-12-09: qty 20

## 2018-12-09 MED ORDER — CLONIDINE HCL (ANALGESIA) 100 MCG/ML EP SOLN: EPIDURAL | Status: DC | PRN

## 2018-12-09 MED ORDER — SODIUM CHLORIDE 0.9% FLUSH: 3.0000 mL | Freq: Two times a day (BID) | INTRAVENOUS | Status: DC

## 2018-12-09 MED ORDER — EPHEDRINE 5 MG/ML INJ
INTRAVENOUS | Status: AC
  Filled 2018-12-09: qty 10

## 2018-12-09 MED ORDER — OXYCODONE HCL 5 MG PO TABS: 5.0000 mg | ORAL_TABLET | Freq: Once | ORAL | Status: DC | PRN

## 2018-12-09 MED ORDER — SODIUM CHLORIDE 0.9 % IV SOLN
INTRAVENOUS | Status: DC | PRN
  Administered 2018-12-09: 50 ug/min via INTRAVENOUS

## 2018-12-09 MED ORDER — MIDAZOLAM HCL 2 MG/2ML IJ SOLN
INTRAMUSCULAR | Status: AC
  Filled 2018-12-09: qty 2

## 2018-12-09 MED ORDER — STERILE WATER FOR IRRIGATION IR SOLN
Status: DC | PRN
  Administered 2018-12-09: 1000 mL

## 2018-12-09 MED ORDER — PROPOFOL 10 MG/ML IV BOLUS
INTRAVENOUS | Status: DC | PRN
  Administered 2018-12-09: 200 mg via INTRAVENOUS

## 2018-12-09 MED ORDER — MEPERIDINE HCL 50 MG/ML IJ SOLN: 6.2500 mg | INTRAMUSCULAR | Status: DC | PRN

## 2018-12-09 MED ORDER — ONDANSETRON HCL 4 MG/2ML IJ SOLN
INTRAMUSCULAR | Status: DC | PRN
  Administered 2018-12-09: 4 mg via INTRAVENOUS

## 2018-12-09 MED ORDER — PHENYLEPHRINE HCL (PRESSORS) 10 MG/ML IV SOLN
INTRAVENOUS | Status: DC | PRN
  Administered 2018-12-09: 60 ug via INTRAVENOUS
  Administered 2018-12-09: 80 ug via INTRAVENOUS
  Administered 2018-12-09 (×2): 40 ug via INTRAVENOUS

## 2018-12-09 MED ORDER — LIDOCAINE HCL (CARDIAC) PF 100 MG/5ML IV SOSY
PREFILLED_SYRINGE | INTRAVENOUS | Status: DC | PRN
  Administered 2018-12-09: 100 mg via INTRAVENOUS

## 2018-12-09 MED ORDER — FENTANYL CITRATE (PF) 100 MCG/2ML IJ SOLN
INTRAMUSCULAR | Status: DC | PRN
  Administered 2018-12-09: 50 ug via INTRAVENOUS
  Administered 2018-12-09 (×2): 25 ug via INTRAVENOUS

## 2018-12-09 MED ORDER — FENTANYL CITRATE (PF) 100 MCG/2ML IJ SOLN: 25.0000 ug | INTRAMUSCULAR | Status: DC | PRN

## 2018-12-09 MED ORDER — ONDANSETRON HCL 4 MG/2ML IJ SOLN
INTRAMUSCULAR | Status: AC
  Filled 2018-12-09: qty 2

## 2018-12-09 MED ORDER — OXYCODONE HCL 5 MG/5ML PO SOLN: 5.0000 mg | Freq: Once | ORAL | Status: DC | PRN

## 2018-12-09 MED ORDER — PHENYLEPHRINE 40 MCG/ML (10ML) SYRINGE FOR IV PUSH (FOR BLOOD PRESSURE SUPPORT)
PREFILLED_SYRINGE | INTRAVENOUS | Status: AC
  Filled 2018-12-09: qty 10

## 2018-12-09 MED ORDER — MIDAZOLAM HCL 5 MG/5ML IJ SOLN
INTRAMUSCULAR | Status: DC | PRN
  Administered 2018-12-09: 2 mg via INTRAVENOUS

## 2018-12-09 MED ORDER — ACETAMINOPHEN 160 MG/5ML PO SOLN: 325.0000 mg | ORAL | Status: DC | PRN

## 2018-12-09 MED ORDER — FENTANYL CITRATE (PF) 100 MCG/2ML IJ SOLN
INTRAMUSCULAR | Status: AC
  Filled 2018-12-09: qty 2

## 2018-12-09 MED ORDER — LACTATED RINGERS IV SOLN
INTRAVENOUS | Status: DC
  Administered 2018-12-09: 06:00:00 via INTRAVENOUS

## 2018-12-09 MED ORDER — IOHEXOL 300 MG/ML  SOLN
INTRAMUSCULAR | Status: DC | PRN
  Administered 2018-12-09: 10 mL

## 2018-12-09 MED ORDER — ACETAMINOPHEN 325 MG PO TABS: 325.0000 mg | ORAL_TABLET | ORAL | Status: DC | PRN

## 2018-12-09 MED ORDER — CIPROFLOXACIN IN D5W 400 MG/200ML IV SOLN
400.0000 mg | INTRAVENOUS | Status: AC
  Administered 2018-12-09: 400 mg via INTRAVENOUS
  Filled 2018-12-09: qty 200

## 2018-12-09 MED ORDER — EPHEDRINE SULFATE 50 MG/ML IJ SOLN
INTRAMUSCULAR | Status: DC | PRN
  Administered 2018-12-09: 5 mg via INTRAVENOUS

## 2018-12-09 SURGICAL SUPPLY — 32 items
BAG URINE DRAINAGE (UROLOGICAL SUPPLIES) ×3 IMPLANT
BLADE CLIPPER SURG (BLADE) ×3 IMPLANT
CATH FOLEY 2WAY SLVR  5CC 16FR (CATHETERS) ×2
CATH FOLEY 2WAY SLVR 5CC 16FR (CATHETERS) ×1 IMPLANT
CATH ROBINSON RED A/P 20FR (CATHETERS) ×3 IMPLANT
CLOSURE WOUND 1/2 X4 (GAUZE/BANDAGES/DRESSINGS) ×1
CONT SPEC 4OZ CLIKSEAL STRL BL (MISCELLANEOUS) ×6 IMPLANT
COVER SURGICAL LIGHT HANDLE (MISCELLANEOUS) ×3 IMPLANT
COVER WAND RF STERILE (DRAPES) IMPLANT
DRAPE SURG IRRIG POUCH 19X23 (DRAPES) ×3 IMPLANT
DRSG TEGADERM 4X4.75 (GAUZE/BANDAGES/DRESSINGS) ×6 IMPLANT
DRSG TEGADERM 8X12 (GAUZE/BANDAGES/DRESSINGS) ×6 IMPLANT
GLOVE BIO SURGEON STRL SZ7.5 (GLOVE) ×3 IMPLANT
GLOVE BIOGEL PI IND STRL 8 (GLOVE) ×3 IMPLANT
GLOVE BIOGEL PI INDICATOR 8 (GLOVE) ×6
GLOVE ECLIPSE 8.0 STRL XLNG CF (GLOVE) ×3 IMPLANT
GLOVE SURG SS PI 8.0 STRL IVOR (GLOVE) IMPLANT
GOWN STRL REUS W/TWL LRG LVL3 (GOWN DISPOSABLE) ×3 IMPLANT
GOWN STRL REUS W/TWL XL LVL3 (GOWN DISPOSABLE) ×3 IMPLANT
HOLDER FOLEY CATH W/STRAP (MISCELLANEOUS) ×3 IMPLANT
I-Seed Agx100 ×2 IMPLANT
IMPL SPACEOAR SYSTEM 10ML (Spacer) ×1 IMPLANT
IMPLANT SPACEOAR SYSTEM 10ML (Spacer) ×3 IMPLANT
KIT TURNOVER KIT A (KITS) IMPLANT
MARKER SKIN DUAL TIP RULER LAB (MISCELLANEOUS) ×6 IMPLANT
PACK CYSTO (CUSTOM PROCEDURE TRAY) ×3 IMPLANT
STRIP CLOSURE SKIN 1/2X4 (GAUZE/BANDAGES/DRESSINGS) ×2 IMPLANT
SURGILUBE 2OZ TUBE FLIPTOP (MISCELLANEOUS) ×3 IMPLANT
SYR 10ML LL (SYRINGE) ×3 IMPLANT
TOWEL OR 17X26 10 PK STRL BLUE (TOWEL DISPOSABLE) ×3 IMPLANT
UNDERPAD 30X30 (UNDERPADS AND DIAPERS) ×6 IMPLANT
WATER STERILE IRR 1000ML UROMA (IV SOLUTION) ×3 IMPLANT

## 2018-12-09 NOTE — Anesthesia Postprocedure Evaluation (Signed)
Anesthesia Post Note  Patient: Dillon Jones  Procedure(s) Performed: RADIOACTIVE SEED IMPLANT/BRACHYTHERAPY IMPLANT (N/A Perineum) SPACE OAR INSTILLATION (N/A Perineum)     Patient location during evaluation: PACU Anesthesia Type: General Level of consciousness: awake and alert Pain management: pain level controlled Vital Signs Assessment: post-procedure vital signs reviewed and stable Respiratory status: spontaneous breathing, nonlabored ventilation, respiratory function stable and patient connected to nasal cannula oxygen Cardiovascular status: blood pressure returned to baseline and stable Postop Assessment: no apparent nausea or vomiting Anesthetic complications: no    Last Vitals:  Vitals:   12/09/18 0540 12/09/18 0901  BP: (!) 144/72 (!) 146/72  Pulse: 78 70  Resp: 15 16  Temp: (!) 36.4 C 36.6 C  SpO2: 100% 99%    Last Pain:  Vitals:   12/09/18 0901  TempSrc:   PainSc: 0-No pain                 Kriston Pasquarello

## 2018-12-09 NOTE — Interval H&P Note (Signed)
History and Physical Interval Note:  12/09/2018 7:23 AM  Dillon Jones  has presented today for surgery, with the diagnosis of PROSTATE CANCER.  The various methods of treatment have been discussed with the patient and family. After consideration of risks, benefits and other options for treatment, the patient has consented to  Procedure(s): RADIOACTIVE SEED IMPLANT/BRACHYTHERAPY IMPLANT (N/A) SPACE OAR INSTILLATION (N/A) as a surgical intervention.  The patient's history has been reviewed, patient examined, no change in status, stable for surgery.  I have reviewed the patient's chart and labs.  Questions were answered to the patient's satisfaction.     Irine Seal

## 2018-12-09 NOTE — Anesthesia Procedure Notes (Signed)
Procedure Name: LMA Insertion Date/Time: 12/09/2018 7:40 AM Performed by: Glory Buff, CRNA Pre-anesthesia Checklist: Patient identified, Emergency Drugs available, Suction available and Patient being monitored Patient Re-evaluated:Patient Re-evaluated prior to induction Oxygen Delivery Method: Circle system utilized Preoxygenation: Pre-oxygenation with 100% oxygen Induction Type: IV induction LMA: LMA inserted LMA Size: 4.0 Number of attempts: 1 Placement Confirmation: positive ETCO2 Tube secured with: Tape Dental Injury: Teeth and Oropharynx as per pre-operative assessment

## 2018-12-09 NOTE — Transfer of Care (Signed)
Immediate Anesthesia Transfer of Care Note  Patient: Dillon Jones  Procedure(s) Performed: RADIOACTIVE SEED IMPLANT/BRACHYTHERAPY IMPLANT (N/A Perineum) SPACE OAR INSTILLATION (N/A Perineum)  Patient Location: PACU  Anesthesia Type:General  Level of Consciousness: drowsy, patient cooperative and responds to stimulation  Airway & Oxygen Therapy: Patient Spontanous Breathing and Patient connected to face mask oxygen  Post-op Assessment: Report given to RN and Post -op Vital signs reviewed and stable  Post vital signs: Reviewed and stable  Last Vitals:  Vitals Value Taken Time  BP 146/72 12/09/18 0901  Temp    Pulse 71 12/09/18 0902  Resp 14 12/09/18 0902  SpO2 100 % 12/09/18 0902  Vitals shown include unvalidated device data.  Last Pain:  Vitals:   12/09/18 0608  TempSrc:   PainSc: 0-No pain         Complications: No apparent anesthesia complications

## 2018-12-09 NOTE — Discharge Instructions (Addendum)
Brachytherapy for Prostate Cancer, Care After ° °This sheet gives you information about how to care for yourself after your procedure. Your health care provider may also give you more specific instructions. If you have problems or questions, contact your health care provider. °What can I expect after the procedure? °After the procedure, it is common to have: °· Trouble passing urine. °· Blood in the urine or semen. °· Constipation. °· Frequent feeling of an urgent need to urinate. °· Bruising, swelling, and tenderness of the area behind the scrotum (perineum). °· Bloating and gas. °· Fatigue. °· Burning or pain in the rectum. °· Problems getting or keeping an erection (erectile dysfunction). °· Nausea. °Follow these instructions at home: °Managing pain, stiffness, and swelling °· If directed, apply ice to the affected area: °? Put ice in a plastic bag. °? Place a towel between your skin and the bag. °? Leave the ice on for 20 minutes, 2-3 times a day. °· Try not to sit directly on the area behind the scrotum. A soft cushion can help with discomfort. °Activity °· Do not drive for 24 hours if you were given a medicine to help you relax (sedative). °· Do not drive or use heavy machinery while taking prescription pain medicine. °· Rest as told by your health care provider. °· Most people can return to normal activities a few days or weeks after the procedure. Ask your health care provider what activities are safe for you. °Eating and drinking °· Drink enough fluid to keep your urine clear or pale yellow. °· Eat a healthy, balanced diet. This includes lean proteins, whole grains, and plenty of fruits and vegetables. °General instructions °· Take over-the-counter and prescription medicines only as told by your health care provider. °· Keep all follow-up visits as told by your health care provider. This is important. You may still need additional treatment. °· Do not take baths, swim, or use a hot tub until your health  care provider approves. Shower and wash the area behind the scrotum gently. °· Do not have sex for one week after the treatment, or until your health care provider approves. °· If you have permanent, low-dose brachytherapy implants: °? Limit close contact with children and pregnant women for 2 months or as told by your health care provider. This is important because of the radiation that is still active in the prostate. °? You may set off radioactive sensors, such as airport screenings. Ask your health care provider for a document that explains your treatment. °? You may be instructed to use a condom during sex for the first 2 months after low-dose brachytherapy. °Contact a health care provider if: °· You have a fever or chills. °· You do not have a bowel movement for 3-4 days after the procedure. °· You have diarrhea for 3-4 days after the procedure. °· You develop any new symptoms, such as problems with urinating or erectile dysfunction. °· You have abdomen (abdominal) pain. °· You have more blood in your urine. °Get help right away if: °· You cannot urinate. °· There is excessive bleeding from your rectum. °· You have unusual drainage coming from your rectum. °· You have severe pain in the treated area that does not go away with pain medicine. °· You have severe nausea or vomiting. °Summary °· If you have permanent, low-dose brachytherapy implants, limit close contact with children and pregnant women for 2 months or as told by your health care provider. This is important because of the radiation   that is still active in the prostate.  Talk with your health care provider about your risk of brachytherapy side effects, such as erectile dysfunction or urinary problems. Your health care provider will be able to recommend possible treatment options.  Keep all follow-up visits as told by your health care provider. This is important. You may need additional treatment. This information is not intended to replace  advice given to you by your health care provider. Make sure you discuss any questions you have with your health care provider. Document Released: 06/30/2010 Document Revised: 05/10/2017 Document Reviewed: 06/29/2016 Elsevier Patient Education  2020 [employer].

## 2018-12-09 NOTE — Op Note (Signed)
PATIENT:  Dillon Jones  PRE-OPERATIVE DIAGNOSIS:  Adenocarcinoma of the prostate  POST-OPERATIVE DIAGNOSIS:  Same  PROCEDURE:  Procedure(s): 1. I-125 radioactive seed implantation 2. Cystoscopy  SURGEON:  Surgeon(s): Irine Seal MD  Radiation oncologist: Dr. Tyler Pita  ANESTHESIA:  General  EBL:  Minimal  DRAINS: 19 French Foley catheter  INDICATION: Dillon Jones is a 68 y.o. with Stage T2a Nx Mx , Gleason 7(3+4) prostate cancer who has elected brachytherapy for treatment.  Description of procedure: After informed consent the patient was brought to the major OR, placed on the table and administered general anesthesia. He was then moved to the modified lithotomy position with his perineum perpendicular to the floor. His perineum and genitalia were then sterilely prepped. An official timeout was then performed. A 16 French Foley catheter was then placed in the bladder and filled with dilute contrast, a rectal tube was placed in the rectum and the transrectal ultrasound probe was placed in the rectum and affixed to the stand. He was then sterilely draped.  The sterile grid was installed.   Anchor needles were then placed.   Real time ultrasonography was used along with the seed planning software spot-pro version 3.1-00. This was used to develop the seed plan including the number of needles as well as number of seeds required for complete and adequate coverage. Real-time ultrasonography was then used along with the previously developed plan and the Nucletron device to implant a total of 25 seeds using 65 needles for a target dose of 145 Gy. This proceeded without difficulty or complication.  After completion of the implant the grid was removed and the SpaceOAR needle was placed in the fat stripe posterior to prostate under US guidance and saline was injected to confirm a mid line mid prostate location.  The SpaceOAR polymer was then injected over 8 seconds.   A Foley catheter was  then removed as well as the transrectal ultrasound probe and rectal probe. Flexible cystoscopy was then performed using the 17 French flexible scope which revealed a normal urethra throughout its length down to the sphincter which appeared intact. The prostatic urethra was 2-3cm with bilobar hyperplasia and mild obstruction. The bladder was then entered and fully and systematically inspected.  The ureteral orifices were noted to be of normal configuration and position. The mucosa revealed no evidence of tumors. There were also no stones identified within the bladder.  No seeds or spacers were seen and/or removed from the bladder.  The cystoscope was then removed.  The drapes were removed.  The perineum was cleaned and dressed.  He was taken out of the lithotomy position and was awakened and taken to recovery room in stable and satisfactory condition. He tolerated procedure well and there were no intraoperative complications.

## 2018-12-11 ENCOUNTER — Encounter (HOSPITAL_COMMUNITY): Payer: Self-pay | Admitting: Urology

## 2018-12-11 NOTE — Progress Notes (Signed)
  Radiation Oncology         [phone] ________________________________  Name: Dillon Jones MRN: [id number]  Date: 12/11/2018  DOB: November 18, 1950       Prostate Seed Implant  OZ:DGUY, Arbie Cookey, MD  No ref. provider found  DIAGNOSIS: 68 y.o. gentleman with Stage T2a adenocarcinoma of the prostate with Gleason score of 3+4, and PSA of 5.54.  PROCEDURE: Insertion of radioactive I-125 seeds into the prostate gland.  RADIATION DOSE: 145 Gy, definitive therapy.  TECHNIQUE: SHERREL SHAFER was brought to the operating room with the urologist. He was placed in the dorsolithotomy position. He was catheterized and a rectal tube was inserted. The perineum was shaved, prepped and draped. The ultrasound probe was then introduced into the rectum to see the prostate gland.  TREATMENT DEVICE: A needle grid was attached to the ultrasound probe stand and anchor needles were placed.  3D PLANNING: The prostate was imaged in 3D using a sagittal sweep of the prostate probe. These images were transferred to the planning computer. There, the prostate, urethra and rectum were defined on each axial reconstructed image. Then, the software created an optimized 3D plan and a few seed positions were adjusted. The quality of the plan was reviewed using [facility] information for the target and the following two organs at risk:  Urethra and Rectum.  Then the accepted plan was printed and handed off to the radiation therapist.  Under my supervision, the custom loading of the seeds and spacers was carried out and loaded into sealed vicryl sleeves.  These pre-loaded needles were then placed into the needle holder.Marland Kitchen  PROSTATE VOLUME STUDY:  Using transrectal ultrasound the volume of the prostate was verified to be 35.6 cc.  SPECIAL TREATMENT PROCEDURE/SUPERVISION AND HANDLING: The pre-loaded needles were then delivered under sagittal guidance. A total of 25 needles were used to deposit 65 seeds in the prostate gland. The individual seed  activity was 0.463 mCi.  SpaceOAR:  Yes  COMPLEX SIMULATION: At the end of the procedure, an anterior radiograph of the pelvis was obtained to document seed positioning and count. Cystoscopy was performed to check the urethra and bladder.  MICRODOSIMETRY: At the end of the procedure, the patient was emitting 0.037 mR/hr at 1 meter. Accordingly, he was considered safe for hospital discharge.  PLAN: The patient will return to the radiation oncology clinic for post implant CT dosimetry in three weeks.   ________________________________  Sheral Apley Tammi Klippel, M.D.

## 2018-12-25 ENCOUNTER — Ambulatory Visit (HOSPITAL_COMMUNITY): Payer: PPO

## 2018-12-31 ENCOUNTER — Telehealth: Payer: Self-pay | Admitting: *Deleted

## 2018-12-31 NOTE — Telephone Encounter (Signed)
CALLED PATIENT TO REMIND OF POST OP APPTS. AND MRI FOR 01-01-19, SPOKE WITH PATIENT AND HE IS AWARE OF THESE APPTS.

## 2019-01-01 ENCOUNTER — Ambulatory Visit
Admission: RE | Admit: 2019-01-01 | Discharge: 2019-01-01 | Disposition: A | Discharge status: Home / Self Care | Payer: PPO | Source: Ambulatory Visit | Attending: Urology | Admitting: Urology

## 2019-01-01 ENCOUNTER — Encounter: Payer: Self-pay | Admitting: Radiation Oncology

## 2019-01-01 ENCOUNTER — Other Ambulatory Visit: Payer: Self-pay

## 2019-01-01 ENCOUNTER — Ambulatory Visit
Admission: RE | Admit: 2019-01-01 | Discharge: 2019-01-01 | Disposition: A | Discharge status: Home / Self Care | Payer: PPO | Source: Ambulatory Visit | Attending: Radiation Oncology | Admitting: Radiation Oncology

## 2019-01-01 ENCOUNTER — Encounter: Payer: Self-pay | Admitting: Urology

## 2019-01-01 ENCOUNTER — Ambulatory Visit (HOSPITAL_COMMUNITY)
Admission: RE | Admit: 2019-01-01 | Discharge: 2019-01-01 | Disposition: A | Discharge status: Home / Self Care | Payer: PPO | Source: Ambulatory Visit | Attending: Urology | Admitting: Urology

## 2019-01-01 VITALS — BP 121/69 | HR 74 | Temp 98.6°F | Resp 20 | Ht 71.0 in

## 2019-01-01 DIAGNOSIS — R3911 Hesitancy of micturition: Secondary | ICD-10-CM | POA: Diagnosis not present

## 2019-01-01 DIAGNOSIS — Z7984 Long term (current) use of oral hypoglycemic drugs: Secondary | ICD-10-CM | POA: Insufficient documentation

## 2019-01-01 DIAGNOSIS — R350 Frequency of micturition: Secondary | ICD-10-CM | POA: Insufficient documentation

## 2019-01-01 DIAGNOSIS — Z79899 Other long term (current) drug therapy: Secondary | ICD-10-CM | POA: Insufficient documentation

## 2019-01-01 DIAGNOSIS — C61 Malignant neoplasm of prostate: Secondary | ICD-10-CM

## 2019-01-01 DIAGNOSIS — Z923 Personal history of irradiation: Secondary | ICD-10-CM | POA: Insufficient documentation

## 2019-01-01 NOTE — Progress Notes (Signed)
Received patient in nursing clinic following post seed simulation. Weight and vitals stable. Denies pain. Post seed IPSS 4. Reports increased difficulty starting urine stream. Reports less urge incontinence. Denies dysuria or hematuria. Scheduled for MRI to confirm SpaceOar placement today at 5 pm. Patient does not have a follow up appointment with his urologist.

## 2019-01-01 NOTE — Progress Notes (Signed)
  Radiation Oncology         [phone] ________________________________  Name: Dillon Jones MRN: [id number]  Date: 01/01/2019  DOB: Apr 04, 1951  COMPLEX SIMULATION NOTE  NARRATIVE:  The patient was brought to the [facility] today following prostate seed implantation approximately one month ago.  Identity was confirmed.  All relevant records and images related to the planned course of therapy were reviewed.  Then, the patient was set-up supine.  CT images were obtained.  The CT images were loaded into the planning software.  Then the prostate and rectum were contoured.  Treatment planning then occurred.  The implanted iodine 125 seeds were identified by the physics staff for projection of radiation distribution  I have requested : 3D Simulation  I have requested a DVH of the following structures: Prostate and rectum.    ________________________________  Sheral Apley Tammi Klippel, M.D.

## 2019-01-01 NOTE — Progress Notes (Signed)
Radiation Oncology         [phone] ________________________________  Name: RUFUS BESKE MRN: [id number]  Date: 01/01/2019  DOB: 1951/04/15  Post-Seed Follow-Up Visit Note  CC: Maurice Small, MD  Maurice Small, MD  Diagnosis:   68 y.o. gentleman with Stage T2a adenocarcinoma of the prostate with Gleason score of 3+4, and PSA of 5.54.    ICD-10-CM   1. Prostate cancer ([place])  C61     Interval Since Last Radiation:  3 weeks 12/09/18:  Insertion of radioactive I-125 seeds into the prostate gland; 145 Gy, definitive therapy with placement of SpaceOAR gel.  Narrative:  The patient returns today for routine follow-up.  He is complaining of increased urinary frequency and urinary hesitation symptoms. He filled out a questionnaire regarding urinary function today providing and overall IPSS score of 4 characterizing his symptoms as mild.  He reports persistent hesitancy and mild stinging at the start of his stream but otherwise feels that his LUTS have returned to baseline.  He specifically denies dysuria, gross hematuria, excessive daytime frequency, urgency, nocturia, incomplete bladder emptying or incontinence.  His pre-implant score was 4. He denies any abdominal pain or bowel symptoms.  He reports a healthy appetite and is maintaining his weight.  He denies any significant fatigue.  Overall, he is quite pleased with his progress to date.  ALLERGIES:  has No Known Allergies.  Meds: Current Outpatient Medications  Medication Sig Dispense Refill  . CALCIUM PO Take 500 mg by mouth 3 (three) times daily.     . Cyanocobalamin (B-12 PO) Take 1 tablet by mouth daily.     . hydrochlorothiazide (MICROZIDE) 12.5 MG capsule Take 12.5 mg by mouth daily.     Marland Kitchen levothyroxine (SYNTHROID, LEVOTHROID) 200 MCG tablet Take 200 mcg by mouth See admin instructions. Taking Synthroid 226mcg daily except on Monday and Fridays pt takes Synthroid 231mcg BID    . lisinopril (PRINIVIL,ZESTRIL) 10 MG tablet Take 10 mg  by mouth daily.    . metFORMIN (GLUCOPHAGE) 1000 MG tablet Take 1,000 mg by mouth daily with breakfast.     . Multiple Vitamin (MULTIVITAMIN WITH MINERALS) TABS Take 2 tablets by mouth daily.     Glory Rosebush VERIO test strip     . traMADol (ULTRAM) 50 MG tablet Take 25 mg by mouth every 6 (six) hours as needed (pain). Does not take daily only as needed and no more than 1 tablet.     No current facility-administered medications for this encounter.     Physical Findings: In general this is a well appearing Caucasian male in no acute distress. He's alert and oriented x4 and appropriate throughout the examination. Cardiopulmonary assessment is negative for acute distress and he exhibits normal effort.   Lab Findings: Lab Results  Component Value Date   WBC 7.3 12/03/2018   HGB 11.4 (L) 12/03/2018   HCT 39.6 12/03/2018   MCV 79.4 (L) 12/03/2018   PLT 242 12/03/2018    Radiographic Findings:  Patient underwent CT imaging in our clinic for post implant dosimetry. The CT will be reviewed by Dr. Tammi Klippel to confirm there is an adequate distribution of radioactive seeds throughout the prostate gland and ensure that there are no seeds in or near the rectum. His scheduled for prostate MRI later today at 5pm and those images will be fused with his CT images for further evaluation. We suspect the final radiation plan and dosimetry will show appropriate coverage of the prostate gland. He understands that  we will call and inform him of any unexpected findings on further review of his imaging and dosimetry.  Impression/Plan: 68 y.o. gentleman with Stage T2a adenocarcinoma of the prostate with Gleason score of 3+4, and PSA of 5.54. The patient is recovering from the effects of radiation. His urinary symptoms should gradually improve over the next 4-6 months. We talked about this today. He is encouraged by his improvement already and is otherwise pleased with his outcome. We also talked about long-term  follow-up for prostate cancer following seed implant. He understands that ongoing PSA determinations and digital rectal exams will help perform surveillance to rule out disease recurrence. He does not currently have a follow up appointment scheduled with Dr. Jeffie Pollock but anticipates a visit around September 2020 for repeat PSA. He understands what to expect with his PSA measures. Patient was also educated today about some of the long-term effects from radiation including a small risk for rectal bleeding and possibly erectile dysfunction. We talked about some of the general management approaches to these potential complications. However, I did encourage the patient to contact our office or return at any point if he has questions or concerns related to his previous radiation and prostate cancer.    Nicholos Johns, PA-C

## 2019-01-06 ENCOUNTER — Encounter: Payer: Self-pay | Admitting: Radiation Oncology

## 2019-01-06 DIAGNOSIS — C61 Malignant neoplasm of prostate: Secondary | ICD-10-CM | POA: Diagnosis not present

## 2019-01-09 DIAGNOSIS — C61 Malignant neoplasm of prostate: Secondary | ICD-10-CM | POA: Diagnosis not present

## 2019-01-09 DIAGNOSIS — N401 Enlarged prostate with lower urinary tract symptoms: Secondary | ICD-10-CM | POA: Diagnosis not present

## 2019-01-19 NOTE — Progress Notes (Signed)
CARDIOLOGY OFFICE NOTE  Date:  01/21/2019    Leverne Humbles Date of Birth: 1951-01-26 Medical Record #[id number]  PCP:  Maurice Small, MD  Cardiologist:  Johnsie Cancel    Chief Complaint  Patient presents with  . Follow-up    Seen for Dr. Johnsie Cancel    History of Present Illness: Dillon Jones is a 68 y.o. male who presents today for a a 6 month check. Seen for Dr. Johnsie Cancel.   He has a history of AS, diastolic dysfunction, DM, HTN, HLD and hypothyroidism. He has had prior bariatric surgery.   Seen here in February - felt to be doing well. Myoview arranged to rule out CAD given history.  This turned out ok. He was to have repeat echo prior to next visit.   The patient does not have symptoms concerning for COVID-19 infection (fever, chills, cough, or new shortness of breath).   Comes in today. Here alone. He was to have had his echo prior to our visit - he did not know this - thus not done.   He feels good. No chest pain. Not short of breath. No passing out spells. He is working full time. Seeing PCP at the end of the month for lab and physical.   Past Medical History:  Diagnosis Date  . Anemia   . Arthritis   . Diabetes mellitus    Has had for 13 years, type 2  . Diabetes mellitus-type II 02/06/2012  . GERD (gastroesophageal reflux disease)    Occasional - only 1x q38m  . H/O umbilical hernia repair-mesh used; done in [place] 02/06/2012  . Hypercholesteremia 02/06/2012  . Hyperlipidemia   . Hypertension   . Hypothyroidism   . Lap Roux Y Gastric Bypass Dec 2013 06/09/2012  . Morbid obesity ([place])   . Morbid obesity with BMI of 55.8 06/05/2012  . OSA on CPAP 02/06/2012  . Prostate cancer ([place])   . Sleep apnea     Past Surgical History:  Procedure Laterality Date  . BREATH TEK H PYLORI  04/22/2012   Procedure: BREATH TEK H PYLORI;  Surgeon: Pedro Earls, MD;  Location: [facility];  Service: General;  Laterality: N/A;  . GASTRIC ROUX-EN-Y  06/09/2012   Procedure:  LAPAROSCOPIC ROUX-EN-Y GASTRIC;  Surgeon: Pedro Earls, MD;  Location: [facility];  Service: General;  Laterality: N/A;  . HERNIA REPAIR  4481   umbilical  . PROSTATE BIOPSY    . RADIOACTIVE SEED IMPLANT N/A 12/09/2018   Procedure: RADIOACTIVE SEED IMPLANT/BRACHYTHERAPY IMPLANT;  Surgeon: Irine Seal, MD;  Location: [facility];  Service: Urology;  Laterality: N/A;  . right knee arthroscopy  1999  . SPACE OAR INSTILLATION N/A 12/09/2018   Procedure: SPACE OAR INSTILLATION;  Surgeon: Irine Seal, MD;  Location: [facility];  Service: Urology;  Laterality: N/A;  . VENTRAL HERNIA REPAIR  06/09/2012   Procedure: LAPAROSCOPIC VENTRAL HERNIA;  Surgeon: Pedro Earls, MD;  Location: [facility];  Service: General;;  primary repair of ventral hernia     Medications: Current Meds  Medication Sig  . CALCIUM PO Take 500 mg by mouth 3 (three) times daily.   . Cyanocobalamin (B-12 PO) Take 1 tablet by mouth daily.   . hydrochlorothiazide (MICROZIDE) 12.5 MG capsule Take 12.5 mg by mouth daily.   Marland Kitchen levothyroxine (SYNTHROID, LEVOTHROID) 200 MCG tablet Take 200 mcg by mouth See admin instructions. Taking Synthroid 261mcg daily except on Monday and Fridays pt takes Synthroid 249mcg BID  . lisinopril (  PRINIVIL,ZESTRIL) 10 MG tablet Take 10 mg by mouth daily.  . metFORMIN (GLUCOPHAGE) 1000 MG tablet Take 1,000 mg by mouth daily with breakfast.   . Multiple Vitamin (MULTIVITAMIN WITH MINERALS) TABS Take 2 tablets by mouth daily.   Glory Rosebush VERIO test strip   . traMADol (ULTRAM) 50 MG tablet Take 25 mg by mouth every 6 (six) hours as needed (pain). Does not take daily only as needed and no more than 1 tablet.     Allergies: No Known Allergies  Social History: The patient  reports that he has never smoked. He has never used smokeless tobacco. He reports current alcohol use. He reports that he does not use drugs.   Family History: The patient's family history includes Breast cancer in his mother; Diabetes in his  brother, father, and maternal grandmother; Heart disease in his father; Hypertension in his brother and father.   Review of Systems: Please see the history of present illness.   All other systems are reviewed and negative.   Physical Exam: VS:  BP 118/76   Pulse 82   Ht 5\' 11"  (1.803 m)   Wt (!) 304 lb 12.8 oz (138.3 kg)   SpO2 96%   BMI 42.51 kg/m  .  BMI Body mass index is 42.51 kg/m.  Wt Readings from Last 3 Encounters:  01/21/19 (!) 304 lb 12.8 oz (138.3 kg)  12/09/18 (!) 307 lb 4.8 oz (139.4 kg)  12/03/18 (!) 307 lb 4.8 oz (139.4 kg)    General: Pleasant. Obese. Alert and in no acute distress.   HEENT: Normal.  Neck: Supple, no JVD, carotid bruits, or masses noted.  Cardiac: Regular rate and rhythm. Outflow murmur.  No edema.  Respiratory:  Lungs are clear to auscultation bilaterally with normal work of breathing.  GI: Soft and nontender.  MS: No deformity or atrophy. Gait and ROM intact.  Skin: Warm and dry. Color is normal.  Neuro:  Strength and sensation are intact and no gross focal deficits noted.  Psych: Alert, appropriate and with normal affect.   LABORATORY DATA:  EKG:  EKG is not ordered today.  Lab Results  Component Value Date   WBC 7.3 12/03/2018   HGB 11.4 (L) 12/03/2018   HCT 39.6 12/03/2018   PLT 242 12/03/2018   GLUCOSE 108 (H) 12/03/2018   CHOL 163 02/06/2012   TRIG 130 02/06/2012   HDL 49 02/06/2012   LDLCALC 88 02/06/2012   ALT 20 12/03/2018   AST 21 12/03/2018   NA 136 12/03/2018   K 4.4 12/03/2018   CL 103 12/03/2018   CREATININE 1.23 12/03/2018   BUN 26 (H) 12/03/2018   CO2 21 (L) 12/03/2018   TSH 3.586 02/06/2012   INR 0.9 12/03/2018   HGBA1C 7.4 (H) 06/09/2012       BNP (last 3 results) No results for input(s): BNP in the last 8760 hours.  ProBNP (last 3 results) No results for input(s): PROBNP in the last 8760 hours.   Other Studies Reviewed Today:  Myoview Study Highlights 08/2018  Addendum by Thayer Headings,  MD on Tue Aug 26, 2018 11:47 AM   Nuclear stress EF: 52%. Visually, the EF appears to be normal .  This is a low risk study. There is no evidence of ischemia or infarction  The study is normal.    Finalized by Thayer Headings, MD on Tue Aug 26, 2018 11:43 AM   ECHO Study Conclusions 02/2018  - Procedure narrative: Transthoracic echocardiography. Image  quality was suboptimal. The study was technically difficult.   Intravenous contrast (Definity) was administered to opacify the   LV. - Left ventricle: The cavity size was normal. Wall thickness was   increased in a pattern of mild LVH. Systolic function was   vigorous. The estimated ejection fraction was in the range of 65%   to 70%. Wall motion was normal; there were no regional wall   motion abnormalities. Doppler parameters are consistent with   abnormal left ventricular relaxation (grade 1 diastolic   dysfunction). The E/e&' ratio is between 8-15, suggesting   indeterminate LV filling pressure. - Aortic valve: Poorly visualized. Moderate stenosis. There was no   significant regurgitation. Mean gradient (S): 23 mm Hg. Peak   gradient (S): 50 mm Hg. Valve area (VTI): 1.56 cm^2. Valve area   (Vmax): 1.24 cm^2. Valve area (Vmean): 1.2 cm^2. - Mitral valve: Calcified annulus. Mildly thickened leaflets .   There was trivial regurgitation. - Left atrium: The atrium was normal in size.  Impressions:  - Technically difficult study. Definity contrast given. LVEF   65-70%, mild LVH, normal wall motion, grade 1 DD, indeterminate   LV filling pressure, moderate aortic stenosis - mean gradient of   23 mmHg - AVA around 1.3 cm2, normal LA size.  Assessment/Plan:  1. AS - needs echo updated - we will get rescheduled. He has no cardinal symptoms.   2. HTN - BP is fine - no changes made  3. HLD - labs later this month with PCP  4. DM - per PCP  5. Multiple CV risk factors - low risk Myoview from earlier this year noted.   6 .  COVID-19 Education: The signs and symptoms of COVID-19 were discussed with the patient and how to seek care for testing (follow up with PCP or arrange E-visit).  The importance of social distancing, staying at home, hand hygiene and wearing a mask when out in public were discussed today.  Current medicines are reviewed with the patient today.  The patient does not have concerns regarding medicines other than what has been noted above.  The following changes have been made:  See above.  Labs/ tests ordered today include:   No orders of the defined types were placed in this encounter.    Disposition:   FU with Dr. Johnsie Cancel tentatively in 6 months - this can be changed if needed based on echo results.   Patient is agreeable to this plan and will call if any problems develop in the interim.   SignedTruitt Merle, NP  01/21/2019 9:55 AM  [facility] [address] [place], [place]  [address] Phone: [phone] Fax: [phone]

## 2019-01-21 ENCOUNTER — Other Ambulatory Visit: Payer: Self-pay

## 2019-01-21 ENCOUNTER — Encounter: Payer: Self-pay | Admitting: Nurse Practitioner

## 2019-01-21 ENCOUNTER — Ambulatory Visit (HOSPITAL_COMMUNITY): Payer: PPO

## 2019-01-21 ENCOUNTER — Ambulatory Visit: Payer: PPO | Admitting: Nurse Practitioner

## 2019-01-21 DIAGNOSIS — I10 Essential (primary) hypertension: Secondary | ICD-10-CM

## 2019-01-21 DIAGNOSIS — I350 Nonrheumatic aortic (valve) stenosis: Secondary | ICD-10-CM | POA: Diagnosis not present

## 2019-01-21 NOTE — Patient Instructions (Addendum)
After Visit Summary:  We will be checking the following labs today - NONE   Medication Instructions:    Continue with your current medicines.    If you need a refill on your cardiac medications before your next appointment, please call your pharmacy.     Testing/Procedures To Be Arranged:  Echocardiogram to be rescheduled  Follow-Up:   See Dr. Johnsie Cancel tentatively in 6 months - this can be changed if needed based on the echo findings.     At [facility], you and your health needs are our priority.  As part of our continuing mission to provide you with exceptional heart care, we have created designated Provider Care Teams.  These Care Teams include your primary Cardiologist (physician) and Advanced Practice Providers (APPs -  Physician Assistants and Nurse Practitioners) who all work together to provide you with the care you need, when you need it.  Special Instructions:  . Stay safe, stay home, wash your hands for at least 20 seconds and wear a mask when out in public.  . It was good to talk with you today. . Symptoms to be aware of are: chest pain/shortness of breath/passing out spells    Call the [place] office at [phone] if you have any questions, problems or concerns.

## 2019-01-23 ENCOUNTER — Telehealth: Payer: Self-pay | Admitting: *Deleted

## 2019-01-23 ENCOUNTER — Other Ambulatory Visit: Payer: Self-pay

## 2019-01-23 ENCOUNTER — Ambulatory Visit (HOSPITAL_COMMUNITY): Payer: PPO | Attending: Cardiology

## 2019-01-23 DIAGNOSIS — I350 Nonrheumatic aortic (valve) stenosis: Secondary | ICD-10-CM | POA: Insufficient documentation

## 2019-01-23 DIAGNOSIS — I10 Essential (primary) hypertension: Secondary | ICD-10-CM | POA: Diagnosis not present

## 2019-01-23 DIAGNOSIS — Z6841 Body Mass Index (BMI) 40.0 and over, adult: Secondary | ICD-10-CM | POA: Insufficient documentation

## 2019-01-23 MED ORDER — PERFLUTREN LIPID MICROSPHERE
1.0000 mL | INTRAVENOUS | Status: AC | PRN
  Administered 2019-01-23: 2 mL via INTRAVENOUS

## 2019-01-23 NOTE — Telephone Encounter (Signed)
Pt has been notified of echo results by phone with verbal understanding. Pt is agreeable to repeat echo in 1 yr. Pt thanked me for the call. I will place order and recall today. The patient has been notified of the result and verbalized understanding.  All questions (if any) were answered. Julaine Hua, [place] 01/23/2019 3:01 PM

## 2019-01-23 NOTE — Telephone Encounter (Signed)
-----   Message from Josue Hector, MD sent at 01/23/2019  1:42 PM EDT ----- Moderate AS f/u echo in a year ef normal

## 2019-02-01 NOTE — Progress Notes (Signed)
  Radiation Oncology         [phone] ________________________________  Name: HARLEY ABRAHA MRN: [id number]  Date: 01/06/2019  DOB: [birth year]  3D Planning Note   Prostate Brachytherapy Post-Implant Dosimetry  Diagnosis: 68 y.o. gentleman with Stage T2a adenocarcinoma of the prostate with Gleason score of 3+4, and PSA of 5.54.  Narrative: On a previous date, KUMARI ABRUZZESE returned following prostate seed implantation for post implant planning. He underwent CT scan complex simulation to delineate the three-dimensional structures of the pelvis and demonstrate the radiation distribution.  Since that time, the seed localization, and complex isodose planning with dose volume histograms have now been completed.  Results:   Prostate Coverage - The dose of radiation delivered to the 90% or more of the prostate gland (D90) was 91.92% of the prescription dose. This exceeds our goal of greater than 90%. Rectal Sparing - The volume of rectal tissue receiving the prescription dose or higher was 0.0 cc. This falls under our thresholds tolerance of 1.0 cc.  Impression: The prostate seed implant appears to show adequate target coverage and appropriate rectal sparing.  Plan:  The patient will continue to follow with urology for ongoing PSA determinations. I would anticipate a high likelihood for local tumor control with minimal risk for rectal morbidity.  ________________________________  Sheral Apley Tammi Klippel, M.D.

## 2019-02-04 DIAGNOSIS — Z0000 Encounter for general adult medical examination without abnormal findings: Secondary | ICD-10-CM | POA: Diagnosis not present

## 2019-02-04 DIAGNOSIS — Z23 Encounter for immunization: Secondary | ICD-10-CM | POA: Diagnosis not present

## 2019-02-04 DIAGNOSIS — Z9884 Bariatric surgery status: Secondary | ICD-10-CM | POA: Diagnosis not present

## 2019-02-04 DIAGNOSIS — I350 Nonrheumatic aortic (valve) stenosis: Secondary | ICD-10-CM | POA: Diagnosis not present

## 2019-02-04 DIAGNOSIS — E1140 Type 2 diabetes mellitus with diabetic neuropathy, unspecified: Secondary | ICD-10-CM | POA: Diagnosis not present

## 2019-02-04 DIAGNOSIS — I10 Essential (primary) hypertension: Secondary | ICD-10-CM | POA: Diagnosis not present

## 2019-02-04 DIAGNOSIS — G4733 Obstructive sleep apnea (adult) (pediatric): Secondary | ICD-10-CM | POA: Diagnosis not present

## 2019-02-04 DIAGNOSIS — E039 Hypothyroidism, unspecified: Secondary | ICD-10-CM | POA: Diagnosis not present

## 2019-02-04 DIAGNOSIS — E785 Hyperlipidemia, unspecified: Secondary | ICD-10-CM | POA: Diagnosis not present

## 2019-02-04 DIAGNOSIS — C61 Malignant neoplasm of prostate: Secondary | ICD-10-CM | POA: Diagnosis not present

## 2019-02-04 DIAGNOSIS — E113299 Type 2 diabetes mellitus with mild nonproliferative diabetic retinopathy without macular edema, unspecified eye: Secondary | ICD-10-CM | POA: Diagnosis not present

## 2019-04-03 DIAGNOSIS — C61 Malignant neoplasm of prostate: Secondary | ICD-10-CM | POA: Diagnosis not present

## 2019-04-23 DIAGNOSIS — N4883 Acquired buried penis: Secondary | ICD-10-CM | POA: Diagnosis not present

## 2019-04-23 DIAGNOSIS — R972 Elevated prostate specific antigen [PSA]: Secondary | ICD-10-CM | POA: Diagnosis not present

## 2019-04-23 DIAGNOSIS — C61 Malignant neoplasm of prostate: Secondary | ICD-10-CM | POA: Diagnosis not present

## 2019-04-23 DIAGNOSIS — R8271 Bacteriuria: Secondary | ICD-10-CM | POA: Diagnosis not present

## 2019-08-10 ENCOUNTER — Ambulatory Visit: Payer: PPO | Attending: Internal Medicine

## 2019-08-10 DIAGNOSIS — Z23 Encounter for immunization: Secondary | ICD-10-CM | POA: Insufficient documentation

## 2019-08-10 NOTE — Progress Notes (Signed)
   Covid-19 Vaccination Clinic  Name:  Dillon Jones    MRN: [id number] DOB: September 14, 1950  08/10/2019  Dillon Jones was observed post Covid-19 immunization for 15 minutes without incidence. He was provided with Vaccine Information Sheet and instruction to access the V-Safe system.   Dillon Jones was instructed to call 911 with any severe reactions post vaccine: Marland Kitchen Difficulty breathing  . Swelling of your face and throat  . A fast heartbeat  . A bad rash all over your body  . Dizziness and weakness    Immunizations Administered    Name Date Dose VIS Date Route   Pfizer COVID-19 Vaccine 08/10/2019  8:24 AM 0.3 mL 05/22/2019 Intramuscular   Manufacturer: [place]   Lot: [id number]   [place]: [id number]

## 2019-09-02 DIAGNOSIS — I10 Essential (primary) hypertension: Secondary | ICD-10-CM | POA: Diagnosis not present

## 2019-09-02 DIAGNOSIS — E039 Hypothyroidism, unspecified: Secondary | ICD-10-CM | POA: Diagnosis not present

## 2019-09-02 DIAGNOSIS — E785 Hyperlipidemia, unspecified: Secondary | ICD-10-CM | POA: Diagnosis not present

## 2019-09-02 DIAGNOSIS — E119 Type 2 diabetes mellitus without complications: Secondary | ICD-10-CM | POA: Diagnosis not present

## 2019-09-02 DIAGNOSIS — G4733 Obstructive sleep apnea (adult) (pediatric): Secondary | ICD-10-CM | POA: Diagnosis not present

## 2019-09-08 ENCOUNTER — Ambulatory Visit: Payer: PPO | Attending: Internal Medicine

## 2019-09-08 DIAGNOSIS — Z23 Encounter for immunization: Secondary | ICD-10-CM

## 2019-09-08 NOTE — Progress Notes (Signed)
   Covid-19 Vaccination Clinic  Name:  Dillon Jones    MRN: [id number] DOB: 1950/07/05  09/08/2019  Mr. Dillon Jones was observed post Covid-19 immunization for 15 minutes without incident. He was provided with Vaccine Information Sheet and instruction to access the V-Safe system.   Dillon Jones was instructed to call 911 with any severe reactions post vaccine: Marland Kitchen Difficulty breathing  . Swelling of face and throat  . A fast heartbeat  . A bad rash all over body  . Dizziness and weakness   Immunizations Administered    Name Date Dose VIS Date Route   Pfizer COVID-19 Vaccine 09/08/2019  8:54 AM 0.3 mL 05/22/2019 Intramuscular   Manufacturer: [place]   Lot: [id number]   [place]: [id number]

## 2019-09-09 DIAGNOSIS — E785 Hyperlipidemia, unspecified: Secondary | ICD-10-CM | POA: Diagnosis not present

## 2019-09-09 DIAGNOSIS — C61 Malignant neoplasm of prostate: Secondary | ICD-10-CM | POA: Diagnosis not present

## 2019-09-09 DIAGNOSIS — E119 Type 2 diabetes mellitus without complications: Secondary | ICD-10-CM | POA: Diagnosis not present

## 2019-09-09 DIAGNOSIS — I350 Nonrheumatic aortic (valve) stenosis: Secondary | ICD-10-CM | POA: Diagnosis not present

## 2019-09-09 DIAGNOSIS — H6090 Unspecified otitis externa, unspecified ear: Secondary | ICD-10-CM | POA: Diagnosis not present

## 2019-09-09 DIAGNOSIS — G4733 Obstructive sleep apnea (adult) (pediatric): Secondary | ICD-10-CM | POA: Diagnosis not present

## 2019-09-09 DIAGNOSIS — I10 Essential (primary) hypertension: Secondary | ICD-10-CM | POA: Diagnosis not present

## 2019-09-16 DIAGNOSIS — G4733 Obstructive sleep apnea (adult) (pediatric): Secondary | ICD-10-CM | POA: Diagnosis not present

## 2019-10-15 DIAGNOSIS — G4733 Obstructive sleep apnea (adult) (pediatric): Secondary | ICD-10-CM | POA: Diagnosis not present

## 2019-12-22 DIAGNOSIS — C61 Malignant neoplasm of prostate: Secondary | ICD-10-CM | POA: Diagnosis not present

## 2019-12-30 DIAGNOSIS — Z8546 Personal history of malignant neoplasm of prostate: Secondary | ICD-10-CM | POA: Diagnosis not present

## 2019-12-30 DIAGNOSIS — R351 Nocturia: Secondary | ICD-10-CM | POA: Diagnosis not present

## 2019-12-30 DIAGNOSIS — R3915 Urgency of urination: Secondary | ICD-10-CM | POA: Diagnosis not present

## 2020-01-21 DIAGNOSIS — E119 Type 2 diabetes mellitus without complications: Secondary | ICD-10-CM | POA: Diagnosis not present

## 2020-01-21 DIAGNOSIS — H35033 Hypertensive retinopathy, bilateral: Secondary | ICD-10-CM | POA: Diagnosis not present

## 2020-01-21 DIAGNOSIS — H2513 Age-related nuclear cataract, bilateral: Secondary | ICD-10-CM | POA: Diagnosis not present

## 2020-01-21 DIAGNOSIS — H40013 Open angle with borderline findings, low risk, bilateral: Secondary | ICD-10-CM | POA: Diagnosis not present

## 2020-01-21 DIAGNOSIS — H524 Presbyopia: Secondary | ICD-10-CM | POA: Diagnosis not present

## 2020-01-21 LAB — HM DIABETES EYE EXAM

## 2020-02-11 DIAGNOSIS — E538 Deficiency of other specified B group vitamins: Secondary | ICD-10-CM | POA: Diagnosis not present

## 2020-02-11 DIAGNOSIS — I10 Essential (primary) hypertension: Secondary | ICD-10-CM | POA: Diagnosis not present

## 2020-02-11 DIAGNOSIS — E119 Type 2 diabetes mellitus without complications: Secondary | ICD-10-CM | POA: Diagnosis not present

## 2020-02-11 DIAGNOSIS — E785 Hyperlipidemia, unspecified: Secondary | ICD-10-CM | POA: Diagnosis not present

## 2020-02-11 DIAGNOSIS — Z23 Encounter for immunization: Secondary | ICD-10-CM | POA: Diagnosis not present

## 2020-02-11 DIAGNOSIS — Z1211 Encounter for screening for malignant neoplasm of colon: Secondary | ICD-10-CM | POA: Diagnosis not present

## 2020-02-11 DIAGNOSIS — Z0000 Encounter for general adult medical examination without abnormal findings: Secondary | ICD-10-CM | POA: Diagnosis not present

## 2020-02-22 DIAGNOSIS — Z1211 Encounter for screening for malignant neoplasm of colon: Secondary | ICD-10-CM | POA: Diagnosis not present

## 2020-05-27 ENCOUNTER — Other Ambulatory Visit: Payer: Self-pay

## 2020-05-27 ENCOUNTER — Ambulatory Visit (HOSPITAL_COMMUNITY): Payer: PPO | Attending: Internal Medicine

## 2020-05-27 DIAGNOSIS — I350 Nonrheumatic aortic (valve) stenosis: Secondary | ICD-10-CM

## 2020-05-27 LAB — ECHOCARDIOGRAM COMPLETE
AR max vel: 0.85 cm2
AV Area VTI: 0.85 cm2
AV Area mean vel: 0.88 cm2
AV Mean grad: 27 mmHg
AV Peak grad: 48.2 mmHg
Ao pk vel: 3.47 m/s
Area-P 1/2: 3.48 cm2
P 1/2 time: 435 msec
S' Lateral: 3.6 cm

## 2020-05-27 MED ORDER — PERFLUTREN LIPID MICROSPHERE
1.0000 mL | INTRAVENOUS | Status: AC | PRN
  Administered 2020-05-27: 1 mL via INTRAVENOUS

## 2020-09-12 DIAGNOSIS — G4733 Obstructive sleep apnea (adult) (pediatric): Secondary | ICD-10-CM | POA: Diagnosis not present

## 2020-10-03 DIAGNOSIS — Z1211 Encounter for screening for malignant neoplasm of colon: Secondary | ICD-10-CM | POA: Diagnosis not present

## 2020-10-03 DIAGNOSIS — R195 Other fecal abnormalities: Secondary | ICD-10-CM | POA: Diagnosis not present

## 2021-01-23 DIAGNOSIS — M170 Bilateral primary osteoarthritis of knee: Secondary | ICD-10-CM | POA: Diagnosis not present

## 2021-02-14 DIAGNOSIS — E785 Hyperlipidemia, unspecified: Secondary | ICD-10-CM | POA: Diagnosis not present

## 2021-02-14 DIAGNOSIS — E538 Deficiency of other specified B group vitamins: Secondary | ICD-10-CM | POA: Diagnosis not present

## 2021-02-14 DIAGNOSIS — M179 Osteoarthritis of knee, unspecified: Secondary | ICD-10-CM | POA: Diagnosis not present

## 2021-02-14 DIAGNOSIS — I10 Essential (primary) hypertension: Secondary | ICD-10-CM | POA: Diagnosis not present

## 2021-02-14 DIAGNOSIS — E611 Iron deficiency: Secondary | ICD-10-CM | POA: Diagnosis not present

## 2021-02-14 DIAGNOSIS — C61 Malignant neoplasm of prostate: Secondary | ICD-10-CM | POA: Diagnosis not present

## 2021-02-14 DIAGNOSIS — Z0000 Encounter for general adult medical examination without abnormal findings: Secondary | ICD-10-CM | POA: Diagnosis not present

## 2021-02-14 DIAGNOSIS — E559 Vitamin D deficiency, unspecified: Secondary | ICD-10-CM | POA: Diagnosis not present

## 2021-02-14 DIAGNOSIS — I350 Nonrheumatic aortic (valve) stenosis: Secondary | ICD-10-CM | POA: Diagnosis not present

## 2021-02-14 DIAGNOSIS — Z9884 Bariatric surgery status: Secondary | ICD-10-CM | POA: Diagnosis not present

## 2021-02-14 DIAGNOSIS — E039 Hypothyroidism, unspecified: Secondary | ICD-10-CM | POA: Diagnosis not present

## 2021-02-14 DIAGNOSIS — E1140 Type 2 diabetes mellitus with diabetic neuropathy, unspecified: Secondary | ICD-10-CM | POA: Diagnosis not present

## 2021-02-24 IMAGING — DX CHEST - 2 VIEW
2 series · 2 of 2 positions shown · non-contrast
Comparison: 03/03/2012

CLINICAL DATA: Preop for prostate seed implant.

EXAM:
CHEST - 2 VIEW

[chest pa]
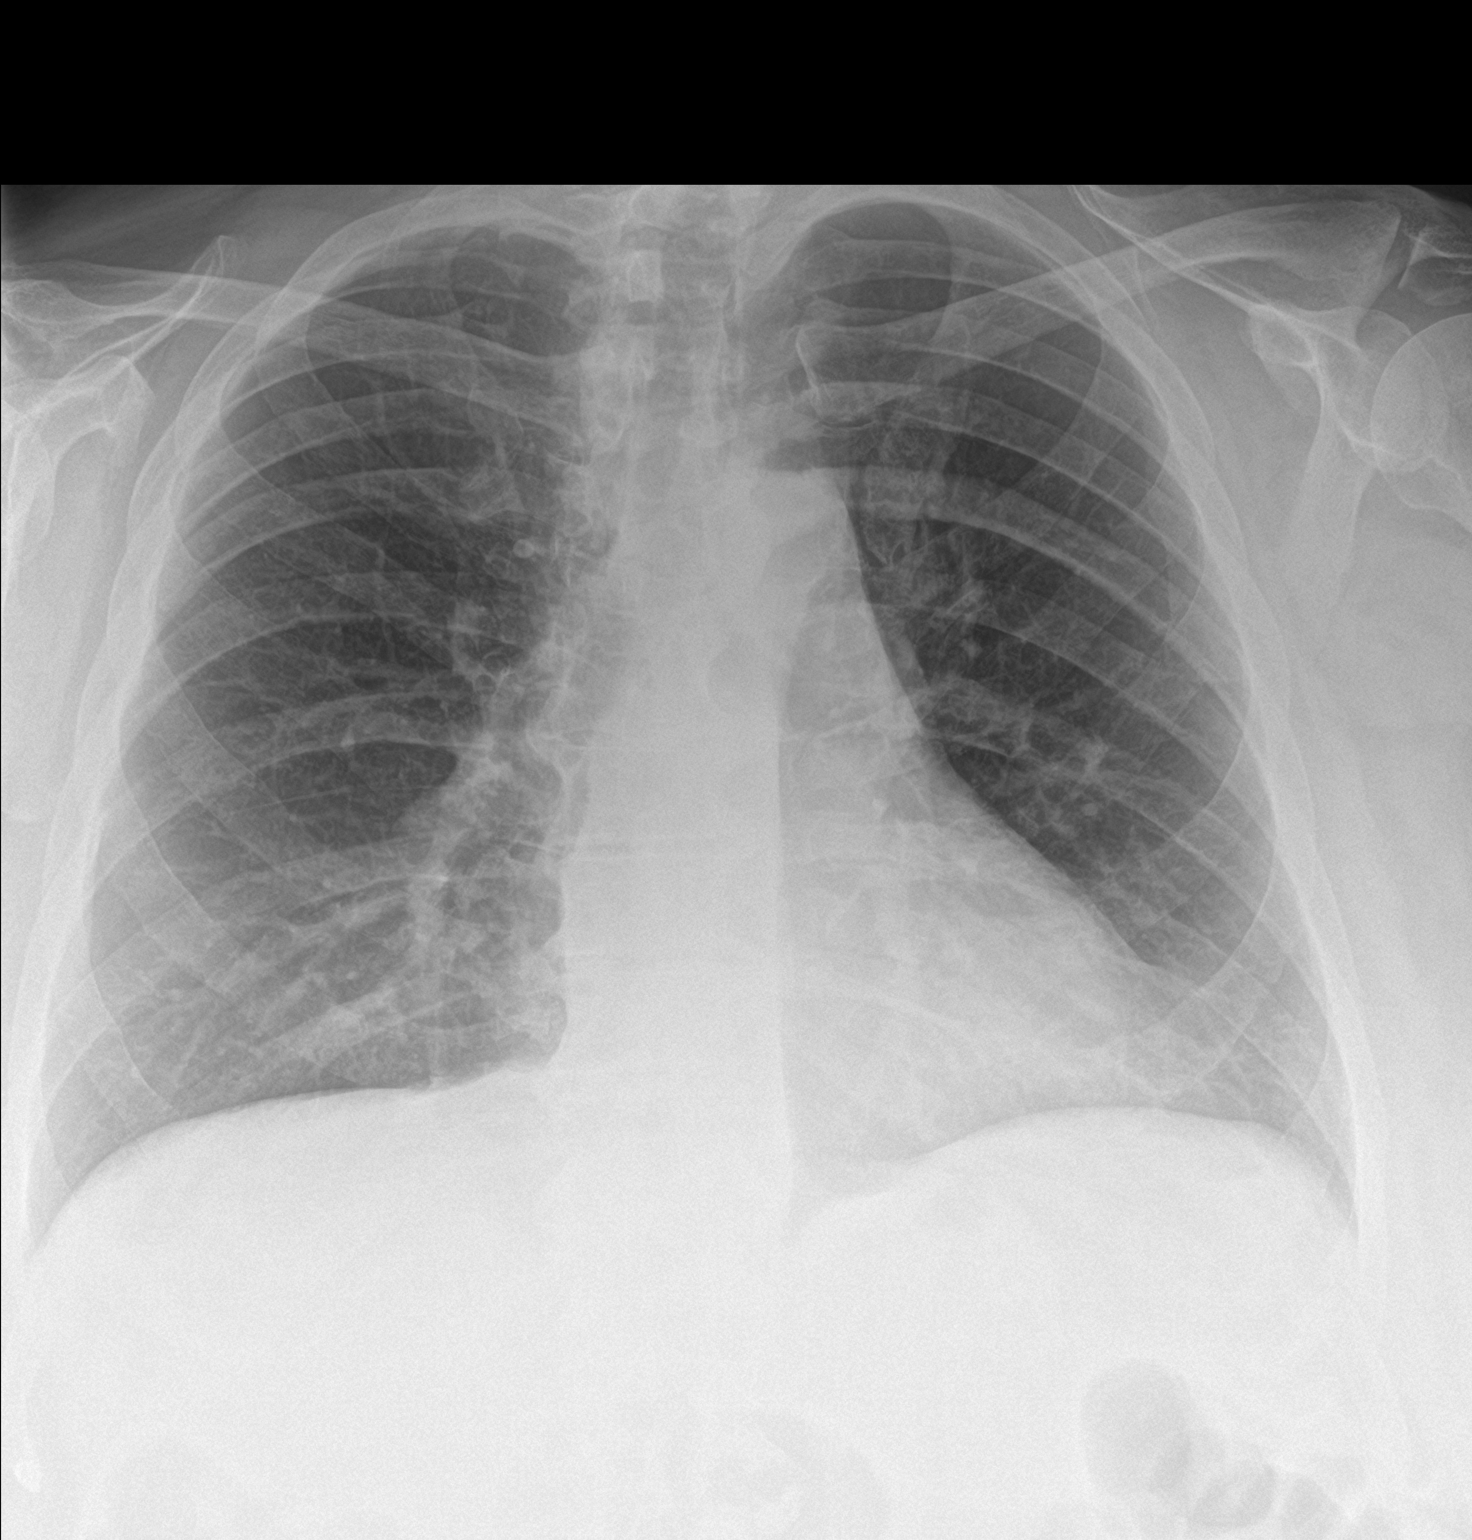

[chest lat]
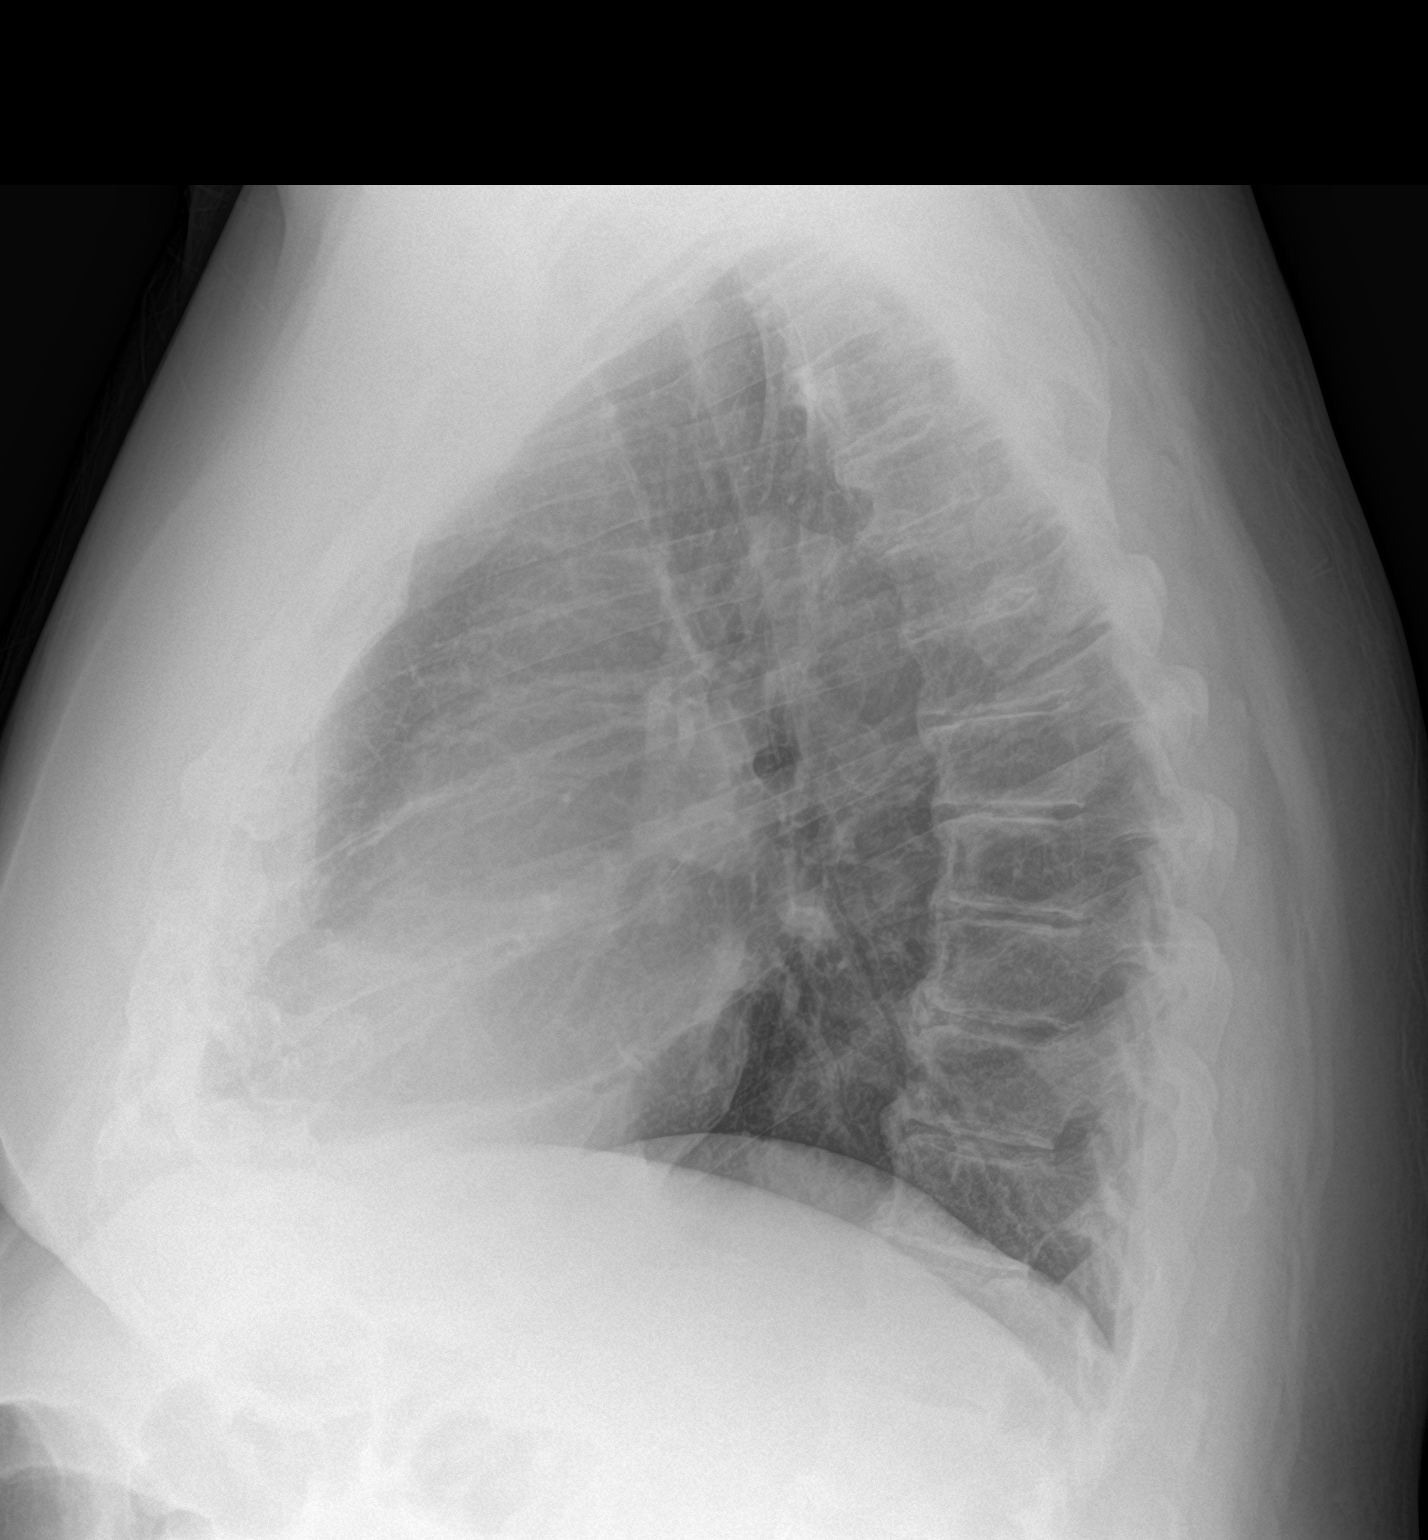

[2 of 2 positions shown; findings below may reference images not displayed]

FINDINGS: Both lungs are clear. Heart and mediastinum are within normal
limits. Trachea is midline. Degenerative changes in the thoracic
spine. No pleural effusions.
IMPRESSION: No active cardiopulmonary disease.

## 2021-04-24 DIAGNOSIS — M170 Bilateral primary osteoarthritis of knee: Secondary | ICD-10-CM | POA: Diagnosis not present

## 2021-07-07 DIAGNOSIS — K429 Umbilical hernia without obstruction or gangrene: Secondary | ICD-10-CM | POA: Diagnosis not present

## 2021-07-11 ENCOUNTER — Other Ambulatory Visit: Payer: Self-pay | Admitting: Surgery

## 2021-07-11 DIAGNOSIS — K429 Umbilical hernia without obstruction or gangrene: Secondary | ICD-10-CM

## 2021-07-24 DIAGNOSIS — M170 Bilateral primary osteoarthritis of knee: Secondary | ICD-10-CM | POA: Diagnosis not present

## 2021-10-23 DIAGNOSIS — M170 Bilateral primary osteoarthritis of knee: Secondary | ICD-10-CM | POA: Diagnosis not present

## 2022-01-22 DIAGNOSIS — M170 Bilateral primary osteoarthritis of knee: Secondary | ICD-10-CM | POA: Diagnosis not present

## 2022-03-19 DIAGNOSIS — H5213 Myopia, bilateral: Secondary | ICD-10-CM | POA: Diagnosis not present

## 2022-03-26 DIAGNOSIS — H35033 Hypertensive retinopathy, bilateral: Secondary | ICD-10-CM | POA: Diagnosis not present

## 2022-03-26 DIAGNOSIS — H25813 Combined forms of age-related cataract, bilateral: Secondary | ICD-10-CM | POA: Diagnosis not present

## 2022-03-26 DIAGNOSIS — E119 Type 2 diabetes mellitus without complications: Secondary | ICD-10-CM | POA: Diagnosis not present

## 2022-03-26 DIAGNOSIS — H40013 Open angle with borderline findings, low risk, bilateral: Secondary | ICD-10-CM | POA: Diagnosis not present

## 2022-03-26 DIAGNOSIS — H524 Presbyopia: Secondary | ICD-10-CM | POA: Diagnosis not present

## 2022-03-26 LAB — HM DIABETES EYE EXAM

## 2022-09-05 DIAGNOSIS — M170 Bilateral primary osteoarthritis of knee: Secondary | ICD-10-CM | POA: Diagnosis not present

## 2022-09-10 ENCOUNTER — Telehealth: Payer: Self-pay | Admitting: *Deleted

## 2022-09-10 NOTE — Telephone Encounter (Signed)
Patient last seen in office was 01/2019 and will need a new patient appointment since has been a period of time since last office visit. Will route to scheduling pool.

## 2022-09-10 NOTE — Telephone Encounter (Signed)
   Name: Dillon Jones  DOB: [birth year]  MRN: [id number]  Primary Cardiologist: None  Chart reviewed as part of pre-operative protocol coverage. Because of Oshua L Minar's past medical history and time since last visit, he will require a follow-up in-office visit in order to better assess preoperative cardiovascular risk.  Pre-op covering staff: - Please schedule appointment and call patient to inform them. If patient already had an upcoming appointment within acceptable timeframe, please add "pre-op clearance" to the appointment notes so provider is aware. - Please contact requesting surgeon's office via preferred method (i.e, phone, fax) to inform them of need for appointment prior to surgery.   Mable Fill, Marissa Nestle, NP  09/10/2022, 3:37 PM

## 2022-09-10 NOTE — Telephone Encounter (Signed)
   Pre-operative Risk Assessment    Patient Name: Dillon Jones  DOB: May 15, 1951 MRN: [id number]      Request for Surgical Clearance    Procedure:   Right Total Knee Replacement  Date of Surgery:  Clearance TBD                                 Surgeon:  Dr. Marchia Bond Surgeon's Group or Practice Name:  [facility] Phone number:  [phone] Fax number:  [phone]   Type of Clearance Requested:   - Medical    Type of Anesthesia:   Choice   Additional requests/questions:    Signed, Greer Ee   09/10/2022, 3:32 PM

## 2022-09-13 ENCOUNTER — Ambulatory Visit: Payer: PPO | Attending: Cardiology | Admitting: Cardiology

## 2022-09-13 ENCOUNTER — Encounter: Payer: Self-pay | Admitting: Cardiology

## 2022-09-13 VITALS — BP 130/73 | HR 78 | Ht 71.0 in | Wt 292.2 lb

## 2022-09-13 DIAGNOSIS — I350 Nonrheumatic aortic (valve) stenosis: Secondary | ICD-10-CM | POA: Diagnosis not present

## 2022-09-13 DIAGNOSIS — E119 Type 2 diabetes mellitus without complications: Secondary | ICD-10-CM | POA: Diagnosis not present

## 2022-09-13 DIAGNOSIS — Z6841 Body Mass Index (BMI) 40.0 and over, adult: Secondary | ICD-10-CM | POA: Diagnosis not present

## 2022-09-13 DIAGNOSIS — I10 Essential (primary) hypertension: Secondary | ICD-10-CM

## 2022-09-13 DIAGNOSIS — Z01810 Encounter for preprocedural cardiovascular examination: Secondary | ICD-10-CM | POA: Diagnosis not present

## 2022-09-13 DIAGNOSIS — E7800 Pure hypercholesterolemia, unspecified: Secondary | ICD-10-CM

## 2022-09-13 NOTE — Progress Notes (Signed)
Cardiology Office Note:   Date:  09/13/2022  ID:  Dillon Jones, DOB 15-Jan-1951, MRN [id number]  History of Present Illness:   Dillon Jones is a 72 y.o. male with moderate aortic stenosis on TTE in 2021, DMII, GERD, HLD, HTN, history of gastric bypass, OSA on CPAP, who was referred by Dr. Justin Mend for pre-op evaluation prior to right knee replacement.  Was previously followed by Cardiology but has been lost to follow-up. TTE in 2021 with moderate aortic stenosis with mean gradient 32mmHg, Vmax 3.5. Myoview 2020 low risk with normal perfusion.  Today, the patient overall feels well aside from joint pain. No chest pain, SOB, LE edema, orthopnea, PND, or palpitations. No lightheadedness, dizziness or syncope. He ambulates with a walker but has to take frequent breaks due to right knee pain. Unable to complete >4METs of activity. States he did not know he was supposed to continue care with Cardiology but is willing to follow with [place] going forward.  History of gastric bypass and lost 160lbs. Has managed to keep off most of the weight off (was >400lbs at the time of surgery).  States BP is running mainly 130/70 at home.   Past Medical History:  Diagnosis Date   Anemia    Arthritis    Diabetes mellitus    Has had for 13 years, type 2   Diabetes mellitus-type II 02/06/2012   GERD (gastroesophageal reflux disease)    Occasional - only 1x 0000000   H/O umbilical hernia repair-mesh used; done in [place] 02/06/2012   Hypercholesteremia 02/06/2012   Hyperlipidemia    Hypertension    Hypothyroidism    Lap Roux Y Gastric Bypass Dec 2013 06/09/2012   Morbid obesity    Morbid obesity with BMI of 55.8 06/05/2012   OSA on CPAP 02/06/2012   Prostate cancer    Sleep apnea      ROS: As per HPI  Studies Reviewed:    EKG:  NSR, poor r-wave progression  Cardiac Studies & Procedures     STRESS TESTS  MYOCARDIAL PERFUSION IMAGING 08/26/2018  Narrative  Nuclear stress EF: 52%. Visually, the EF appears to  be normal .  This is a low risk study. There is no evidence of ischemia or infarction  The study is normal.   ECHOCARDIOGRAM  ECHOCARDIOGRAM COMPLETE 05/27/2020  Narrative ECHOCARDIOGRAM REPORT    Patient Name:   Dillon Jones Date of Exam: 05/27/2020 Medical Rec #:  [id number]      Height:       71.0 in Accession #:    [id number]     Weight:       304.8 lb Date of Birth:  12/26/1950      BSA:          2.522 m Patient Age:    28 years       BP:           156/100 mmHg Patient Gender: M              HR:           75 bpm. Exam Location:  [place]  Procedure: 2D Echo, Cardiac Doppler, Color Doppler and Intracardiac Opacification Agent  Indications:    I35 Aortic stenosis.  History:        Patient has prior history of Echocardiogram examinations, most recent 01/23/2019. Risk Factors:Hypertension, Diabetes, Dyslipidemia and Morbid obesity. Anemia. Prostate cancer.  Sonographer:    Jessee Avers, RDCS Referring Phys: [place]  Sonographer Comments: Technically difficult study due to poor echo windows and suboptimal apical window. Image acquisition challenging due to patient body habitus. IMPRESSIONS   1. The aortic valve is tricuspid. There is moderate calcification of the aortic valve, with severely reduced cusp separation. Aortic valve regurgitation is trivial. Moderate aortic valve stenosis, mean gradient 27 mmHg. Valve area by TVI 0.85 cm2. Vmax 3.5 m/s. 2. Left ventricular ejection fraction, by estimation, is 60 to 65%. The left ventricle has normal function. The left ventricle has no regional wall motion abnormalities. There is mild left ventricular hypertrophy. Diastolic function low normal for age. 3. Right ventricular systolic function is normal. The right ventricular size is normal. Tricuspid regurgitation signal is inadequate for assessing PA pressure. 4. The mitral valve is normal in structure. Trivial mitral valve regurgitation. No evidence of mitral  stenosis. 5. The inferior vena cava is normal in size with greater than 50% respiratory variability, suggesting right atrial pressure of 3 mmHg.  Comparison(s): No significant change from prior study. Comparison made to prior report - 01/23/19 EF 60-65%. Moderate AS 22mmHg mean gradient.  FINDINGS Left Ventricle: Left ventricular ejection fraction, by estimation, is 60 to 65%. The left ventricle has normal function. The left ventricle has no regional wall motion abnormalities. Definity contrast agent was given IV to delineate the left ventricular endocardial borders. The left ventricular internal cavity size was normal in size. There is mild left ventricular hypertrophy. Diastolic function low normal for age.  Right Ventricle: The right ventricular size is normal. No increase in right ventricular wall thickness. Right ventricular systolic function is normal. Tricuspid regurgitation signal is inadequate for assessing PA pressure.  Left Atrium: Left atrial size was normal in size.  Right Atrium: Right atrial size was normal in size.  Pericardium: There is no evidence of pericardial effusion.  Mitral Valve: The mitral valve is normal in structure. Mild mitral annular calcification. Trivial mitral valve regurgitation. No evidence of mitral valve stenosis.  Tricuspid Valve: The tricuspid valve is normal in structure. Tricuspid valve regurgitation is trivial. No evidence of tricuspid stenosis.  Aortic Valve: The aortic valve is tricuspid. There is moderate calcification of the aortic valve. Aortic valve regurgitation is trivial. Aortic regurgitation PHT measures 435 msec. Moderate aortic stenosis is present. Aortic valve mean gradient measures 27.0 mmHg. Aortic valve peak gradient measures 48.2 mmHg. Aortic valve area, by VTI measures 0.85 cm.  Pulmonic Valve: The pulmonic valve was grossly normal. Pulmonic valve regurgitation is trivial. No evidence of pulmonic stenosis.  Aorta: Aortic root  measures 39 mm, which is likely upper limit of normal for age when indexed to [place]. The aortic root is normal in size and structure.  Venous: The inferior vena cava is normal in size with greater than 50% respiratory variability, suggesting right atrial pressure of 3 mmHg.  IAS/Shunts: No atrial level shunt detected by color flow Doppler.   LEFT VENTRICLE PLAX 2D LVIDd:         5.10 cm  Diastology LVIDs:         3.60 cm  LV e' medial:    5.93 cm/s LV PW:         1.10 cm  LV E/e' medial:  13.9 LV IVS:        1.10 cm  LV e' lateral:   10.60 cm/s LVOT diam:     2.00 cm  LV E/e' lateral: 7.8 LV SV:         66 LV SV Index:   26 LVOT  Area:     3.14 cm   RIGHT VENTRICLE RV Basal diam:  4.00 cm RV Mid diam:    4.40 cm RV S prime:     11.40 cm/s TAPSE (M-mode): 1.6 cm  LEFT ATRIUM             Index       RIGHT ATRIUM           Index LA diam:        4.20 cm 1.67 cm/m  RA Pressure: 3.00 mmHg LA Vol (A2C):   37.2 ml 14.75 ml/m RA Area:     13.20 cm LA Vol (A4C):   40.7 ml 16.14 ml/m RA Volume:   31.60 ml  12.53 ml/m LA Biplane Vol: 39.4 ml 15.62 ml/m AORTIC VALVE AV Area (Vmax):    0.85 cm AV Area (Vmean):   0.88 cm AV Area (VTI):     0.85 cm AV Vmax:           347.00 cm/s AV Vmean:          233.667 cm/s AV VTI:            0.777 m AV Peak Grad:      48.2 mmHg AV Mean Grad:      27.0 mmHg LVOT Vmax:         94.00 cm/s LVOT Vmean:        65.500 cm/s LVOT VTI:          0.209 m LVOT/AV VTI ratio: 0.27 AI PHT:            435 msec  AORTA Ao Root diam: 3.90 cm Ao Asc diam:  3.80 cm  MITRAL VALVE               TRICUSPID VALVE Estimated RAP:  3.00 mmHg  MV E velocity: 82.70 cm/s  SHUNTS MV A velocity: 84.00 cm/s  Systemic VTI:  0.21 m MV E/A ratio:  0.98        Systemic Diam: 2.00 cm  Cherlynn Kaiser MD Electronically signed by Cherlynn Kaiser MD Signature Date/Time: 05/27/2020/9:29:51 AM    Final              Risk Assessment/Calculations:               Physical Exam:   VS:  BP 130/73   Pulse 78   Ht 5\' 11"  (1.803 m)   Wt 292 lb 3.2 oz (132.5 kg)   SpO2 98%   BMI 40.75 kg/m    Wt Readings from Last 3 Encounters:  09/13/22 292 lb 3.2 oz (132.5 kg)  01/21/19 (!) 304 lb 12.8 oz (138.3 kg)  12/09/18 (!) 307 lb 4.8 oz (139.4 kg)     GEN: Well nourished, well developed in no acute distress NECK: No JVD; No carotid bruits CARDIAC: RRR, 2/6 mid-peaking harsh systolic murmur RESPIRATORY:  Clear to auscultation without rales, wheezing or rhonchi  ABDOMEN: Soft, non-tender, non-distended EXTREMITIES:  No edema; No deformity   ASSESSMENT AND PLAN:    #Pre-Op Evaluation: -Patient unable to complete >4METs due to joint pain -Also with moderate AS in 2021 with no recent TTE -Will check TTE and myoview prior to OR -If no high risk findings, can proceed with surgery without further testing  #Moderate AS: -Noted on TTE in 2021 with LVEF 60-65%, moderate AS with mean gradient 66mmHg, Vmax 3.10m/s -Was lost to follow-up and has not had a repeat TTE since that time -Will repeat TTE for  monitoring  #HTN: -Continue HCTZ 12.5mg  daily -Continue lisinopril 10mg  daily -Running mainly 130/70 at home  #HLD: -No recent labs in our system -Currently on pravastatin per PCP (has not tolerated other statins or higher doses) -Has repeat labs with PCP on 09/18/22; goal LDL<70 -Declined Ca score today; may consider in the future  #DMII: -Management per PCP -A1C currently controlled at 5.8  #Morbid Obesity: -S/p gastric bypass in 2013 and lost >100lbs (was >400lbs at that time) -BMI currently 40 -Working on weight loss efforts      Shared Decision Making/Informed Consent The risks [chest pain, shortness of breath, cardiac arrhythmias, dizziness, blood pressure fluctuations, myocardial infarction, stroke/transient ischemic attack, nausea, vomiting, allergic reaction, radiation exposure, metallic taste sensation and life-threatening  complications (estimated to be 1 in 10,000)], benefits (risk stratification, diagnosing coronary artery disease, treatment guidance) and alternatives of a nuclear stress test were discussed in detail with Mr. Morelan and he agrees to proceed.   Signed, Freada Bergeron, MD

## 2022-09-13 NOTE — Patient Instructions (Signed)
Medication Instructions:  Your physician recommends that you continue on your current medications as directed. Please refer to the Current Medication list given to you today.  *If you need a refill on your cardiac medications before your next appointment, please call your pharmacy*  Testing/Procedures: Your physician has requested that you have an echocardiogram. Echocardiography is a painless test that uses sound waves to create images of your heart. It provides your doctor with information about the size and shape of your heart and how well your heart's chambers and valves are working. This procedure takes approximately one hour. There are no restrictions for this procedure. Please do NOT wear cologne, perfume, aftershave, or lotions (deodorant is allowed). Please arrive 15 minutes prior to your appointment time.  Your physician has requested that you have a lexiscan myoview. For further information please visit [email or web address]. Please follow instruction sheet, as given.  Follow-Up: At [facility], you and your health needs are our priority.  As part of our continuing mission to provide you with exceptional heart care, we have created designated Provider Care Teams.  These Care Teams include your primary Cardiologist (physician) and Advanced Practice Providers (APPs -  Physician Assistants and Nurse Practitioners) who all work together to provide you with the care you need, when you need it.  Your next appointment:   6 month(s)  Provider:   Freada Bergeron, MD

## 2022-09-18 ENCOUNTER — Ambulatory Visit (INDEPENDENT_AMBULATORY_CARE_PROVIDER_SITE_OTHER): Payer: PPO | Admitting: Family Medicine

## 2022-09-18 ENCOUNTER — Telehealth (HOSPITAL_COMMUNITY): Payer: Self-pay | Admitting: *Deleted

## 2022-09-18 ENCOUNTER — Encounter: Payer: Self-pay | Admitting: Family Medicine

## 2022-09-18 VITALS — BP 126/70 | HR 78 | Temp 98.0°F | Ht 71.0 in | Wt 286.4 lb

## 2022-09-18 DIAGNOSIS — M1990 Unspecified osteoarthritis, unspecified site: Secondary | ICD-10-CM | POA: Diagnosis not present

## 2022-09-18 DIAGNOSIS — G4733 Obstructive sleep apnea (adult) (pediatric): Secondary | ICD-10-CM | POA: Diagnosis not present

## 2022-09-18 DIAGNOSIS — I10 Essential (primary) hypertension: Secondary | ICD-10-CM

## 2022-09-18 DIAGNOSIS — C61 Malignant neoplasm of prostate: Secondary | ICD-10-CM | POA: Diagnosis not present

## 2022-09-18 DIAGNOSIS — E7800 Pure hypercholesterolemia, unspecified: Secondary | ICD-10-CM | POA: Diagnosis not present

## 2022-09-18 DIAGNOSIS — E039 Hypothyroidism, unspecified: Secondary | ICD-10-CM | POA: Diagnosis not present

## 2022-09-18 DIAGNOSIS — Z1159 Encounter for screening for other viral diseases: Secondary | ICD-10-CM

## 2022-09-18 DIAGNOSIS — E119 Type 2 diabetes mellitus without complications: Secondary | ICD-10-CM | POA: Diagnosis not present

## 2022-09-18 DIAGNOSIS — Z9884 Bariatric surgery status: Secondary | ICD-10-CM | POA: Diagnosis not present

## 2022-09-18 LAB — COMPREHENSIVE METABOLIC PANEL
ALT: 12 U/L (ref 0–53)
AST: 14 U/L (ref 0–37)
Albumin: 4.1 g/dL (ref 3.5–5.2)
Alkaline Phosphatase: 58 U/L (ref 39–117)
BUN: 20 mg/dL (ref 6–23)
CO2: 21 mEq/L (ref 19–32)
Calcium: 9.6 mg/dL (ref 8.4–10.5)
Chloride: 99 mEq/L (ref 96–112)
Creatinine, Ser: 1.19 mg/dL (ref 0.40–1.50)
GFR: 62.27 mL/min (ref >60.00)
Glucose, Bld: 95 mg/dL (ref 70–99)
Potassium: 4.3 mEq/L (ref 3.5–5.1)
Sodium: 134 mEq/L — ABNORMAL LOW (ref 135–145)
Total Bilirubin: 0.5 mg/dL (ref 0.2–1.2)
Total Protein: 6.9 g/dL (ref 6.0–8.3)

## 2022-09-18 LAB — CBC WITH DIFFERENTIAL/PLATELET
Basophils Absolute: 0 10*3/uL (ref 0.0–0.1)
Basophils Relative: 0.6 % (ref 0.0–3.0)
Eosinophils Absolute: 0.1 10*3/uL (ref 0.0–0.7)
Eosinophils Relative: 2.1 % (ref 0.0–5.0)
HCT: 41.3 % (ref 39.0–52.0)
Hemoglobin: 13.8 g/dL (ref 13.0–17.0)
Lymphocytes Relative: 17.2 % (ref 12.0–46.0)
Lymphs Abs: 0.9 10*3/uL (ref 0.7–4.0)
MCHC: 33.3 g/dL (ref 30.0–36.0)
MCV: 88.2 fl (ref 78.0–100.0)
Monocytes Absolute: 0.5 10*3/uL (ref 0.1–1.0)
Monocytes Relative: 10.1 % (ref 3.0–12.0)
Neutro Abs: 3.8 10*3/uL (ref 1.4–7.7)
Neutrophils Relative %: 70 % (ref 43.0–77.0)
Platelets: 201 10*3/uL (ref 150.0–400.0)
RBC: 4.69 Mil/uL (ref 4.22–5.81)
RDW: 14.9 % (ref 11.5–15.5)
WBC: 5.4 10*3/uL (ref 4.0–10.5)

## 2022-09-18 LAB — HEMOGLOBIN A1C: Hgb A1c MFr Bld: 5.5 % (ref 4.6–6.5)

## 2022-09-18 LAB — MICROALBUMIN / CREATININE URINE RATIO
Creatinine,U: 49.1 mg/dL
Microalb Creat Ratio: 6.1 mg/g (ref 0.0–30.0)
Microalb, Ur: 3 mg/dL — ABNORMAL HIGH (ref 0.0–1.9)

## 2022-09-18 LAB — VITAMIN B12: Vitamin B-12: 792 pg/mL (ref 211–911)

## 2022-09-18 LAB — IBC + FERRITIN
Ferritin: 17.9 ng/mL — ABNORMAL LOW (ref 22.0–322.0)
Iron: 114 ug/dL (ref 42–165)
Saturation Ratios: 23.9 % (ref 20.0–50.0)
TIBC: 477.4 ug/dL — ABNORMAL HIGH (ref 250.0–450.0)
Transferrin: 341 mg/dL (ref 212.0–360.0)

## 2022-09-18 LAB — VITAMIN D 25 HYDROXY (VIT D DEFICIENCY, FRACTURES): VITD: 31.72 ng/mL (ref 30.00–100.00)

## 2022-09-18 LAB — MAGNESIUM: Magnesium: 1.7 mg/dL (ref 1.5–2.5)

## 2022-09-18 MED ORDER — METFORMIN HCL 1000 MG PO TABS: 1000.0000 mg | ORAL_TABLET | Freq: Every day | ORAL | 1 refills | Status: DC

## 2022-09-18 MED ORDER — HYDROCHLOROTHIAZIDE 12.5 MG PO CAPS: 12.5000 mg | ORAL_CAPSULE | Freq: Every day | ORAL | 1 refills | Status: DC

## 2022-09-18 MED ORDER — LEVOTHYROXINE SODIUM 200 MCG PO TABS: 200.0000 ug | ORAL_TABLET | ORAL | 1 refills | Status: DC

## 2022-09-18 MED ORDER — LISINOPRIL 10 MG PO TABS: 10.0000 mg | ORAL_TABLET | Freq: Every day | ORAL | 1 refills | Status: DC

## 2022-09-18 MED ORDER — PRAVASTATIN SODIUM 10 MG PO TABS: 10.0000 mg | ORAL_TABLET | Freq: Every day | ORAL | 1 refills | Status: DC

## 2022-09-18 NOTE — Progress Notes (Signed)
New Patient Office Visit  Subjective:  Patient ID: Dillon Jones, male    DOB: 10-17-1950  Age: 72 y.o. MRN: [id number]  CC:  Chief Complaint  Patient presents with   Establish Care    Initial visit to establish care with new pcp Need medication refills    HPI Dillon Jones presents for new patient-medications and clearance  Aortic stenosis-some dyspnea on exertion-not sure if from knee pain DM type 2-last saw PCP 2 years ago. Controlled since gastric bypass 2013 and has been controlled.  Last checked 2 years ago.  Ophth 2-3 month(s) ago HYPERTENSION-Pt is on lisinopril 10 mg and hydrochloroTHIAZIDE 12.5  Bp's running 130's/70's  No ha/dizziness/cp/palp/edema/shortness of breath. Occasional cough. Limited activity from knee OA  HLD-on pravastatin.  Intol to several others and higher doses of pravastatin-knees ache more.  Out of medications for 1 week(s).Marland Kitchen.   OSA-wears regularly. History of gastric bypass-lost 160# Right knee osteoarthritis-needing clearance for TKA.  Saw Card.   Hypothyroidism-on 200-but out for 2 weeks. Prostate cancer-has seeds.  Seeing urological next week(s).     Past Medical History:  Diagnosis Date   Anemia    Arthritis    Diabetes mellitus    Has had for 13 years, type 2   Diabetes mellitus-type II 02/06/2012   GERD (gastroesophageal reflux disease)    Occasional - only 1x q7225m   H/O umbilical hernia repair-mesh used; done in [place] 02/06/2012   Hypercholesteremia 02/06/2012   Hyperlipidemia    Hypertension    Hypothyroidism    Lap Roux Y Gastric Bypass Dec 2013 06/09/2012   Morbid obesity    Morbid obesity with BMI of 55.8 06/05/2012   OSA on CPAP 02/06/2012   Prostate cancer    radioactive seeds   Sleep apnea     Past Surgical History:  Procedure Laterality Date   BREATH TEK H PYLORI  04/22/2012   Procedure: BREATH TEK H PYLORI;  Surgeon: Valarie MerinoMatthew B Martin, MD;  [facility];  Service: General;  Laterality: N/A;   GASTRIC  ROUX-EN-Y  06/09/2012   Procedure: LAPAROSCOPIC ROUX-EN-Y GASTRIC;  Surgeon: Valarie MerinoMatthew B Martin, MD;  Location: [facility];  Service: General;  Laterality: N/A;   HERNIA REPAIR  1992   umbilical   PROSTATE BIOPSY     RADIOACTIVE SEED IMPLANT N/A 12/09/2018   Procedure: RADIOACTIVE SEED IMPLANT/BRACHYTHERAPY IMPLANT;  Surgeon: Bjorn PippinWrenn, John, MD;  Location: [facility];  Service: Urology;  Laterality: N/A;   right knee arthroscopy  1999   SPACE OAR INSTILLATION N/A 12/09/2018   Procedure: SPACE OAR INSTILLATION;  Surgeon: Bjorn PippinWrenn, John, MD;  Location: [facility];  Service: Urology;  Laterality: N/A;   VENTRAL HERNIA REPAIR  06/09/2012   Procedure: LAPAROSCOPIC VENTRAL HERNIA;  Surgeon: Valarie MerinoMatthew B Martin, MD;  Location: [facility];  Service: General;;  primary repair of ventral hernia    Family History  Problem Relation Age of Onset   Breast cancer Mother    Vision loss Mother    Varicose Veins Mother    Heart disease Father    Diabetes Father    Hypertension Father    Obesity Father    Varicose Veins Father    Diabetes Brother        2 brothers   Hypertension Brother    Obesity Brother    Diabetes Maternal Grandmother    Obesity Brother    Stroke Brother    Prostate cancer Neg Hx    Colon cancer Neg Hx  Pancreatic cancer Neg Hx     Social History   Socioeconomic History   Marital status: Married    Spouse name: Not on file   Number of children: 3   Years of education: Not on file   Highest education level: Not on file  Occupational History    Comment: working full time  Tobacco Use   Smoking status: Never   Smokeless tobacco: Never  Vaping Use   Vaping Use: Never used  Substance and Sexual Activity   Alcohol use: Yes    Alcohol/week: 10.0 standard drinks of alcohol    Types: 2 Glasses of wine, 8 Shots of liquor per week    Comment: 2 shots sev times/wk   Drug use: No   Sexual activity: Not Currently    Birth control/protection: Abstinence  Other Topics Concern   Not on file   Social History Narrative   Had 3 children but 1 passed away.  No grands   Retired Nurse, adult   Social Determinants of Corporate investment banker Strain: Not on file  Food Insecurity: Not on file  Transportation Needs: Not on file  Physical Activity: Not on file  Stress: Not on file  Social Connections: Not on file  Intimate Partner Violence: Not on file    ROS  ROS: Gen: no fever, chills  Skin: no rash, itching ENT: no ear pain, some allergies Eyes: no blurry vision, double vision Resp:HPI CV: no CP, palpitations, LE edema,  GI: no heartburn, n/v/d/c, abd pain.  Occasional loose stools from surgery GU: some urgency incont.  "Burried" penis MSK: HPI Neuro: no dizziness, headache, weakness, vertigo Psych: no depression, anxiety, insomnia, SI   Objective:   Today's Vitals: BP 126/70   Pulse 78   Temp 98 F (36.7 C) (Temporal)   Ht  (1.803 m)   Wt 286 lb 6 oz (129.9 kg)   SpO2 99%   BMI 39.94 kg/m   Physical Exam  Gen: [facility] NAD OWM HEENT: NCAT, conjunctiva not injected, sclera nonicteric NECK:  supple, no thyromegaly, no nodes, no carotid bruits CARDIAC: RRR, S1S2+, +2/6 harsh sys murmur. DP 2+B LUNGS: CTAB. No wheezes ABDOMEN:  BS+, soft, NTND, No HSM, no masses EXT:  no edema MSK: walker.  NEURO: A&O x3.  CN II-XII intact.  PSYCH: normal mood. Good eye contact   Assessment & Plan:   Problem List Items Addressed This Visit       Cardiovascular and Mediastinum   Hypertension   Relevant Medications   pravastatin (PRAVACHOL) 10 MG tablet   Other Relevant Orders   Comprehensive metabolic panel   CBC with Differential/Platelet     Respiratory   OSA on CPAP     Endocrine   Diabetes mellitus-type II - Primary   Relevant Medications   pravastatin (PRAVACHOL) 10 MG tablet   Other Relevant Orders   Hemoglobin A1c   Microalbumin / creatinine urine ratio   Hypothyroidism     Musculoskeletal and Integument   Arthritis      Genitourinary   Prostate cancer     Other   Hypercholesteremia   Relevant Medications   pravastatin (PRAVACHOL) 10 MG tablet   Lap Roux Y Gastric Bypass Dec 2013   Relevant Orders   IBC + Ferritin   Magnesium   Vitamin B12   VITAMIN D 25 Hydroxy (Vit-D Deficiency, Fractures)   Other Visit Diagnoses     Encounter for hepatitis C screening test for low risk patient  Relevant Orders   Hepatitis C antibody     1  DM type 2-chronic.  Well controlled from gastric bypass.  Patient remains on metformin 1000mg  daily.  Check A1C,CMP, microalb creat ratio.  Get copy ophth exam 2.  HYPERTENSION-chronic.  Well controlled.  Continue lisinopril 10 mg and hydrochloroTHIAZIDE 12.5 mg.  Check CBC,CMP 3.  HLD-chronic.  Intol statins.  Doing fair on pravachol 10 mg.  Since off for 1 week(s), will restart and check cholesterol in 2 months w/thyroid 4.  Hypothyroidism-chronic.  Out of medications for 2 weeks.  Renewed synthroid 0.2 mg.  Check TSH 2 month(s). 5.  OSA-chronic.  Compliant w/CPAP 6.  Prostate Cancer-chronic.  Has seeds.  Will see urological soon.   7.  Status post gastric bypass.  Taking supps.  Check iron, B12, D 8.  Osteoarthritis B knees-going for total knee arthroplasty / replacement when cleared-awaiting Card input.  Will clear medically once labs back.    Follow up lipids, TSH in 2 month(s), me in 6 mo  Outpatient Encounter Medications as of 09/18/2022  Medication Sig   CALCIUM PO Take 500 mg by mouth 3 (three) times daily.    Cyanocobalamin (B-12 PO) Take 1 tablet by mouth daily.    hydrochlorothiazide (MICROZIDE) 12.5 MG capsule Take 12.5 mg by mouth daily.    levothyroxine (SYNTHROID, LEVOTHROID) 200 MCG tablet Take 200 mcg by mouth See admin instructions. Taking Synthroid daily except on Monday and Fridays pt takes Synthroid BID   lisinopril (PRINIVIL,ZESTRIL) 10 MG tablet Take 10 mg by mouth daily.   metFORMIN (GLUCOPHAGE) 1000 MG tablet Take 1,000 mg by  mouth daily with breakfast.    Multiple Vitamin (MULTIVITAMIN WITH MINERALS) TABS Take 2 tablets by mouth daily.    pravastatin (PRAVACHOL) 10 MG tablet Take 10 mg by mouth daily.   No facility-administered encounter medications on file as of 09/18/2022.    Follow-up: Return in about 6 months (around 03/20/2023) for me in 41mo-dm, htn,   but labs in 2 mo.   Angelena Sole, MD

## 2022-09-18 NOTE — Telephone Encounter (Signed)
Patient given detailed instructions per Myocardial Perfusion Study Information Sheet for the test on 09/24/22 at 8:15. Patient notified to arrive 15 minutes early and that it is imperative to arrive on time for appointment to keep from having the test rescheduled.  If you need to cancel or reschedule your appointment, please call the office within 24 hours of your appointment. . Patient verbalized understanding.Daneil Dolin

## 2022-09-18 NOTE — Patient Instructions (Signed)
Welcome to [facility] at [facility]! It was a pleasure meeting you today.  As discussed, Please schedule a 6 month follow up visit today.  PLEASE NOTE:  If you had any LAB tests please let us know if you have not heard back within a few days. You may see your results on MyChart before we have a chance to review them but we will give you a call once they are reviewed by us. If we ordered any REFERRALS today, please let us know if you have not heard from their office within the next week.  Let us know through MyChart if you are needing REFILLS, or have your pharmacy send us the request. You can also use MyChart to communicate with me or any office staff.  Please try these tips to maintain a healthy lifestyle:  Eat most of your calories during the day when you are active. Eliminate processed foods including packaged sweets (pies, cakes, cookies), reduce intake of potatoes, white bread, white pasta, and white rice. Look for whole grain options, oat flour or almond flour.  Each meal should contain half fruits/vegetables, one quarter protein, and one quarter carbs (no bigger than a computer mouse).  Cut down on sweet beverages. This includes juice, soda, and sweet tea. Also watch fruit intake, though this is a healthier sweet option, it still contains natural sugar! Limit to 3 servings daily.  Drink at least 1 glass of water with each meal and aim for at least 8 glasses per day  Exercise at least 150 minutes every week.   

## 2022-09-19 DIAGNOSIS — Z8546 Personal history of malignant neoplasm of prostate: Secondary | ICD-10-CM | POA: Diagnosis not present

## 2022-09-19 LAB — HEPATITIS C ANTIBODY: Hepatitis C Ab: NONREACTIVE

## 2022-09-19 LAB — PSA: PSA: 0.1

## 2022-09-20 NOTE — Progress Notes (Signed)
Labs look great!  Except iron stores are low.  Needs to make sure taking vitamin with iron

## 2022-09-24 ENCOUNTER — Other Ambulatory Visit: Payer: Self-pay | Admitting: Family Medicine

## 2022-09-24 ENCOUNTER — Ambulatory Visit (HOSPITAL_COMMUNITY): Payer: PPO | Attending: Internal Medicine

## 2022-09-24 DIAGNOSIS — I350 Nonrheumatic aortic (valve) stenosis: Secondary | ICD-10-CM | POA: Insufficient documentation

## 2022-09-24 DIAGNOSIS — Z01810 Encounter for preprocedural cardiovascular examination: Secondary | ICD-10-CM | POA: Insufficient documentation

## 2022-09-24 DIAGNOSIS — E7800 Pure hypercholesterolemia, unspecified: Secondary | ICD-10-CM | POA: Insufficient documentation

## 2022-09-24 DIAGNOSIS — Z6841 Body Mass Index (BMI) 40.0 and over, adult: Secondary | ICD-10-CM | POA: Diagnosis not present

## 2022-09-24 LAB — MYOCARDIAL PERFUSION IMAGING
Estimated workload: 1
Exercise duration (sec): 0 s

## 2022-09-24 MED ORDER — REGADENOSON 0.4 MG/5ML IV SOLN
0.4000 mg | Freq: Once | INTRAVENOUS | Status: AC
Start: 2022-09-24 — End: 2022-09-24
  Administered 2022-09-24: 0.4 mg via INTRAVENOUS

## 2022-09-24 MED ORDER — TECHNETIUM TC 99M TETROFOSMIN IV KIT
32.0000 | PACK | Freq: Once | INTRAVENOUS | Status: AC | PRN
  Administered 2022-09-24: 32 via INTRAVENOUS

## 2022-09-25 ENCOUNTER — Telehealth: Payer: Self-pay

## 2022-09-25 ENCOUNTER — Ambulatory Visit (HOSPITAL_COMMUNITY): Payer: PPO

## 2022-09-25 LAB — MYOCARDIAL PERFUSION IMAGING
Exercise duration (min): 1 min
LV dias vol: 91 mL (ref 62–150)
LV sys vol: 41 mL
MPHR: 151 {beats}/min
Nuc Stress EF: 54 %
Peak HR: 105 {beats}/min
Percent HR: 69 %
Rest HR: 90 {beats}/min
Rest Nuclear Isotope Dose: 31.1 mCi
SDS: 5
SRS: 1
SSS: 6
ST Depression (mm): 0 mm
Stress Nuclear Isotope Dose: 32 mCi
TID: 0.83

## 2022-09-25 MED ORDER — TECHNETIUM TC 99M TETROFOSMIN IV KIT
31.1000 | PACK | Freq: Once | INTRAVENOUS | Status: AC | PRN
  Administered 2022-09-25: 31.1 via INTRAVENOUS

## 2022-09-25 NOTE — Telephone Encounter (Signed)
Patient verbalizes understanding of normal stress test results.

## 2022-09-25 NOTE — Telephone Encounter (Signed)
-----   Message from Meriam Sprague, MD sent at 09/25/2022  2:31 PM EDT ----- His stress test is normal. No further testing needed prior to his surgery. This is great news!

## 2022-09-26 ENCOUNTER — Encounter: Payer: Self-pay | Admitting: Family Medicine

## 2022-09-26 DIAGNOSIS — N4883 Acquired buried penis: Secondary | ICD-10-CM | POA: Diagnosis not present

## 2022-09-26 DIAGNOSIS — Z8546 Personal history of malignant neoplasm of prostate: Secondary | ICD-10-CM | POA: Diagnosis not present

## 2022-09-26 DIAGNOSIS — N3941 Urge incontinence: Secondary | ICD-10-CM | POA: Diagnosis not present

## 2022-09-27 ENCOUNTER — Other Ambulatory Visit: Payer: Self-pay | Admitting: Family Medicine

## 2022-09-27 MED ORDER — KETOCONAZOLE 2 % EX CREA: 1.0000 | TOPICAL_CREAM | Freq: Two times a day (BID) | CUTANEOUS | 2 refills | Status: DC

## 2022-10-04 ENCOUNTER — Encounter: Payer: Self-pay | Admitting: Family Medicine

## 2022-10-11 ENCOUNTER — Ambulatory Visit (HOSPITAL_COMMUNITY)
Admission: RE | Admit: 2022-10-11 | Discharge: 2022-10-11 | Disposition: A | Discharge status: Home / Self Care | Payer: PPO | Source: Ambulatory Visit | Attending: Cardiology | Admitting: Cardiology

## 2022-10-11 DIAGNOSIS — K219 Gastro-esophageal reflux disease without esophagitis: Secondary | ICD-10-CM | POA: Diagnosis not present

## 2022-10-11 DIAGNOSIS — I10 Essential (primary) hypertension: Secondary | ICD-10-CM | POA: Diagnosis not present

## 2022-10-11 DIAGNOSIS — I350 Nonrheumatic aortic (valve) stenosis: Secondary | ICD-10-CM | POA: Diagnosis not present

## 2022-10-11 DIAGNOSIS — E119 Type 2 diabetes mellitus without complications: Secondary | ICD-10-CM | POA: Insufficient documentation

## 2022-10-11 DIAGNOSIS — I083 Combined rheumatic disorders of mitral, aortic and tricuspid valves: Secondary | ICD-10-CM | POA: Insufficient documentation

## 2022-10-11 DIAGNOSIS — G4730 Sleep apnea, unspecified: Secondary | ICD-10-CM | POA: Insufficient documentation

## 2022-10-11 DIAGNOSIS — E785 Hyperlipidemia, unspecified: Secondary | ICD-10-CM | POA: Diagnosis not present

## 2022-10-11 DIAGNOSIS — E7800 Pure hypercholesterolemia, unspecified: Secondary | ICD-10-CM

## 2022-10-11 LAB — ECHOCARDIOGRAM COMPLETE
AR max vel: 1.01 cm2
AV Area VTI: 1.07 cm2
AV Area mean vel: 0.96 cm2
AV Mean grad: 33 mmHg
AV Peak grad: 59 mmHg
AV Vena cont: 0.3 cm
Ao pk vel: 3.84 m/s
Area-P 1/2: 2.93 cm2
Calc EF: 57.7 %
P 1/2 time: 563 msec
S' Lateral: 3.6 cm
Single Plane A2C EF: 59 %
Single Plane A4C EF: 56.5 %

## 2022-10-11 NOTE — Progress Notes (Signed)
Echocardiogram 2D Echocardiogram has been performed.  Augustine Radar 10/11/2022, 1:57 PM

## 2022-10-12 ENCOUNTER — Encounter (HOSPITAL_COMMUNITY): Payer: Self-pay

## 2022-10-12 ENCOUNTER — Telehealth: Payer: Self-pay | Admitting: *Deleted

## 2022-10-12 DIAGNOSIS — I350 Nonrheumatic aortic (valve) stenosis: Secondary | ICD-10-CM

## 2022-10-12 NOTE — Telephone Encounter (Signed)
     Primary Cardiologist: Meriam Sprague, MD  Chart reviewed as part of pre-operative protocol coverage. Given past medical history and time since last visit, based on ACC/AHA guidelines, Dillon Jones would be at acceptable risk for the planned procedure without further cardiovascular testing.   Per Dr. Shari Prows " No further testing needed prior to OR. Myoview without ischemia. TTE with moderate AS."  I will route this recommendation to the requesting party via Epic fax function and remove from pre-op pool.  Please call with questions.  Thomasene Ripple. Bryelle Spiewak NP-C  \   10/12/2022, 11:46 AM [facility] [address] Office [phone] Fax [phone]

## 2022-10-12 NOTE — Telephone Encounter (Signed)
-----   Message from Meriam Sprague, MD sent at 10/12/2022  8:11 AM EDT ----- Her echo shows normal pumping function. The aortic valve is moderately narrowed. Will need repeat TTE in 1 year for continued monitoring.

## 2022-10-12 NOTE — Telephone Encounter (Signed)
The patient has been notified of the result and verbalized understanding.  All questions (if any) were answered.  Pt aware he will need a repeat echo done in one year for surveillance.   He is aware that our echo scheduler will reach out to him soon to get that appt made.   Pt verbalized understanding and agrees with this plan.   Pt did want to ask Dr. Shari Prows if this would be the final test in clearing him for his upcoming procedure.  Pt is aware that I will send the original cardiac clearance note to Dr. Shari Prows to further review and advise on this matter, and we will get any final notes back to our pre-op team/requesting Surgeon's office thereafter.  Pt verbalized understanding and agrees with this plan

## 2022-10-12 NOTE — Telephone Encounter (Signed)
Misha, Giangregorio - 09/10/2022  3:31 PM Meriam Sprague, MD  Sent: Caleen Essex Oct 12, 2022 11:12 AM  To: Loa Socks, LPN         Message  No further testing needed prior to OR. Myoview without ischemia. TTE with moderate AS.      Will send this note per Dr. Shari Prows (as indicated above) to our pre-op pool for any final notes and routing to the requesting Surgeon's office thereafter.

## 2022-10-17 ENCOUNTER — Telehealth: Payer: Self-pay | Admitting: Family Medicine

## 2022-10-17 NOTE — Telephone Encounter (Signed)
Contacted Dillon Jones to schedule their annual wellness visit. Appointment made for 10/18/2022.  Gabriel Cirri [facility] AW[facility] TEAM Direct Dial [phone]

## 2022-10-18 ENCOUNTER — Ambulatory Visit (INDEPENDENT_AMBULATORY_CARE_PROVIDER_SITE_OTHER): Payer: PPO

## 2022-10-18 VITALS — Wt 283.0 lb

## 2022-10-18 DIAGNOSIS — Z0000 Encounter for general adult medical examination without abnormal findings: Secondary | ICD-10-CM | POA: Diagnosis not present

## 2022-10-18 NOTE — Patient Instructions (Signed)
Dillon Jones , Thank you for taking time to come for your Medicare Wellness Visit. I appreciate your ongoing commitment to your health goals. Please review the following plan we discussed and let me know if I can assist you in the future.   These are the goals we discussed:  Goals      Patient Stated     Lose weight         This is a list of the screening recommended for you and due dates:  Health Maintenance  Topic Date Due   Complete foot exam   Never done   Eye exam for diabetics  Never done   DTaP/Tdap/Td vaccine (1 - Tdap) Never done   Zoster (Shingles) Vaccine (2 of 2) 01/21/2023*   Pneumonia Vaccine (1 of 1 - PCV) 01/22/2023*   COVID-19 Vaccine (4 - 2023-24 season) 01/22/2023*   Flu Shot  01/10/2023   Hemoglobin A1C  03/20/2023   Yearly kidney function blood test for diabetes  09/18/2023   Yearly kidney health urinalysis for diabetes  09/18/2023   Medicare Annual Wellness Visit  10/18/2023   Colon Cancer Screening  10/04/2030   Hepatitis C Screening: USPSTF Recommendation to screen - Ages 18-79 yo.  Completed   HPV Vaccine  Aged Out  *Topic was postponed. The date shown is not the original due date.    Advanced directives: Advance directive discussed with you today. Even though you declined this today please call our office should you change your mind and we can give you the proper paperwork for you to fill out.  Conditions/risks identified: continue to lose weight   Next appointment: Follow up in one year for your annual wellness visit.   Preventive Care 70 Years and Older, Male  Preventive care refers to lifestyle choices and visits with your health care provider that can promote health and wellness. What does preventive care include? A yearly physical exam. This is also called an annual well check. Dental exams once or twice a year. Routine eye exams. Ask your health care provider how often you should have your eyes checked. Personal lifestyle choices,  including: Daily care of your teeth and gums. Regular physical activity. Eating a healthy diet. Avoiding tobacco and drug use. Limiting alcohol use. Practicing safe sex. Taking low doses of aspirin every day. Taking vitamin and mineral supplements as recommended by your health care provider. What happens during an annual well check? The services and screenings done by your health care provider during your annual well check will depend on your age, overall health, lifestyle risk factors, and family history of disease. Counseling  Your health care provider may ask you questions about your: Alcohol use. Tobacco use. Drug use. Emotional well-being. Home and relationship well-being. Sexual activity. Eating habits. History of falls. Memory and ability to understand (cognition). Work and work Astronomer. Screening  You may have the following tests or measurements: Height, weight, and BMI. Blood pressure. Lipid and cholesterol levels. These may be checked every 5 years, or more frequently if you are over 62 years old. Skin check. Lung cancer screening. You may have this screening every year starting at age 9 if you have a 30-pack-year history of smoking and currently smoke or have quit within the past 15 years. Fecal occult blood test (FOBT) of the stool. You may have this test every year starting at age 44. Flexible sigmoidoscopy or colonoscopy. You may have a sigmoidoscopy every 5 years or a colonoscopy every 10 years starting at age  50. Prostate cancer screening. Recommendations will vary depending on your family history and other risks. Hepatitis C blood test. Hepatitis B blood test. Sexually transmitted disease (STD) testing. Diabetes screening. This is done by checking your blood sugar (glucose) after you have not eaten for a while (fasting). You may have this done every 1-3 years. Abdominal aortic aneurysm (AAA) screening. You may need this if you are a current or former  smoker. Osteoporosis. You may be screened starting at age 70 if you are at high risk. Talk with your health care provider about your test results, treatment options, and if necessary, the need for more tests. Vaccines  Your health care provider may recommend certain vaccines, such as: Influenza vaccine. This is recommended every year. Tetanus, diphtheria, and acellular pertussis (Tdap, Td) vaccine. You may need a Td booster every 10 years. Zoster vaccine. You may need this after age 70. Pneumococcal 13-valent conjugate (PCV13) vaccine. One dose is recommended after age 70. Pneumococcal polysaccharide (PPSV23) vaccine. One dose is recommended after age 70. Talk to your health care provider about which screenings and vaccines you need and how often you need them. This information is not intended to replace advice given to you by your health care provider. Make sure you discuss any questions you have with your health care provider. Document Released: 06/24/2015 Document Revised: 02/15/2016 Document Reviewed: 03/29/2015 Elsevier Interactive Patient Education  2017 [place] Prevention in the Home Falls can cause injuries. They can happen to people of all ages. There are many things you can do to make your home safe and to help prevent falls. What can I do on the outside of my home? Regularly fix the edges of walkways and driveways and fix any cracks. Remove anything that might make you trip as you walk through a door, such as a raised step or threshold. Trim any bushes or trees on the path to your home. Use bright outdoor lighting. Clear any walking paths of anything that might make someone trip, such as rocks or tools. Regularly check to see if handrails are loose or broken. Make sure that both sides of any steps have handrails. Any raised decks and porches should have guardrails on the edges. Have any leaves, snow, or ice cleared regularly. Use sand or salt on walking paths during  winter. Clean up any spills in your garage right away. This includes oil or grease spills. What can I do in the bathroom? Use night lights. Install grab bars by the toilet and in the tub and shower. Do not use towel bars as grab bars. Use non-skid mats or decals in the tub or shower. If you need to sit down in the shower, use a plastic, non-slip stool. Keep the floor dry. Clean up any water that spills on the floor as soon as it happens. Remove soap buildup in the tub or shower regularly. Attach bath mats securely with double-sided non-slip rug tape. Do not have throw rugs and other things on the floor that can make you trip. What can I do in the bedroom? Use night lights. Make sure that you have a light by your bed that is easy to reach. Do not use any sheets or blankets that are too big for your bed. They should not hang down onto the floor. Have a firm chair that has side arms. You can use this for support while you get dressed. Do not have throw rugs and other things on the floor that can make you  trip. What can I do in the kitchen? Clean up any spills right away. Avoid walking on wet floors. Keep items that you use a lot in easy-to-reach places. If you need to reach something above you, use a strong step stool that has a grab bar. Keep electrical cords out of the way. Do not use floor polish or wax that makes floors slippery. If you must use wax, use non-skid floor wax. Do not have throw rugs and other things on the floor that can make you trip. What can I do with my stairs? Do not leave any items on the stairs. Make sure that there are handrails on both sides of the stairs and use them. Fix handrails that are broken or loose. Make sure that handrails are as long as the stairways. Check any carpeting to make sure that it is firmly attached to the stairs. Fix any carpet that is loose or worn. Avoid having throw rugs at the top or bottom of the stairs. If you do have throw rugs,  attach them to the floor with carpet tape. Make sure that you have a light switch at the top of the stairs and the bottom of the stairs. If you do not have them, ask someone to add them for you. What else can I do to help prevent falls? Wear shoes that: Do not have high heels. Have rubber bottoms. Are comfortable and fit you well. Are closed at the toe. Do not wear sandals. If you use a stepladder: Make sure that it is fully opened. Do not climb a closed stepladder. Make sure that both sides of the stepladder are locked into place. Ask someone to hold it for you, if possible. Clearly mark and make sure that you can see: Any grab bars or handrails. First and last steps. Where the edge of each step is. Use tools that help you move around (mobility aids) if they are needed. These include: Canes. Walkers. Scooters. Crutches. Turn on the lights when you go into a dark area. Replace any light bulbs as soon as they burn out. Set up your furniture so you have a clear path. Avoid moving your furniture around. If any of your floors are uneven, fix them. If there are any pets around you, be aware of where they are. Review your medicines with your doctor. Some medicines can make you feel dizzy. This can increase your chance of falling. Ask your doctor what other things that you can do to help prevent falls. This information is not intended to replace advice given to you by your health care provider. Make sure you discuss any questions you have with your health care provider. Document Released: 03/24/2009 Document Revised: 11/03/2015 Document Reviewed: 07/02/2014 Elsevier Interactive Patient Education  2017 [employer].

## 2022-10-18 NOTE — Progress Notes (Signed)
I connected with  Dillon Jones on 10/18/22 by a audio enabled telemedicine application and verified that I am speaking with the correct person using two identifiers.  Patient Location: Home  Provider Location: Office/Clinic  I discussed the limitations of evaluation and management by telemedicine. The patient expressed understanding and agreed to proceed.     Patient Medicare AWV questionnaire was completed by the patient on 10/18/22; I have confirmed that all information answered by patient is correct and no changes since this date.     Subjective:   Dillon Jones is a 72 y.o. male who presents for Medicare Annual/Subsequent preventive examination.  Review of Systems     Cardiac Risk Factors include: advanced age (>48men, >60 women);diabetes mellitus;obesity (BMI >30kg/m2);dyslipidemia;hypertension;male gender     Objective:    Today's Vitals   10/18/22 0932  Weight: 283 lb (128.4 kg)   Body mass index is 39.47 kg/m.     10/18/2022    9:38 AM 01/01/2019    2:56 PM 01/01/2019    2:50 PM 12/09/2018    6:06 AM 12/03/2018    1:54 PM 06/10/2012   12:52 PM 05/29/2012   10:42 AM  Advanced Directives  Does Patient Have a Medical Advance Directive? No No No No No Patient does not have advance directive Patient does not have advance directive;Patient would like information  Would patient like information on creating a medical advance directive? No - Patient declined Yes (MAU/Ambulatory/Procedural Areas - Information given) No - Patient declined No - Patient declined No - Patient declined  Advance directive packet given  Pre-existing out of facility DNR order (yellow form or pink MOST form)      No No    Current Medications (verified) Outpatient Encounter Medications as of 10/18/2022  Medication Sig   CALCIUM PO Take 500 mg by mouth 3 (three) times daily.    Cyanocobalamin (B-12 PO) Take 1 tablet by mouth daily.    hydrochlorothiazide (MICROZIDE) 12.5 MG capsule Take 1 capsule (12.5  mg total) by mouth daily.   ketoconazole (NIZORAL) 2 % cream Apply 1 Application topically 2 (two) times daily.   levothyroxine (SYNTHROID) 200 MCG tablet Take 1 tablet (200 mcg total) by mouth See admin instructions. Taking Synthroid daily except on Monday and Fridays pt takes Synthroid BID   lisinopril (ZESTRIL) 10 MG tablet Take 1 tablet (10 mg total) by mouth daily.   metFORMIN (GLUCOPHAGE) 1000 MG tablet Take 1 tablet (1,000 mg total) by mouth daily with breakfast.   Multiple Vitamin (MULTIVITAMIN WITH MINERALS) TABS Take 2 tablets by mouth daily.    pravastatin (PRAVACHOL) 10 MG tablet Take 1 tablet (10 mg total) by mouth daily.   No facility-administered encounter medications on file as of 10/18/2022.    Allergies (verified) Patient has no known allergies.   History: Past Medical History:  Diagnosis Date   Anemia    Arthritis    Diabetes mellitus    Has had for 13 years, type 2   Diabetes mellitus-type II 02/06/2012   GERD (gastroesophageal reflux disease)    Occasional - only 1x q73m   H/O umbilical hernia repair-mesh used; done in [place] 02/06/2012   Hypercholesteremia 02/06/2012   Hyperlipidemia    Hypertension    Hypothyroidism    Lap Roux Y Gastric Bypass Dec 2013 06/09/2012   Morbid obesity (HCC)    Morbid obesity with BMI of 55.8 06/05/2012   OSA on CPAP 02/06/2012   Prostate cancer (HCC)    radioactive  seeds   Sleep apnea    Past Surgical History:  Procedure Laterality Date   BREATH TEK H PYLORI  04/22/2012   Procedure: BREATH TEK H PYLORI;  Surgeon: Valarie Merino, MD;  Location: [facility];  Service: General;  Laterality: N/A;   GASTRIC ROUX-EN-Y  06/09/2012   Procedure: LAPAROSCOPIC ROUX-EN-Y GASTRIC;  Surgeon: Valarie Merino, MD;  Location: [facility];  Service: General;  Laterality: N/A;   HERNIA REPAIR  1992   umbilical   PROSTATE BIOPSY     RADIOACTIVE SEED IMPLANT N/A 12/09/2018   Procedure: RADIOACTIVE SEED IMPLANT/BRACHYTHERAPY  IMPLANT;  Surgeon: Bjorn Pippin, MD;  Location: [facility];  Service: Urology;  Laterality: N/A;   right knee arthroscopy  1999   SPACE OAR INSTILLATION N/A 12/09/2018   Procedure: SPACE OAR INSTILLATION;  Surgeon: Bjorn Pippin, MD;  Location: [facility];  Service: Urology;  Laterality: N/A;   VENTRAL HERNIA REPAIR  06/09/2012   Procedure: LAPAROSCOPIC VENTRAL HERNIA;  Surgeon: Valarie Merino, MD;  Location: [facility];  Service: General;;  primary repair of ventral hernia   Family History  Problem Relation Age of Onset   Breast cancer Mother    Vision loss Mother    Varicose Veins Mother    Heart disease Father    Diabetes Father    Hypertension Father    Obesity Father    Varicose Veins Father    Diabetes Brother        2 brothers   Hypertension Brother    Obesity Brother    Diabetes Maternal Grandmother    Obesity Brother    Stroke Brother    Prostate cancer Neg Hx    Colon cancer Neg Hx    Pancreatic cancer Neg Hx    Social History   Socioeconomic History   Marital status: Married    Spouse name: Not on file   Number of children: 3   Years of education: Not on file   Highest education level: Not on file  Occupational History    Comment: working full time  Tobacco Use   Smoking status: Never   Smokeless tobacco: Never  Vaping Use   Vaping Use: Never used  Substance and Sexual Activity   Alcohol use: Yes    Alcohol/week: 10.0 standard drinks of alcohol    Types: 2 Glasses of wine, 8 Shots of liquor per week    Comment: 2 shots sev times/wk   Drug use: No   Sexual activity: Not Currently    Birth control/protection: Abstinence  Other Topics Concern   Not on file  Social History Narrative   Had 3 children but 1 passed away.  No grands   Retired Nurse, adult   Social Determinants of Health   Financial Resource Strain: Low Risk  (10/18/2022)   Overall Financial Resource Strain (CARDIA)    Difficulty of Paying Living Expenses: Not hard at all  Food Insecurity:  No Food Insecurity (10/18/2022)   Hunger Vital Sign    Worried About Running Out of Food in the Last Year: Never true    Ran Out of Food in the Last Year: Never true  Transportation Needs: No Transportation Needs (10/18/2022)   PRAPARE - Administrator, Civil Service (Medical): No    Lack of Transportation (Non-Medical): No  Physical Activity: Inactive (10/18/2022)   Exercise Vital Sign    Days of Exercise per Week: 0 days    Minutes of Exercise per Session: 0 min  Stress: No  Stress Concern Present (10/18/2022)   [employer] of Occupational Health - Occupational Stress Questionnaire    Feeling of Stress : Not at all  Social Connections: Moderately Integrated (10/18/2022)   Social Connection and Isolation Panel [NHANES]    Frequency of Communication with Friends and Family: Once a week    Frequency of Social Gatherings with Friends and Family: Once a week    Attends Religious Services: More than 4 times per year    Active Member of [employer] or Organizations: Yes    Attends Engineer, structural: More than 4 times per year    Marital Status: Married    Tobacco Counseling Counseling given: Not Answered   Clinical Intake:  Pre-visit preparation completed: Yes  Pain : No/denies pain        How often do you need to have someone help you when you read instructions, pamphlets, or other written materials from your doctor or pharmacy?: 1 - Never  Diabetic?Nutrition Risk Assessment:  Has the patient had any N/V/D within the last 2 months?  No  Does the patient have any non-healing wounds?  No  Has the patient had any unintentional weight loss or weight gain?  No   Diabetes:  Is the patient diabetic?  Yes  If diabetic, was a CBG obtained today?  No  Did the patient bring in their glucometer from home?  No  How often do you monitor your CBG's? N/a.   Financial Strains and Diabetes Management:  Are you having any financial strains with the device, your supplies  or your medication? No .  Does the patient want to be seen by Chronic Care Management for management of their diabetes?  No  Would the patient like to be referred to a Nutritionist or for Diabetic Management?  No   Diabetic Exams:  Diabetic Eye Exam: Completed per pt 4 months ago  Diabetic Foot Exam: Overdue, Pt has been advised about the importance in completing this exam. Pt is scheduled for diabetic foot exam on next appt .   Interpreter Needed?: No  Information entered by :: Lanier Ensign, LPN   Activities of Daily Living    10/18/2022    8:30 AM  In your present state of health, do you have any difficulty performing the following activities:  Hearing? 0  Vision? 0  Difficulty concentrating or making decisions? 0  Walking or climbing stairs? 1  Comment using rollator  Dressing or bathing? 0  Doing errands, shopping? 0  Preparing Food and eating ? N  Using the Toilet? N  In the past six months, have you accidently leaked urine? Y  Comment at times urgency  Do you have problems with loss of bowel control? N  Managing your Medications? N  Managing your Finances? N  Housekeeping or managing your Housekeeping? Y  Comment wife assist    Patient Care Team: Jeani Sow, MD as PCP - General (Family Medicine) Meriam Sprague, MD as PCP - Cardiology (Cardiology) Himmelrich, Loree Fee, RD (Inactive) as Dietitian (Bariatrics) Felicita Gage, RN Nurse Navigator as Registered Nurse (Medical Oncology)  Indicate any recent Medical Services you may have received from other than [facility] providers in the past year (date may be approximate).     Assessment:   This is a routine wellness examination for Bernard.  Hearing/Vision screen Hearing Screening - Comments:: Pt denies any hearing issues  Vision Screening - Comments:: Pt follows up with [facility] for annual eye exams   Dietary issues  and exercise activities discussed: Current Exercise Habits: The patient does not  participate in regular exercise at present   Goals Addressed             This Visit's Progress    Patient Stated       Lose weight        Depression Screen    10/18/2022    9:37 AM 09/18/2022    1:20 PM  PHQ 2/9 Scores  PHQ - 2 Score 0 0  PHQ- 9 Score 0 0    Fall Risk    10/18/2022    8:30 AM 09/18/2022    1:18 PM  Fall Risk   Falls in the past year? 0 0  Number falls in past yr: 0 0  Injury with Fall? 0 0  Risk for fall due to : Impaired vision;Impaired mobility Other (Comment)  Risk for fall due to: Comment pain with walking uses a rolling walker  Follow up Falls prevention discussed Falls evaluation completed    FALL RISK PREVENTION PERTAINING TO THE HOME:  Any stairs in or around the home? Yes  If so, are there any without handrails? Yes  Home free of loose throw rugs in walkways, pet beds, electrical cords, etc? Yes  Adequate lighting in your home to reduce risk of falls? Yes   ASSISTIVE DEVICES UTILIZED TO PREVENT FALLS:  Life alert? No  Use of a cane, walker or w/c? Yes  Grab bars in the bathroom? Yes  Shower chair or bench in shower? Yes  Elevated toilet seat or a handicapped toilet? yes  TIMED UP AND GO:  Was the test performed? No .   Cognitive Function:        10/18/2022    9:40 AM  6CIT Screen  What Year? 0 points  What month? 0 points  What time? 0 points  Count back from 20 0 points  Months in reverse 0 points  Repeat phrase 0 points  Total Score 0 points    Immunizations Immunization History  Administered Date(s) Administered   COVID-19, mRNA, vaccine(Comirnaty)12 years and older 04/30/2022   PFIZER(Purple Top)SARS-COV-2 Vaccination 08/10/2019, 09/08/2019   RSV,unspecified 03/20/2022   Zoster Recombinat (Shingrix) 07/15/2020    TDAP status: Due, Education has been provided regarding the importance of this vaccine. Advised may receive this vaccine at local pharmacy or Health Dept. Aware to provide a copy of the vaccination record  if obtained from local pharmacy or Health Dept. Verbalized acceptance and understanding.  Flu Vaccine status: Due, Education has been provided regarding the importance of this vaccine. Advised may receive this vaccine at local pharmacy or Health Dept. Aware to provide a copy of the vaccination record if obtained from local pharmacy or Health Dept. Verbalized acceptance and understanding.  Pneumococcal vaccine status: Due, Education has been provided regarding the importance of this vaccine. Advised may receive this vaccine at local pharmacy or Health Dept. Aware to provide a copy of the vaccination record if obtained from local pharmacy or Health Dept. Verbalized acceptance and understanding.  Covid-19 vaccine status: Completed vaccines  Qualifies for Shingles Vaccine? Yes   Zostavax completed Yes   Shingrix Completed?: No.    Education has been provided regarding the importance of this vaccine. Patient has been advised to call insurance company to determine out of pocket expense if they have not yet received this vaccine. Advised may also receive vaccine at local pharmacy or Health Dept. Verbalized acceptance and understanding.  Screening Tests Health Maintenance  Topic  Date Due   FOOT EXAM  Never done   OPHTHALMOLOGY EXAM  Never done   DTaP/Tdap/Td (1 - Tdap) Never done   Zoster Vaccines- Shingrix (2 of 2) 01/21/2023 (Originally 09/09/2020)   Pneumonia Vaccine 72+ Years old (1 of 1 - PCV) 01/22/2023 (Originally 01/27/2018)   COVID-19 Vaccine (4 - 2023-24 season) 01/22/2023 (Originally 06/25/2022)   INFLUENZA VACCINE  01/10/2023   HEMOGLOBIN A1C  03/20/2023   Diabetic kidney evaluation - eGFR measurement  09/18/2023   Diabetic kidney evaluation - Urine ACR  09/18/2023   Medicare Annual Wellness (AWV)  10/18/2023   COLONOSCOPY (Pts 45-54yrs Insurance coverage will need to be confirmed)  10/04/2030   Hepatitis C Screening  Completed   HPV VACCINES  Aged Out    Health Maintenance  Health  Maintenance Due  Topic Date Due   FOOT EXAM  Never done   OPHTHALMOLOGY EXAM  Never done   DTaP/Tdap/Td (1 - Tdap) Never done    Colorectal cancer screening: Type of screening: Colonoscopy. Completed 10/18/22. Repeat every 10 years   Additional Screening:  Hepatitis C Screening:  Completed 09/18/22  Vision Screening: Recommended annual ophthalmology exams for early detection of glaucoma and other disorders of the eye. Is the patient up to date with their annual eye exam?  Yes  Who is the provider or what is the name of the office in which the patient attends annual eye exams? [facility]  If pt is not established with a provider, would they like to be referred to a provider to establish care? No .   Dental Screening: Recommended annual dental exams for proper oral hygiene  Community Resource Referral / Chronic Care Management: CRR required this visit?  No   CCM required this visit?  No      Plan:     I have personally reviewed and noted the following in the patient's chart:   Medical and social history Use of alcohol, tobacco or illicit drugs  Current medications and supplements including opioid prescriptions. Patient is not currently taking opioid prescriptions. Functional ability and status Nutritional status Physical activity Advanced directives List of other physicians Hospitalizations, surgeries, and ER visits in previous 12 months Vitals Screenings to include cognitive, depression, and falls Referrals and appointments  In addition, I have reviewed and discussed with patient certain preventive protocols, quality metrics, and best practice recommendations. A written personalized care plan for preventive services as well as general preventive health recommendations were provided to patient.     Marzella Schlein, LPN   [id number]   Nurse Notes: none

## 2022-10-24 ENCOUNTER — Encounter: Payer: Self-pay | Admitting: Family Medicine

## 2022-10-29 DIAGNOSIS — G4733 Obstructive sleep apnea (adult) (pediatric): Secondary | ICD-10-CM | POA: Diagnosis not present

## 2022-11-09 ENCOUNTER — Encounter (HOSPITAL_COMMUNITY): Payer: Self-pay

## 2022-11-20 ENCOUNTER — Other Ambulatory Visit (INDEPENDENT_AMBULATORY_CARE_PROVIDER_SITE_OTHER): Payer: PPO

## 2022-11-20 DIAGNOSIS — E7800 Pure hypercholesterolemia, unspecified: Secondary | ICD-10-CM | POA: Diagnosis not present

## 2022-11-20 DIAGNOSIS — E039 Hypothyroidism, unspecified: Secondary | ICD-10-CM

## 2022-11-20 LAB — TSH: TSH: 2.58 u[IU]/mL (ref 0.35–5.50)

## 2022-11-20 LAB — LIPID PANEL
Cholesterol: 196 mg/dL (ref 0–200)
HDL: 104.6 mg/dL (ref >39.00)
LDL Cholesterol: 73 mg/dL (ref 0–99)
NonHDL: 91.83
Total CHOL/HDL Ratio: 2
Triglycerides: 96 mg/dL (ref 0.0–149.0)
VLDL: 19.2 mg/dL (ref 0.0–40.0)

## 2022-11-20 NOTE — Progress Notes (Signed)
Great.  Same doses

## 2022-11-28 NOTE — Progress Notes (Signed)
Sent message, via epic in basket, requesting orders in epic from surgeon.  

## 2022-12-03 DIAGNOSIS — M1711 Unilateral primary osteoarthritis, right knee: Secondary | ICD-10-CM | POA: Diagnosis not present

## 2022-12-03 NOTE — Progress Notes (Signed)
Second request for pre op orders in [facility]: Spoke with Sherry Gavin  

## 2022-12-04 NOTE — Patient Instructions (Signed)
SURGICAL WAITING ROOM VISITATION Patients having surgery or a procedure may have no more than 2 support people in the waiting area - these visitors may rotate.    Children under the age of 18 must have an adult with them who is not the patient.  If the patient needs to stay at the hospital during part of their recovery, the visitor guidelines for inpatient rooms apply. Pre-op nurse will coordinate an appropriate time for 1 support person to accompany patient in pre-op.  This support person may not rotate.    Please refer to the [facility] website for the visitor guidelines for Inpatients (after your surgery is over and you are in a regular room).       Your procedure is scheduled on: 12-18-22   Report to [facility] Main Entrance    Report to admitting at 8:00 AM   Call this number if you have problems the morning of surgery [phone]   Do not eat food :After Midnight.   After Midnight you may have the following liquids until 7:30 AM DAY OF SURGERY  Water Non-Citrus Juices (without pulp, NO RED-Apple, White grape, White cranberry) Black Coffee (NO MILK/CREAM OR CREAMERS, sugar ok)  Clear Tea (NO MILK/CREAM OR CREAMERS, sugar ok) regular and decaf                             Plain Jell-O (NO RED)                                           Fruit ices (not with fruit pulp, NO RED)                                     Popsicles (NO RED)                                                               Sports drinks like Gatorade (NO RED)                   The day of surgery:  Drink ONE (1) Pre-Surgery G2 at  7:30 AM the morning of surgery. Drink in one sitting. Do not sip.  This drink was given to you during your hospital  pre-op appointment visit. Nothing else to drink after completing the Pre-Surgery G2.          If you have questions, please contact your surgeon's office.   FOLLOW  ANY ADDITIONAL PRE OP INSTRUCTIONS YOU RECEIVED FROM YOUR SURGEON'S OFFICE!!!      Oral Hygiene is also important to reduce your risk of infection.                                    Remember - BRUSH YOUR TEETH THE MORNING OF SURGERY WITH YOUR REGULAR TOOTHPASTE   Do NOT smoke after Midnight   Take these medicines the morning of surgery:   Levothyroxine  Pravastatin  How to Manage Your Diabetes Before and  After Surgery  Why is it important to control my blood sugar before and after surgery? Improving blood sugar levels before and after surgery helps healing and can limit problems. A way of improving blood sugar control is eating a healthy diet by:  Eating less sugar and carbohydrates  Increasing activity/exercise  Talking with your doctor about reaching your blood sugar goals High blood sugars (greater than 180 mg/dL) can raise your risk of infections and slow your recovery, so you will need to focus on controlling your diabetes during the weeks before surgery. Make sure that the doctor who takes care of your diabetes knows about your planned surgery including the date and location.  How do I manage my blood sugar before surgery? Check your blood sugar at least 4 times a day, starting 2 days before surgery, to make sure that the level is not too high or low. Check your blood sugar the morning of your surgery when you wake up and every 2 hours until you get to the Short Stay unit. If your blood sugar is less than 70 mg/dL, you will need to treat for low blood sugar: Do not take insulin. Treat a low blood sugar (less than 70 mg/dL) with  cup of clear juice (cranberry or apple), 4 glucose tablets, OR glucose gel. Recheck blood sugar in 15 minutes after treatment (to make sure it is greater than 70 mg/dL). If your blood sugar is not greater than 70 mg/dL on recheck, call [phone] for further instructions. Report your blood sugar to the short stay nurse when you get to Short Stay.  If you are admitted to the hospital after surgery: Your blood sugar will be checked  by the staff and you will probably be given insulin after surgery (instead of oral diabetes medicines) to make sure you have good blood sugar levels. The goal for blood sugar control after surgery is 80-180 mg/dL.   WHAT DO I DO ABOUT MY DIABETES MEDICATION?  Do not take oral diabetes medicines (pills) the morning of surgery (do not take Metformin the morning of surgery)  DO NOT TAKE THE FOLLOWING 7 DAYS PRIOR TO SURGERY: Ozempic, Wegovy, Rybelsus (Semaglutide), Byetta (exenatide), Bydureon (exenatide ER), Victoza, Saxenda (liraglutide), or Trulicity (dulaglutide) Mounjaro (Tirzepatide) Adlyxin (Lixisenatide), Polyethylene Glycol Loxenatide.  Reviewed and Endorsed by [facility] Patient Education Committee, August 2015  Bring CPAP mask and tubing day of surgery.                              You may not have any metal on your body including jewelry, and body piercing             Do not wear lotions, powders, cologne, or deodorant              Men may shave face and neck.   Do not bring valuables to the hospital. [facility] IS NOT RESPONSIBLE   FOR VALUABLES.   Contacts, dentures or bridgework may not be worn into surgery.   DO NOT BRING YOUR HOME MEDICATIONS TO THE HOSPITAL. PHARMACY WILL DISPENSE MEDICATIONS LISTED ON YOUR MEDICATION LIST TO YOU DURING YOUR ADMISSION IN THE HOSPITAL!    Patients discharged on the day of surgery will not be allowed to drive home.  Someone NEEDS to stay with you for the first 24 hours after anesthesia.   Special Instructions: Bring a copy of your healthcare power of attorney and living will documents the  day of surgery if you haven't scanned them before.              Please read over the following fact sheets you were given: IF YOU HAVE QUESTIONS ABOUT YOUR PRE-OP INSTRUCTIONS PLEASE CALL [phone] Rosey [facility]   If you received a COVID test during your pre-op visit  it is requested that you wear a mask when out in public, stay away from anyone that may  not be feeling well and notify your surgeon if you develop symptoms. If you test positive for Covid or have been in contact with anyone that has tested positive in the last 10 days please notify you surgeon.    Pre-operative 5 CHG Bath Instructions   You can play a key role in reducing the risk of infection after surgery. Your skin needs to be as free of germs as possible. You can reduce the number of germs on your skin by washing with CHG (chlorhexidine gluconate) soap before surgery. CHG is an antiseptic soap that kills germs and continues to kill germs even after washing.   DO NOT use if you have an allergy to chlorhexidine/CHG or antibacterial soaps. If your skin becomes reddened or irritated, stop using the CHG and notify one of our RNs at  [phone] .   Please shower with the CHG soap starting 4 days before surgery using the following schedule:     Please keep in mind the following:  DO NOT shave, including legs and underarms, starting the day of your first shower.   You may shave your face at any point before/day of surgery.  Place clean sheets on your bed the day you start using CHG soap. Use a clean washcloth (not used since being washed) for each shower. DO NOT sleep with pets once you start using the CHG.   CHG Shower Instructions:  If you choose to wash your hair and private area, wash first with your normal shampoo/soap.  After you use shampoo/soap, rinse your hair and body thoroughly to remove shampoo/soap residue.  Turn the water OFF and apply about 3 tablespoons (45 ml) of CHG soap to a CLEAN washcloth.  Apply CHG soap ONLY FROM YOUR NECK DOWN TO YOUR TOES (washing for 3-5 minutes)  DO NOT use CHG soap on face, private areas, open wounds, or sores.  Pay special attention to the area where your surgery is being performed.  If you are having back surgery, having someone wash your back for you may be helpful. Wait 2 minutes after CHG soap is applied, then you may rinse  off the CHG soap.  Pat dry with a clean towel  Put on clean clothes/pajamas   If you choose to wear lotion, please use ONLY the CHG-compatible lotions on the back of this paper.     Additional instructions for the day of surgery: DO NOT APPLY any lotions, deodorants, cologne, or perfumes.   Put on clean/comfortable clothes.  Brush your teeth.  Ask your nurse before applying any prescription medications to the skin.      CHG Compatible Lotions   Aveeno Moisturizing lotion  Cetaphil Moisturizing Cream  Cetaphil Moisturizing Lotion  Clairol Herbal Essence Moisturizing Lotion, Dry Skin  Clairol Herbal Essence Moisturizing Lotion, Extra Dry Skin  Clairol Herbal Essence Moisturizing Lotion, Normal Skin  Curel Age Defying Therapeutic Moisturizing Lotion with Alpha Hydroxy  Curel Extreme Care Body Lotion  Curel Soothing Hands Moisturizing Hand Lotion  Curel Therapeutic Moisturizing Cream, Fragrance-Free  Curel Therapeutic Moisturizing Lotion, Fragrance-Free  Curel Therapeutic Moisturizing Lotion, Original Formula  Eucerin Daily Replenishing Lotion  Eucerin Dry Skin Therapy Plus Alpha Hydroxy Crme  Eucerin Dry Skin Therapy Plus Alpha Hydroxy Lotion  Eucerin Original Crme  Eucerin Original Lotion  Eucerin Plus Crme Eucerin Plus Lotion  Eucerin TriLipid Replenishing Lotion  Keri Anti-Bacterial Hand Lotion  Keri Deep Conditioning Original Lotion Dry Skin Formula Softly Scented  Keri Deep Conditioning Original Lotion, Fragrance Free Sensitive Skin Formula  Keri Lotion Fast Absorbing Fragrance Free Sensitive Skin Formula  Keri Lotion Fast Absorbing Softly Scented Dry Skin Formula  Keri Original Lotion  Keri Skin Renewal Lotion Keri Silky Smooth Lotion  Keri Silky Smooth Sensitive Skin Lotion  Nivea Body Creamy Conditioning Oil  Nivea Body Extra Enriched Lotion  Nivea Body Original Lotion  Nivea Body Sheer Moisturizing Lotion Nivea Crme  Nivea Skin Firming Lotion  NutraDerm 30  Skin Lotion  NutraDerm Skin Lotion  NutraDerm Therapeutic Skin Cream  NutraDerm Therapeutic Skin Lotion  ProShield Protective Hand Cream  Provon moisturizing lotion   PATIENT SIGNATURE_________________________________  NURSE SIGNATURE__________________________________  ________________________________________________________________________    Rogelia Mire  An incentive spirometer is a tool that can help keep your lungs clear and active. This tool measures how well you are filling your lungs with each breath. Taking long deep breaths may help reverse or decrease the chance of developing breathing (pulmonary) problems (especially infection) following: A long period of time when you are unable to move or be active. BEFORE THE PROCEDURE  If the spirometer includes an indicator to show your best effort, your nurse or respiratory therapist will set it to a desired goal. If possible, sit up straight or lean slightly forward. Try not to slouch. Hold the incentive spirometer in an upright position. INSTRUCTIONS FOR USE  Sit on the edge of your bed if possible, or sit up as far as you can in bed or on a chair. Hold the incentive spirometer in an upright position. Breathe out normally. Place the mouthpiece in your mouth and seal your lips tightly around it. Breathe in slowly and as deeply as possible, raising the piston or the ball toward the top of the column. Hold your breath for 3-5 seconds or for as long as possible. Allow the piston or ball to fall to the bottom of the column. Remove the mouthpiece from your mouth and breathe out normally. Rest for a few seconds and repeat Steps 1 through 7 at least 10 times every 1-2 hours when you are awake. Take your time and take a few normal breaths between deep breaths. The spirometer may include an indicator to show your best effort. Use the indicator as a goal to work toward during each repetition. After each set of 10 deep breaths,  practice coughing to be sure your lungs are clear. If you have an incision (the cut made at the time of surgery), support your incision when coughing by placing a pillow or rolled up towels firmly against it. Once you are able to get out of bed, walk around indoors and cough well. You may stop using the incentive spirometer when instructed by your caregiver.  RISKS AND COMPLICATIONS Take your time so you do not get dizzy or light-headed. If you are in pain, you may need to take or ask for pain medication before doing incentive spirometry. It is harder to take a deep breath if you are having pain. AFTER USE Rest and breathe slowly and easily. It can be helpful to keep track of a log  of your progress. Your caregiver can provide you with a simple table to help with this. If you are using the spirometer at home, follow these instructions: [place] IF:  You are having difficultly using the spirometer. You have trouble using the spirometer as often as instructed. Your pain medication is not giving enough relief while using the spirometer. You develop fever of 100.5 F (38.1 C) or higher. SEEK IMMEDIATE MEDICAL CARE IF:  You cough up bloody sputum that had not been present before. You develop fever of 102 F (38.9 C) or greater. You develop worsening pain at or near the incision site. MAKE SURE YOU:  Understand these instructions. Will watch your condition. Will get help right away if you are not doing well or get worse. Document Released: 10/08/2006 Document Revised: 08/20/2011 Document Reviewed: 12/09/2006 [facility] Patient Information 2014 [place], [place].   ________________________________________________________________________

## 2022-12-05 ENCOUNTER — Encounter (HOSPITAL_COMMUNITY)
Admission: RE | Admit: 2022-12-05 | Discharge: 2022-12-05 | Disposition: A | Discharge status: Home / Self Care | Payer: PPO | Source: Ambulatory Visit | Attending: Orthopedic Surgery | Admitting: Orthopedic Surgery

## 2022-12-05 ENCOUNTER — Other Ambulatory Visit: Payer: Self-pay

## 2022-12-05 ENCOUNTER — Encounter (HOSPITAL_COMMUNITY): Payer: Self-pay

## 2022-12-05 VITALS — BP 146/88 | HR 81 | Temp 98.4°F | Resp 20 | Ht 71.0 in | Wt 282.0 lb

## 2022-12-05 DIAGNOSIS — I351 Nonrheumatic aortic (valve) insufficiency: Secondary | ICD-10-CM | POA: Diagnosis not present

## 2022-12-05 DIAGNOSIS — Z9884 Bariatric surgery status: Secondary | ICD-10-CM | POA: Diagnosis not present

## 2022-12-05 DIAGNOSIS — E119 Type 2 diabetes mellitus without complications: Secondary | ICD-10-CM | POA: Insufficient documentation

## 2022-12-05 DIAGNOSIS — I10 Essential (primary) hypertension: Secondary | ICD-10-CM | POA: Insufficient documentation

## 2022-12-05 DIAGNOSIS — Z7984 Long term (current) use of oral hypoglycemic drugs: Secondary | ICD-10-CM | POA: Diagnosis not present

## 2022-12-05 DIAGNOSIS — M1711 Unilateral primary osteoarthritis, right knee: Secondary | ICD-10-CM | POA: Diagnosis not present

## 2022-12-05 DIAGNOSIS — K219 Gastro-esophageal reflux disease without esophagitis: Secondary | ICD-10-CM | POA: Diagnosis not present

## 2022-12-05 DIAGNOSIS — Z01812 Encounter for preprocedural laboratory examination: Secondary | ICD-10-CM | POA: Insufficient documentation

## 2022-12-05 DIAGNOSIS — G4730 Sleep apnea, unspecified: Secondary | ICD-10-CM | POA: Insufficient documentation

## 2022-12-05 DIAGNOSIS — Z8546 Personal history of malignant neoplasm of prostate: Secondary | ICD-10-CM | POA: Insufficient documentation

## 2022-12-05 DIAGNOSIS — Z01818 Encounter for other preprocedural examination: Secondary | ICD-10-CM

## 2022-12-05 LAB — BASIC METABOLIC PANEL
Anion gap: 9 (ref 5–15)
BUN: 18 mg/dL (ref 8–23)
CO2: 22 mmol/L (ref 22–32)
Calcium: 9 mg/dL (ref 8.9–10.3)
Chloride: 104 mmol/L (ref 98–111)
Creatinine, Ser: 1.32 mg/dL — ABNORMAL HIGH (ref 0.61–1.24)
GFR, Estimated: 58 mL/min — ABNORMAL LOW (ref >60)
Glucose, Bld: 108 mg/dL — ABNORMAL HIGH (ref 70–99)
Potassium: 4.3 mmol/L (ref 3.5–5.1)
Sodium: 135 mmol/L (ref 135–145)

## 2022-12-05 LAB — CBC
HCT: 41.3 % (ref 39.0–52.0)
Hemoglobin: 13.1 g/dL (ref 13.0–17.0)
MCH: 29.8 pg (ref 26.0–34.0)
MCHC: 31.7 g/dL (ref 30.0–36.0)
MCV: 93.9 fL (ref 80.0–100.0)
Platelets: 181 10*3/uL (ref 150–400)
RBC: 4.4 MIL/uL (ref 4.22–5.81)
RDW: 14.8 % (ref 11.5–15.5)
WBC: 6.2 10*3/uL (ref 4.0–10.5)
nRBC: 0 % (ref 0.0–0.2)

## 2022-12-05 LAB — SURGICAL PCR SCREEN
MRSA, PCR: NEGATIVE
Staphylococcus aureus: NEGATIVE

## 2022-12-05 LAB — GLUCOSE, CAPILLARY: Glucose-Capillary: 112 mg/dL — ABNORMAL HIGH (ref 70–99)

## 2022-12-05 NOTE — Progress Notes (Addendum)
COVID Vaccine received:  []  No [x]  Yes Date of any COVID positive Test in last 90 days: No PCP - Jeani Sow MD Cardiologist - Laurance Flatten MD  Chest x-ray -  EKG -  09/13/22  EPIC Stress Test -  ECHO - 10/11/22  EPIC Cardiac Cath - No  Medical clearance Dr. Lutricia Horsfall 09/20/22  Bowel Prep - [x]  No  []   Yes ______  Pacemaker / ICD device [x]  No []  Yes   Spinal Cord Stimulator:[x]  No []  Yes       History of Sleep Apnea? []  No [x]  Yes   CPAP used?- []  No [x]  Yes    Does the patient monitor blood sugar?          []  No []  Yes  []  N/A  Patient has: []  NO Hx DM   []  Pre-DM                 []  DM1  [x]   DM2 Does patient have a [employer] or Dexacom? [x]  No []  Yes   Fasting Blood Sugar Ranges-  Checks Blood Sugar ___0__ times a day  GLP1 agonist / usual dose - No GLP1 instructions:  SGLT-2 inhibitors / usual dose - No SGLT-2 instructions:   Blood Thinner / Instructions:No Aspirin Instructions:No  Comments:   Activity level: Patient is  unable to climb a flight of stairs without difficulty; [x]  No CP  [x]  No SOB, but would have _Knee pain__   Patient can perform ADLs without assistance.   Anesthesia review: Aortic Stenosis, HTN, DM2, OSA  Patient denies shortness of breath, fever, cough and chest pain at PAT appointment.  Patient verbalized understanding and agreement to the Pre-Surgical Instructions that were given to them at this PAT appointment. Patient was also educated of the need to review these PAT instructions again prior to his/her surgery.I reviewed the appropriate phone numbers to call if they have any and questions or concerns.

## 2022-12-07 LAB — HEMOGLOBIN A1C
Hgb A1c MFr Bld: 5.5 % (ref 4.8–5.6)
Mean Plasma Glucose: 111 mg/dL

## 2022-12-10 NOTE — Anesthesia Preprocedure Evaluation (Addendum)
Anesthesia Evaluation  Patient identified by MRN, date of birth, ID band Patient awake    Reviewed: Allergy & Precautions, NPO status , Patient's Chart, lab work & pertinent test results  History of Anesthesia Complications Negative for: history of anesthetic complications  Airway Mallampati: III  TM Distance: >3 FB Neck ROM: Full   Comment: Previous glidescope intubation Dental  (+) Dental Advisory Given   Pulmonary neg shortness of breath, sleep apnea and Continuous Positive Airway Pressure Ventilation , neg COPD, neg recent URI   Pulmonary exam normal breath sounds clear to auscultation       Cardiovascular hypertension (HCTZ, lisinopril), Pt. on medications (-) angina (-) Past MI, (-) Cardiac Stents and (-) CABG (-) dysrhythmias + Valvular Problems/Murmurs (mild AI and MR, moderate AS) AS, AI and MR  Rhythm:Regular Rate:Normal  HLD  TTE 10/11/2022: IMPRESSIONS     1. Aortic stenosis remains moderate. Vmax 3.8 m/s, MG 33 mmHG AVA 1.07  cm2. Gradients are higher than prior study. The aortic valve is tricuspid.  There is severe calcifcation of the aortic valve. There is severe  thickening of the aortic valve. Aortic  valve regurgitation is mild. Moderate aortic valve stenosis. Aortic valve  area, by VTI measures 1.07 cm. Aortic valve mean gradient measures 33.0  mmHg. Aortic valve Vmax measures 3.84 m/s.   2. Left ventricular ejection fraction, by estimation, is 60 to 65%. The  left ventricle has normal function. The left ventricle has no regional  wall motion abnormalities. Left ventricular diastolic parameters are  consistent with Grade I diastolic  dysfunction (impaired relaxation).   3. Right ventricular systolic function is normal. The right ventricular  size is normal. Tricuspid regurgitation signal is inadequate for assessing  PA pressure.   4. The mitral valve is grossly normal. Mild mitral valve regurgitation.  No  evidence of mitral stenosis.   5. The inferior vena cava is normal in size with greater than 50%  respiratory variability, suggesting right atrial pressure of 3 mmHg.   Normal stress test 09/25/2022    Neuro/Psych negative neurological ROS     GI/Hepatic Neg liver ROS,GERD  ,,S/p roux-en-Y gastric bypass in 2013   Endo/Other  diabetes (Hgb A1c 5.5), Well Controlled, Type 2, Oral Hypoglycemic AgentsHypothyroidism    Renal/GU negative Renal ROS   H/o prostate cancer s/p radioactive seeds 2020    Musculoskeletal  (+) Arthritis , Osteoarthritis,    Abdominal  (+) + obese  Peds  Hematology  (+) Blood dyscrasia, anemia   Anesthesia Other Findings Platelets 181 on 6/26  Reproductive/Obstetrics                             Anesthesia Physical Anesthesia Plan  ASA: 3  Anesthesia Plan: General   Post-op Pain Management: Tylenol PO (pre-op)* and Regional block*   Induction: Intravenous  PONV Risk Score and Plan: 2 and Ondansetron, Dexamethasone and Treatment may vary due to age or medical condition  Airway Management Planned: LMA  Additional Equipment:   Intra-op Plan:   Post-operative Plan: Extubation in OR  Informed Consent: I have reviewed the patients History and Physical, chart, labs and discussed the procedure including the risks, benefits and alternatives for the proposed anesthesia with the patient or authorized representative who has indicated his/her understanding and acceptance.     Dental advisory given  Plan Discussed with: CRNA and Anesthesiologist  Anesthesia Plan Comments: (See PAT note 12/05/2022  Discussed potential risks of nerve  blocks including, but not limited to, infection, bleeding, nerve damage, seizures, pneumothorax, respiratory depression, and potential failure of the block. Alternatives to nerve blocks discussed. All questions answered.  Risks of general anesthesia discussed including, but not limited to, sore  throat, hoarse voice, chipped/damaged teeth, injury to vocal cords, nausea and vomiting, allergic reactions, lung infection, heart attack, stroke, and death. All questions answered. )       Anesthesia Quick Evaluation

## 2022-12-10 NOTE — Progress Notes (Signed)
Anesthesia Chart Review   Case: [id number] Date/Time: 12/18/22 1015   Procedure: TOTAL KNEE ARTHROPLASTY (Right: Knee)   Anesthesia type: Spinal   Pre-op diagnosis: right knee DJD   Location: [facility]OM 07 / [facility]   Surgeons: Teryl Lucy, MD       DISCUSSION:72 y.o. never smoker with h/o HTN, GERD, sleep apnea, DM II, s/p gastric bypass 2013, moderate AS, prostate cancer, right knee djd scheduled for above procedure 12/18/2022 with Dr. Teryl Lucy.   Pt last seen by cardiology 09/13/2022. Per OV note,  "-Patient unable to complete >4METs due to joint pain -Also with moderate AS in 2021 with no recent TTE -Will check TTE and myoview prior to OR -If no high risk findings, can proceed with surgery without further testing"  Low risk stress test 09/25/2022. Per results comments from Dr. Shari Prows, "His stress test is normal. No further testing needed prior to his surgery."  Echo 10/11/22 with moderate aortic stenosis, AVA 1.07 cm2, mean gradient 33.0 mmHg.   Anticipate pt can proceed with planned procedure barring acute status change.   VS: BP (!) 146/88   Pulse 81   Temp 36.9 C (Oral)   Resp 20   Ht 5\' 11"  (1.803 m)   Wt 127.9 kg   SpO2 98%   BMI 39.33 kg/m   PROVIDERS: Jeani Sow, MD is PCP   Cardiologist - Laurance Flatten MD  LABS: Labs reviewed: Acceptable for surgery. (all labs ordered are listed, but only abnormal results are displayed)  Labs Reviewed  BASIC METABOLIC PANEL - Abnormal; Notable for the following components:      Result Value   Glucose, Bld 108 (*)    Creatinine, Ser 1.32 (*)    GFR, Estimated 58 (*)    All other components within normal limits  GLUCOSE, CAPILLARY - Abnormal; Notable for the following components:   Glucose-Capillary 112 (*)    All other components within normal limits  SURGICAL PCR SCREEN  HEMOGLOBIN A1C  CBC     IMAGES:   EKG:   CV: Echo 10/11/22 1. Aortic stenosis remains moderate. Vmax 3.8 m/s, MG 33 mmHG AVA 1.07   cm2. Gradients are higher than prior study. The aortic valve is tricuspid.  There is severe calcifcation of the aortic valve. There is severe  thickening of the aortic valve. Aortic  valve regurgitation is mild. Moderate aortic valve stenosis. Aortic valve  area, by VTI measures 1.07 cm. Aortic valve mean gradient measures 33.0  mmHg. Aortic valve Vmax measures 3.84 m/s.   2. Left ventricular ejection fraction, by estimation, is 60 to 65%. The  left ventricle has normal function. The left ventricle has no regional  wall motion abnormalities. Left ventricular diastolic parameters are  consistent with Grade I diastolic  dysfunction (impaired relaxation).   3. Right ventricular systolic function is normal. The right ventricular  size is normal. Tricuspid regurgitation signal is inadequate for assessing  PA pressure.   4. The mitral valve is grossly normal. Mild mitral valve regurgitation.  No evidence of mitral stenosis.   5. The inferior vena cava is normal in size with greater than 50%  respiratory variability, suggesting right atrial pressure of 3 mmHg.   Myocardial Perfusion 09/25/2022   The study is normal. The study is low risk.   No ST deviation was noted.   LV perfusion is normal. There is no evidence of ischemia. There is no evidence of infarction.   Left ventricular function is normal. Nuclear stress  EF: 54 %. The left ventricular ejection fraction is mildly decreased (45-54%). End diastolic cavity size is normal. End systolic cavity size is normal.   Prior study available for comparison from 08/26/2018.   Normal stress nuclear study with inferior thinning but no ischemia or infarction; gated EF 54 with normal wall motion.  Past Medical History:  Diagnosis Date   Anemia    Arthritis    Diabetes mellitus    Has had for 13 years, type 2   Diabetes mellitus-type II 02/06/2012   GERD (gastroesophageal reflux disease)    Occasional - only 1x q69m   H/O umbilical hernia  repair-mesh used; done in [place] 02/06/2012   Hypercholesteremia 02/06/2012   Hyperlipidemia    Hypertension    Hypothyroidism    Lap Roux Y Gastric Bypass Dec 2013 06/09/2012   Morbid obesity (HCC)    Morbid obesity with BMI of 55.8 06/05/2012   OSA on CPAP 02/06/2012   Prostate cancer (HCC)    radioactive seeds   Sleep apnea     Past Surgical History:  Procedure Laterality Date   BREATH TEK H PYLORI  04/22/2012   Procedure: BREATH TEK H PYLORI;  Surgeon: Valarie Merino, MD;  Location: [facility];  Service: General;  Laterality: N/A;   GASTRIC ROUX-EN-Y  06/09/2012   Procedure: LAPAROSCOPIC ROUX-EN-Y GASTRIC;  Surgeon: Valarie Merino, MD;  [facility];  Service: General;  Laterality: N/A;   HERNIA REPAIR  1992   umbilical   PROSTATE BIOPSY     RADIOACTIVE SEED IMPLANT N/A 12/09/2018   Procedure: RADIOACTIVE SEED IMPLANT/BRACHYTHERAPY IMPLANT;  Surgeon: Bjorn Pippin, MD;  [facility];  Service: Urology;  Laterality: N/A;   right knee arthroscopy  1999   SPACE OAR INSTILLATION N/A 12/09/2018   Procedure: SPACE OAR INSTILLATION;  Surgeon: Bjorn Pippin, MD;  [facility];  Service: Urology;  Laterality: N/A;   VENTRAL HERNIA REPAIR  06/09/2012   Procedure: LAPAROSCOPIC VENTRAL HERNIA;  Surgeon: Valarie Merino, MD;  [facility];  Service: General;;  primary repair of ventral hernia    MEDICATIONS:  CALCIUM PO   Cyanocobalamin (B-12 PO)   hydrochlorothiazide (MICROZIDE) 12.5 MG capsule   ketoconazole (NIZORAL) 2 % cream   levothyroxine (SYNTHROID) 200 MCG tablet   lisinopril (ZESTRIL) 10 MG tablet   metFORMIN (GLUCOPHAGE) 1000 MG tablet   Multiple Vitamin (MULTIVITAMIN WITH MINERALS) TABS   pravastatin (PRAVACHOL) 10 MG tablet   No current facility-administered medications for this encounter.    Jodell Cipro Ward, PA-C [facility] [phone]

## 2022-12-11 ENCOUNTER — Other Ambulatory Visit: Payer: Self-pay | Admitting: Family Medicine

## 2022-12-12 NOTE — Care Plan (Signed)
Ortho Bundle Case Management Note  Patient Details  Name: Dillon Jones MRN: [id number] Date of Birth: Oct 29, 1950  patient and wife seen in the office for H&P, will discharge to home with family to assist. rolling walker ordered for home. HHPT referral to [facility] care and OPPT set up with [facility]. discharge instructions discussed and questions answered. appointments confirmed. Patient and MD in agreement with plan. Choice offered                    DME Arranged:  Walker rolling DME Agency:  [facility]  HH Arranged:  PT HH Agency:  [facility]  Additional Comments: Please contact me with any questions of if this plan should need to change.  Shauna Hugh,  RN,BSN,MHA,CCM  [facility] Orthopaedic Specialist  [phone] 12/12/2022, 1:06 PM

## 2022-12-17 NOTE — H&P (Signed)
KNEE ARTHROPLASTY ADMISSION H&P  Patient ID: Dillon Jones MRN: [id number] DOB/AGE: November 15, 1950 72 y.o.  Chief Complaint: right knee pain.  Planned Procedure Date: 12/18/22 Medical Clearance by Dr. Lutricia Horsfall Cardiac Clearance by Dr. Shari Prows  HPI: Dillon Jones is a 72 y.o. male who presents for evaluation of right knee DJD. The patient has a history of pain and functional disability in the right knee due to arthritis and has failed non-surgical conservative treatments for greater than 12 weeks to include NSAID's and/or analgesics, corticosteriod injections, viscosupplementation injections, and activity modification.  Onset of symptoms was gradual, starting 1 years ago with rapidlly worsening course since that time. The patient noted prior procedures on the knee to include  arthroscopy on the right knee.  Patient currently rates pain at 7 out of 10 with activity. Patient has worsening of pain with activity and weight bearing and pain that interferes with activities of daily living.  Patient has evidence of joint space narrowing by imaging studies.  There is no active infection.  Past Medical History:  Diagnosis Date   Anemia    Arthritis    Diabetes mellitus    Has had for 13 years, type 2   Diabetes mellitus-type II 02/06/2012   GERD (gastroesophageal reflux disease)    Occasional - only 1x q82m   H/O umbilical hernia repair-mesh used; done in [place] 02/06/2012   Hypercholesteremia 02/06/2012   Hyperlipidemia    Hypertension    Hypothyroidism    Lap Roux Y Gastric Bypass Dec 2013 06/09/2012   Morbid obesity (HCC)    Morbid obesity with BMI of 55.8 06/05/2012   OSA on CPAP 02/06/2012   Prostate cancer (HCC)    radioactive seeds   Sleep apnea    Past Surgical History:  Procedure Laterality Date   BREATH TEK H PYLORI  04/22/2012   Procedure: BREATH TEK H PYLORI;  Surgeon: Valarie Merino, MD;  Location: [facility];  Service: General;  Laterality: N/A;   GASTRIC ROUX-EN-Y   06/09/2012   Procedure: LAPAROSCOPIC ROUX-EN-Y GASTRIC;  Surgeon: Valarie Merino, MD;  [facility];  Service: General;  Laterality: N/A;   HERNIA REPAIR  1992   umbilical   PROSTATE BIOPSY     RADIOACTIVE SEED IMPLANT N/A 12/09/2018   Procedure: RADIOACTIVE SEED IMPLANT/BRACHYTHERAPY IMPLANT;  Surgeon: Bjorn Pippin, MD;  [facility];  Service: Urology;  Laterality: N/A;   right knee arthroscopy  1999   SPACE OAR INSTILLATION N/A 12/09/2018   Procedure: SPACE OAR INSTILLATION;  Surgeon: Bjorn Pippin, MD;  [facility];  Service: Urology;  Laterality: N/A;   VENTRAL HERNIA REPAIR  06/09/2012   Procedure: LAPAROSCOPIC VENTRAL HERNIA;  Surgeon: Valarie Merino, MD;  [facility];  Service: General;;  primary repair of ventral hernia   No Known Allergies Prior to Admission medications   Medication Sig Start Date End Date Taking? Authorizing Provider  CALCIUM PO Take 500 mg by mouth 3 (three) times daily.    Yes [provider]  Cyanocobalamin (B-12 PO) Take 1 tablet by mouth daily.    Yes [provider]  hydrochlorothiazide (MICROZIDE) 12.5 MG capsule Take 1 capsule (12.5 mg total) by mouth daily. 09/18/22  Yes Jeani Sow, MD  ketoconazole (NIZORAL) 2 % cream Apply 1 Application topically 2 (two) times daily. Patient taking differently: Apply 1 Application topically daily as needed for irritation. 09/27/22  Yes Jeani Sow, MD  levothyroxine (SYNTHROID) 200 MCG tablet Take 1 tablet (200  mcg total) by mouth See admin instructions. Taking Synthroid daily except on Monday and Fridays pt takes Synthroid BID 09/18/22  Yes Jeani Sow, MD  lisinopril (ZESTRIL) 10 MG tablet Take 1 tablet (10 mg total) by mouth daily. 09/18/22  Yes Jeani Sow, MD  metFORMIN (GLUCOPHAGE) 1000 MG tablet Take 1 tablet (1,000 mg total) by mouth daily with breakfast. 09/18/22  Yes Jeani Sow, MD  Multiple Vitamin (MULTIVITAMIN WITH MINERALS) TABS Take 2  tablets by mouth daily.    Yes [provider]  pravastatin (PRAVACHOL) 10 MG tablet Take 1 tablet (10 mg total) by mouth daily. 09/18/22  Yes Jeani Sow, MD   Social History   Socioeconomic History   Marital status: Married    Spouse name: Not on file   Number of children: 3   Years of education: Not on file   Highest education level: Not on file  Occupational History    Comment: working full time  Tobacco Use   Smoking status: Never   Smokeless tobacco: Never  Vaping Use   Vaping Use: Never used  Substance and Sexual Activity   Alcohol use: Yes    Alcohol/week: 10.0 standard drinks of alcohol    Types: 2 Glasses of wine, 8 Shots of liquor per week    Comment: 2 shots sev times/wk   Drug use: No   Sexual activity: Not Currently    Birth control/protection: Abstinence  Other Topics Concern   Not on file  Social History Narrative   Had 3 children but 1 passed away.  No grands   Retired Nurse, adult   Social Determinants of Health   Financial Resource Strain: Low Risk  (10/18/2022)   Overall Financial Resource Strain (CARDIA)    Difficulty of Paying Living Expenses: Not hard at all  Food Insecurity: No Food Insecurity (10/18/2022)   Hunger Vital Sign    Worried About Running Out of Food in the Last Year: Never true    Ran Out of Food in the Last Year: Never true  Transportation Needs: No Transportation Needs (10/18/2022)   PRAPARE - Administrator, Civil Service (Medical): No    Lack of Transportation (Non-Medical): No  Physical Activity: Inactive (10/18/2022)   Exercise Vital Sign    Days of Exercise per Week: 0 days    Minutes of Exercise per Session: 0 min  Stress: No Stress Concern Present (10/18/2022)   [employer] of Occupational Health - Occupational Stress Questionnaire    Feeling of Stress : Not at all  Social Connections: Moderately Integrated (10/18/2022)   Social Connection and Isolation Panel [NHANES]    Frequency of  Communication with Friends and Family: Once a week    Frequency of Social Gatherings with Friends and Family: Once a week    Attends Religious Services: More than 4 times per year    Active Member of [employer] or Organizations: Yes    Attends Engineer, structural: More than 4 times per year    Marital Status: Married   Family History  Problem Relation Age of Onset   Breast cancer Mother    Vision loss Mother    Varicose Veins Mother    Heart disease Father    Diabetes Father    Hypertension Father    Obesity Father    Varicose Veins Father    Diabetes Brother        2 brothers   Hypertension Brother    Obesity  Brother    Diabetes Maternal Grandmother    Obesity Brother    Stroke Brother    Prostate cancer Neg Hx    Colon cancer Neg Hx    Pancreatic cancer Neg Hx     ROS: Currently denies lightheadedness, dizziness, Fever, chills, CP, SOB.   No personal history of DVT, PE, MI, or CVA. No loose teeth or dentures All other systems have been reviewed and were otherwise currently negative with the exception of those mentioned in the HPI and as above.  Objective: Vitals: HT: 5'11"  WT: 281 BMI 39.2  T: 98.3  BP 150/81  P: 600  O2 SAT: 96% on room air.  Physical Exam: General: Alert, NAD.  Antalgic Gait  HEENT: EOMI, Good Neck Extension  Pulm: No increased work of breathing.  Clear B/L A/P w/o crackle or wheeze.  CV: RRR, No m/g/r appreciated  GI: soft, NT, ND Neuro: Neuro without gross focal deficit.  Sensation intact distally Skin: No lesions in the area of chief complaint MSK/Surgical Site: He has 5-120 degrees of range of motion of the right knee. Positive crepitus.  No significant medial or lateral joint line tenderness. EHL and FHL intact. There is a 2+  physical therapy pulse. Sensation increased to light touch due to a history of peripheral neuropathy.     Imaging Review Plain radiographs demonstrate severe degenerative joint disease of the right knee.   The  overall alignment is varus. The bone quality appears to be adequate for age and reported activity level.  Preoperative templating of the joint replacement has been completed, documented, and submitted to the Operating Room personnel in order to optimize intra-operative equipment management.  Assessment: right knee DJD   Plan: Plan for Procedure(s): TOTAL KNEE ARTHROPLASTY  The patient history, physical exam, clinical judgement of the provider and imaging are consistent with end stage degenerative joint disease and total joint arthroplasty is deemed medically necessary. The treatment options including medical management, injection therapy, and arthroplasty were discussed at length. The risks and benefits of Procedure(s): TOTAL KNEE ARTHROPLASTY were presented and reviewed.  The risks of nonoperative treatment, versus surgical intervention including but not limited to continued pain, aseptic loosening, stiffness, dislocation/subluxation, infection, bleeding, nerve injury, blood clots, cardiopulmonary complications, morbidity, mortality, among others were discussed. The patient verbalizes understanding and wishes to proceed with the plan.  Patient is being admitted for inpatient treatment for surgery, pain control, PT, prophylactic antibiotics, VTE prophylaxis, progressive ambulation, ADL's and discharge planning.    The patient does meet the criteria for TXA which will be used perioperatively.   ASA 325 mg will be used postoperatively for DVT prophylaxis in addition to SCDs, and early ambulation.    Armida Sans, PA-C 12/17/2022 1:53 PM

## 2022-12-18 ENCOUNTER — Other Ambulatory Visit: Payer: Self-pay | Admitting: Family Medicine

## 2022-12-18 ENCOUNTER — Observation Stay (HOSPITAL_COMMUNITY)
Admission: RE | Admit: 2022-12-18 | Discharge: 2022-12-20 | Disposition: A | Discharge status: Home Health | Payer: PPO | Source: Ambulatory Visit | Attending: Orthopedic Surgery | Admitting: Orthopedic Surgery

## 2022-12-18 ENCOUNTER — Ambulatory Visit (HOSPITAL_BASED_OUTPATIENT_CLINIC_OR_DEPARTMENT_OTHER): Payer: PPO | Admitting: Anesthesiology

## 2022-12-18 ENCOUNTER — Encounter (HOSPITAL_COMMUNITY): Payer: Self-pay | Admitting: Orthopedic Surgery

## 2022-12-18 ENCOUNTER — Ambulatory Visit (HOSPITAL_COMMUNITY): Payer: PPO | Admitting: Physician Assistant

## 2022-12-18 ENCOUNTER — Observation Stay (HOSPITAL_COMMUNITY): Payer: PPO

## 2022-12-18 ENCOUNTER — Other Ambulatory Visit: Payer: Self-pay

## 2022-12-18 ENCOUNTER — Encounter (HOSPITAL_COMMUNITY)
Admission: RE | Disposition: A | Discharge status: Home Health | Payer: Self-pay | Source: Ambulatory Visit | Attending: Orthopedic Surgery

## 2022-12-18 DIAGNOSIS — E119 Type 2 diabetes mellitus without complications: Secondary | ICD-10-CM | POA: Diagnosis not present

## 2022-12-18 DIAGNOSIS — M1711 Unilateral primary osteoarthritis, right knee: Secondary | ICD-10-CM

## 2022-12-18 DIAGNOSIS — I10 Essential (primary) hypertension: Secondary | ICD-10-CM

## 2022-12-18 DIAGNOSIS — Z8546 Personal history of malignant neoplasm of prostate: Secondary | ICD-10-CM | POA: Insufficient documentation

## 2022-12-18 DIAGNOSIS — Z79899 Other long term (current) drug therapy: Secondary | ICD-10-CM | POA: Diagnosis not present

## 2022-12-18 DIAGNOSIS — E039 Hypothyroidism, unspecified: Secondary | ICD-10-CM | POA: Insufficient documentation

## 2022-12-18 DIAGNOSIS — Z96651 Presence of right artificial knee joint: Secondary | ICD-10-CM | POA: Diagnosis not present

## 2022-12-18 DIAGNOSIS — Z7984 Long term (current) use of oral hypoglycemic drugs: Secondary | ICD-10-CM | POA: Diagnosis not present

## 2022-12-18 DIAGNOSIS — R609 Edema, unspecified: Secondary | ICD-10-CM | POA: Diagnosis not present

## 2022-12-18 DIAGNOSIS — G8918 Other acute postprocedural pain: Secondary | ICD-10-CM | POA: Diagnosis not present

## 2022-12-18 DIAGNOSIS — Z01818 Encounter for other preprocedural examination: Principal | ICD-10-CM

## 2022-12-18 HISTORY — PX: TOTAL KNEE ARTHROPLASTY: SHX125

## 2022-12-18 LAB — GLUCOSE, CAPILLARY
Glucose-Capillary: 97 mg/dL (ref 70–99)
Glucose-Capillary: 142 mg/dL — ABNORMAL HIGH (ref 70–99)
Glucose-Capillary: 171 mg/dL — ABNORMAL HIGH (ref 70–99)
Glucose-Capillary: 117 mg/dL — ABNORMAL HIGH (ref 70–99)

## 2022-12-18 SURGERY — ARTHROPLASTY, KNEE, TOTAL
Anesthesia: General | Site: Knee | Laterality: Right

## 2022-12-18 MED ORDER — TRANEXAMIC ACID-NACL 1000-0.7 MG/100ML-% IV SOLN
1000.0000 mg | Freq: Once | INTRAVENOUS | Status: AC
  Administered 2022-12-18: 1000 mg via INTRAVENOUS
  Filled 2022-12-18: qty 100

## 2022-12-18 MED ORDER — LACTATED RINGERS IV SOLN: INTRAVENOUS | Status: DC

## 2022-12-18 MED ORDER — PRAVASTATIN SODIUM 10 MG PO TABS
10.0000 mg | ORAL_TABLET | Freq: Every day | ORAL | Status: DC
  Administered 2022-12-19: 10 mg via ORAL
  Filled 2022-12-18 (×2): qty 1

## 2022-12-18 MED ORDER — LEVOTHYROXINE SODIUM 100 MCG PO TABS
200.0000 ug | ORAL_TABLET | Freq: Every day | ORAL | Status: DC
  Administered 2022-12-19 – 2022-12-20 (×2): 200 ug via ORAL
  Filled 2022-12-18 (×2): qty 2

## 2022-12-18 MED ORDER — PHENYLEPHRINE HCL (PRESSORS) 10 MG/ML IV SOLN
INTRAVENOUS | Status: AC
  Filled 2022-12-18: qty 1

## 2022-12-18 MED ORDER — FENTANYL CITRATE PF 50 MCG/ML IJ SOSY
25.0000 ug | PREFILLED_SYRINGE | INTRAMUSCULAR | Status: DC | PRN
  Administered 2022-12-18: 50 ug via INTRAVENOUS

## 2022-12-18 MED ORDER — FENTANYL CITRATE (PF) 100 MCG/2ML IJ SOLN
INTRAMUSCULAR | Status: DC | PRN
  Administered 2022-12-18 (×3): 50 ug via INTRAVENOUS
  Administered 2022-12-18: 100 ug via INTRAVENOUS

## 2022-12-18 MED ORDER — ACETAMINOPHEN 325 MG PO TABS: 325.0000 mg | ORAL_TABLET | Freq: Four times a day (QID) | ORAL | Status: DC | PRN

## 2022-12-18 MED ORDER — ACETAMINOPHEN 500 MG PO TABS
1000.0000 mg | ORAL_TABLET | Freq: Four times a day (QID) | ORAL | Status: AC
  Administered 2022-12-18 – 2022-12-19 (×4): 1000 mg via ORAL
  Filled 2022-12-18 (×4): qty 2

## 2022-12-18 MED ORDER — POLYETHYLENE GLYCOL 3350 17 G PO PACK: 17.0000 g | PACK | Freq: Every day | ORAL | Status: DC | PRN

## 2022-12-18 MED ORDER — KETOROLAC TROMETHAMINE 30 MG/ML IJ SOLN
INTRAMUSCULAR | Status: AC
  Filled 2022-12-18: qty 1

## 2022-12-18 MED ORDER — LISINOPRIL 10 MG PO TABS
10.0000 mg | ORAL_TABLET | Freq: Every day | ORAL | Status: DC
  Administered 2022-12-19 – 2022-12-20 (×2): 10 mg via ORAL
  Filled 2022-12-18 (×2): qty 1

## 2022-12-18 MED ORDER — CEFAZOLIN IN SODIUM CHLORIDE 3-0.9 GM/100ML-% IV SOLN
3.0000 g | INTRAVENOUS | Status: AC
  Administered 2022-12-18: 3 g via INTRAVENOUS
  Filled 2022-12-18: qty 100

## 2022-12-18 MED ORDER — HYDROMORPHONE HCL 1 MG/ML IJ SOLN
INTRAMUSCULAR | Status: DC | PRN
  Administered 2022-12-18: 1 mg via INTRAVENOUS
  Administered 2022-12-18 (×2): .5 mg via INTRAVENOUS

## 2022-12-18 MED ORDER — DIPHENHYDRAMINE HCL 12.5 MG/5ML PO ELIX: 12.5000 mg | ORAL_SOLUTION | ORAL | Status: DC | PRN

## 2022-12-18 MED ORDER — MIDAZOLAM HCL 2 MG/2ML IJ SOLN
1.0000 mg | Freq: Once | INTRAMUSCULAR | Status: AC
  Administered 2022-12-18: 2 mg via INTRAVENOUS
  Filled 2022-12-18: qty 2

## 2022-12-18 MED ORDER — LIDOCAINE HCL (PF) 2 % IJ SOLN
INTRAMUSCULAR | Status: AC
  Filled 2022-12-18: qty 5

## 2022-12-18 MED ORDER — ONDANSETRON HCL 4 MG/2ML IJ SOLN: 4.0000 mg | Freq: Four times a day (QID) | INTRAMUSCULAR | Status: DC | PRN

## 2022-12-18 MED ORDER — PHENYLEPHRINE HCL (PRESSORS) 10 MG/ML IV SOLN
INTRAVENOUS | Status: DC | PRN
  Administered 2022-12-18 (×2): 80 ug via INTRAVENOUS

## 2022-12-18 MED ORDER — METOCLOPRAMIDE HCL 5 MG PO TABS: 5.0000 mg | ORAL_TABLET | Freq: Three times a day (TID) | ORAL | Status: DC | PRN

## 2022-12-18 MED ORDER — PROPOFOL 1000 MG/100ML IV EMUL
INTRAVENOUS | Status: AC
  Filled 2022-12-18: qty 100

## 2022-12-18 MED ORDER — WATER FOR IRRIGATION, STERILE IR SOLN
Status: DC | PRN
  Administered 2022-12-18: 2000 mL

## 2022-12-18 MED ORDER — BISACODYL 10 MG RE SUPP: 10.0000 mg | Freq: Every day | RECTAL | Status: DC | PRN

## 2022-12-18 MED ORDER — OXYCODONE HCL 5 MG PO TABS
5.0000 mg | ORAL_TABLET | Freq: Once | ORAL | Status: AC | PRN
  Administered 2022-12-18: 5 mg via ORAL

## 2022-12-18 MED ORDER — METHOCARBAMOL 500 MG PO TABS
500.0000 mg | ORAL_TABLET | Freq: Four times a day (QID) | ORAL | Status: DC | PRN
  Administered 2022-12-19 – 2022-12-20 (×2): 500 mg via ORAL
  Filled 2022-12-18 (×2): qty 1

## 2022-12-18 MED ORDER — FENTANYL CITRATE (PF) 250 MCG/5ML IJ SOLN
INTRAMUSCULAR | Status: AC
  Filled 2022-12-18: qty 5

## 2022-12-18 MED ORDER — METOCLOPRAMIDE HCL 5 MG/ML IJ SOLN: 5.0000 mg | Freq: Three times a day (TID) | INTRAMUSCULAR | Status: DC | PRN

## 2022-12-18 MED ORDER — PROPOFOL 10 MG/ML IV BOLUS
INTRAVENOUS | Status: AC
  Filled 2022-12-18: qty 20

## 2022-12-18 MED ORDER — PHENYLEPHRINE HCL-NACL 20-0.9 MG/250ML-% IV SOLN
INTRAVENOUS | Status: DC | PRN
  Administered 2022-12-18: 40 ug/min via INTRAVENOUS

## 2022-12-18 MED ORDER — POTASSIUM CHLORIDE IN NACL 20-0.9 MEQ/L-% IV SOLN
INTRAVENOUS | Status: DC
  Filled 2022-12-18 (×2): qty 1000

## 2022-12-18 MED ORDER — HYDROMORPHONE HCL 1 MG/ML IJ SOLN
0.5000 mg | INTRAMUSCULAR | Status: DC | PRN
  Filled 2022-12-18: qty 1

## 2022-12-18 MED ORDER — ONDANSETRON HCL 4 MG PO TABS: 4.0000 mg | ORAL_TABLET | Freq: Four times a day (QID) | ORAL | Status: DC | PRN

## 2022-12-18 MED ORDER — ONDANSETRON HCL 4 MG/2ML IJ SOLN
INTRAMUSCULAR | Status: DC | PRN
  Administered 2022-12-18: 4 mg via INTRAVENOUS

## 2022-12-18 MED ORDER — DEXAMETHASONE SODIUM PHOSPHATE 10 MG/ML IJ SOLN
INTRAMUSCULAR | Status: DC | PRN
  Administered 2022-12-18: 4 mg via INTRAVENOUS

## 2022-12-18 MED ORDER — BUPIVACAINE HCL (PF) 0.25 % IJ SOLN
INTRAMUSCULAR | Status: AC
  Filled 2022-12-18: qty 30

## 2022-12-18 MED ORDER — METFORMIN HCL 500 MG PO TABS
1000.0000 mg | ORAL_TABLET | Freq: Every day | ORAL | Status: DC
  Administered 2022-12-19 – 2022-12-20 (×2): 1000 mg via ORAL
  Filled 2022-12-18 (×2): qty 2

## 2022-12-18 MED ORDER — OXYCODONE HCL 5 MG PO TABS
10.0000 mg | ORAL_TABLET | ORAL | Status: DC | PRN
  Administered 2022-12-19: 10 mg via ORAL

## 2022-12-18 MED ORDER — SUGAMMADEX SODIUM 200 MG/2ML IV SOLN
INTRAVENOUS | Status: DC | PRN
  Administered 2022-12-18: 200 mg via INTRAVENOUS

## 2022-12-18 MED ORDER — CHLORHEXIDINE GLUCONATE 0.12 % MT SOLN
15.0000 mL | Freq: Once | OROMUCOSAL | Status: AC
  Administered 2022-12-18: 15 mL via OROMUCOSAL

## 2022-12-18 MED ORDER — PROPOFOL 10 MG/ML IV BOLUS
INTRAVENOUS | Status: DC | PRN
  Administered 2022-12-18: 20 mg via INTRAVENOUS
  Administered 2022-12-18: 150 mg via INTRAVENOUS
  Administered 2022-12-18: 30 mg via INTRAVENOUS

## 2022-12-18 MED ORDER — DEXAMETHASONE SODIUM PHOSPHATE 10 MG/ML IJ SOLN
INTRAMUSCULAR | Status: AC
  Filled 2022-12-18: qty 1

## 2022-12-18 MED ORDER — POVIDONE-IODINE 7.5 % EX SOLN: Freq: Once | CUTANEOUS | Status: DC

## 2022-12-18 MED ORDER — POVIDONE-IODINE 10 % EX SWAB
2.0000 | Freq: Once | CUTANEOUS | Status: AC
  Administered 2022-12-18: 2 via TOPICAL

## 2022-12-18 MED ORDER — TRANEXAMIC ACID-NACL 1000-0.7 MG/100ML-% IV SOLN
1000.0000 mg | INTRAVENOUS | Status: AC
  Administered 2022-12-18: 1000 mg via INTRAVENOUS
  Filled 2022-12-18: qty 100

## 2022-12-18 MED ORDER — TAMSULOSIN HCL 0.4 MG PO CAPS
0.4000 mg | ORAL_CAPSULE | Freq: Every day | ORAL | Status: DC
  Administered 2022-12-19 – 2022-12-20 (×2): 0.4 mg via ORAL
  Filled 2022-12-18 (×2): qty 1

## 2022-12-18 MED ORDER — MENTHOL 3 MG MT LOZG: 1.0000 | LOZENGE | OROMUCOSAL | Status: DC | PRN

## 2022-12-18 MED ORDER — AMISULPRIDE (ANTIEMETIC) 5 MG/2ML IV SOLN: 10.0000 mg | Freq: Once | INTRAVENOUS | Status: DC | PRN

## 2022-12-18 MED ORDER — 0.9 % SODIUM CHLORIDE (POUR BTL) OPTIME
TOPICAL | Status: DC | PRN
  Administered 2022-12-18: 1000 mL

## 2022-12-18 MED ORDER — OXYCODONE HCL 5 MG PO TABS
ORAL_TABLET | ORAL | Status: AC
  Filled 2022-12-18: qty 1

## 2022-12-18 MED ORDER — OXYCODONE HCL 5 MG PO TABS
5.0000 mg | ORAL_TABLET | ORAL | Status: DC | PRN
  Administered 2022-12-18 – 2022-12-20 (×8): 5 mg via ORAL
  Filled 2022-12-18: qty 1
  Filled 2022-12-18: qty 2
  Filled 2022-12-18 (×7): qty 1

## 2022-12-18 MED ORDER — ROPIVACAINE HCL 5 MG/ML IJ SOLN
INTRAMUSCULAR | Status: DC | PRN
  Administered 2022-12-18: 20 mL via PERINEURAL

## 2022-12-18 MED ORDER — INSULIN ASPART 100 UNIT/ML IJ SOLN
0.0000 [IU] | Freq: Three times a day (TID) | INTRAMUSCULAR | Status: DC
  Administered 2022-12-18: 3 [IU] via SUBCUTANEOUS

## 2022-12-18 MED ORDER — OXYCODONE HCL 5 MG/5ML PO SOLN: 5.0000 mg | Freq: Once | ORAL | Status: AC | PRN

## 2022-12-18 MED ORDER — CEFAZOLIN SODIUM-DEXTROSE 2-4 GM/100ML-% IV SOLN
2.0000 g | Freq: Four times a day (QID) | INTRAVENOUS | Status: AC
  Administered 2022-12-18 (×2): 2 g via INTRAVENOUS
  Filled 2022-12-18 (×2): qty 100

## 2022-12-18 MED ORDER — FENTANYL CITRATE PF 50 MCG/ML IJ SOSY
PREFILLED_SYRINGE | INTRAMUSCULAR | Status: AC
  Filled 2022-12-18: qty 1

## 2022-12-18 MED ORDER — ROCURONIUM BROMIDE 100 MG/10ML IV SOLN
INTRAVENOUS | Status: DC | PRN
  Administered 2022-12-18: 60 mg via INTRAVENOUS
  Administered 2022-12-18: 30 mg via INTRAVENOUS
  Administered 2022-12-18: 10 mg via INTRAVENOUS

## 2022-12-18 MED ORDER — MAGNESIUM CITRATE PO SOLN: 1.0000 | Freq: Once | ORAL | Status: DC | PRN

## 2022-12-18 MED ORDER — ORAL CARE MOUTH RINSE: 15.0000 mL | Freq: Once | OROMUCOSAL | Status: AC

## 2022-12-18 MED ORDER — METHOCARBAMOL 500 MG IVPB - SIMPLE MED: 500.0000 mg | Freq: Four times a day (QID) | INTRAVENOUS | Status: DC | PRN

## 2022-12-18 MED ORDER — INSULIN ASPART 100 UNIT/ML IJ SOLN: 0.0000 [IU] | INTRAMUSCULAR | Status: DC | PRN

## 2022-12-18 MED ORDER — DOCUSATE SODIUM 100 MG PO CAPS
100.0000 mg | ORAL_CAPSULE | Freq: Two times a day (BID) | ORAL | Status: DC
  Administered 2022-12-18 – 2022-12-20 (×4): 100 mg via ORAL
  Filled 2022-12-18 (×4): qty 1

## 2022-12-18 MED ORDER — ROCURONIUM BROMIDE 10 MG/ML (PF) SYRINGE
PREFILLED_SYRINGE | INTRAVENOUS | Status: AC
  Filled 2022-12-18: qty 10

## 2022-12-18 MED ORDER — KETOROLAC TROMETHAMINE 30 MG/ML IJ SOLN
INTRAMUSCULAR | Status: DC | PRN
  Administered 2022-12-18: 31 mL

## 2022-12-18 MED ORDER — HYDROCHLOROTHIAZIDE 12.5 MG PO TABS
12.5000 mg | ORAL_TABLET | Freq: Every day | ORAL | Status: DC
  Administered 2022-12-19 – 2022-12-20 (×2): 12.5 mg via ORAL
  Filled 2022-12-18 (×2): qty 1

## 2022-12-18 MED ORDER — MIDAZOLAM HCL 2 MG/2ML IJ SOLN
INTRAMUSCULAR | Status: AC
  Filled 2022-12-18: qty 2

## 2022-12-18 MED ORDER — HYDROMORPHONE HCL 2 MG/ML IJ SOLN
INTRAMUSCULAR | Status: AC
  Filled 2022-12-18: qty 1

## 2022-12-18 MED ORDER — SODIUM CHLORIDE 0.9 % IR SOLN
Status: DC | PRN
  Administered 2022-12-18: 1000 mL

## 2022-12-18 MED ORDER — PHENOL 1.4 % MT LIQD: 1.0000 | OROMUCOSAL | Status: DC | PRN

## 2022-12-18 MED ORDER — ONDANSETRON HCL 4 MG/2ML IJ SOLN
INTRAMUSCULAR | Status: AC
  Filled 2022-12-18: qty 2

## 2022-12-18 MED ORDER — ASPIRIN 325 MG PO TBEC
325.0000 mg | DELAYED_RELEASE_TABLET | Freq: Every day | ORAL | Status: DC
  Administered 2022-12-19 – 2022-12-20 (×2): 325 mg via ORAL
  Filled 2022-12-18 (×2): qty 1

## 2022-12-18 MED ORDER — FENTANYL CITRATE PF 50 MCG/ML IJ SOSY
50.0000 ug | PREFILLED_SYRINGE | Freq: Once | INTRAMUSCULAR | Status: AC
  Administered 2022-12-18: 50 ug via INTRAVENOUS
  Filled 2022-12-18: qty 2

## 2022-12-18 MED ORDER — ACETAMINOPHEN 500 MG PO TABS
1000.0000 mg | ORAL_TABLET | Freq: Once | ORAL | Status: AC
  Administered 2022-12-18: 1000 mg via ORAL
  Filled 2022-12-18: qty 2

## 2022-12-18 MED ORDER — ALUM & MAG HYDROXIDE-SIMETH 200-200-20 MG/5ML PO SUSP: 30.0000 mL | ORAL | Status: DC | PRN

## 2022-12-18 MED ORDER — POVIDONE-IODINE 10 % EX SWAB: 2.0000 | Freq: Once | CUTANEOUS | Status: DC

## 2022-12-18 SURGICAL SUPPLY — 56 items
ATTUNE MED DOME PAT 41 KNEE (Knees) IMPLANT
ATTUNE PS FEM RT SZ 8 CEM KNEE (Femur) IMPLANT
BAG COUNTER SPONGE SURGICOUNT (BAG) IMPLANT
BAG SPEC THK2 15X12 ZIP CLS (MISCELLANEOUS)
BAG SPNG CNTER NS LX DISP (BAG)
BAG ZIPLOCK 12X15 (MISCELLANEOUS) IMPLANT
BASEPLATE TIB CMT FB PCKT SZ 8 (Knees) IMPLANT
BLADE SAG 18X100X1.27 (BLADE) ×1 IMPLANT
BLADE SAW SGTL 11.0X1.19X90.0M (BLADE) IMPLANT
BLADE SAW SGTL 13X75X1.27 (BLADE) ×1 IMPLANT
BLADE SURG 15 STRL LF DISP TIS (BLADE) ×1 IMPLANT
BLADE SURG 15 STRL SS (BLADE) ×1
BNDG CMPR MED 10X6 ELC LF (GAUZE/BANDAGES/DRESSINGS) ×1
BNDG ELASTIC 6X10 VLCR STRL LF (GAUZE/BANDAGES/DRESSINGS) ×1 IMPLANT
BOWL SMART MIX CTS (DISPOSABLE) ×1 IMPLANT
BSPLAT TIB 8 CMNT FXBRNG STRL (Knees) ×1 IMPLANT
CATH COUDE 5CC RIBBED (CATHETERS) IMPLANT
CATH RIBBED COUDE 5CC (CATHETERS) ×1
CEMENT HV SMART SET (Cement) ×2 IMPLANT
CLSR STERI-STRIP ANTIMIC 1/2X4 (GAUZE/BANDAGES/DRESSINGS) ×2 IMPLANT
COVER SURGICAL LIGHT HANDLE (MISCELLANEOUS) ×1 IMPLANT
CUFF TOURN SGL QUICK 34 (TOURNIQUET CUFF) ×1
CUFF TRNQT CYL 34X4.125X (TOURNIQUET CUFF) ×1 IMPLANT
DRAPE SHEET LG 3/4 BI-LAMINATE (DRAPES) ×1 IMPLANT
DRAPE U-SHAPE 47X51 STRL (DRAPES) ×1 IMPLANT
DRSG MEPILEX POST OP 4X12 (GAUZE/BANDAGES/DRESSINGS) ×1 IMPLANT
DURAPREP 26ML APPLICATOR (WOUND CARE) ×2 IMPLANT
ELECT REM PT RETURN 15FT ADLT (MISCELLANEOUS) ×1 IMPLANT
GAUZE PAD ABD 8X10 STRL (GAUZE/BANDAGES/DRESSINGS) ×2 IMPLANT
GLOVE BIO SURGEON STRL SZ 6.5 (GLOVE) ×1 IMPLANT
GLOVE BIO SURGEON STRL SZ7.5 (GLOVE) ×1 IMPLANT
GLOVE BIOGEL PI IND STRL 7.0 (GLOVE) ×1 IMPLANT
GLOVE BIOGEL PI IND STRL 8 (GLOVE) ×1 IMPLANT
GOWN STRL SURGICAL XL XLNG (GOWN DISPOSABLE) ×2 IMPLANT
HANDPIECE INTERPULSE COAX TIP (DISPOSABLE) ×1
HOLDER FOLEY CATH W/STRAP (MISCELLANEOUS) IMPLANT
IMMOBILIZER KNEE 20 (SOFTGOODS) ×1
IMMOBILIZER KNEE 20 THIGH 36 (SOFTGOODS) ×1 IMPLANT
INSERT TIB ATTUNE FB SZ8X8 (Insert) IMPLANT
KIT TURNOVER KIT A (KITS) IMPLANT
MANIFOLD NEPTUNE II (INSTRUMENTS) ×1 IMPLANT
NS IRRIG 1000ML POUR BTL (IV SOLUTION) ×1 IMPLANT
PACK TOTAL KNEE CUSTOM (KITS) ×1 IMPLANT
PROTECTOR NERVE ULNAR (MISCELLANEOUS) ×1 IMPLANT
SET HNDPC FAN SPRY TIP SCT (DISPOSABLE) ×1 IMPLANT
SET PAD KNEE POSITIONER (MISCELLANEOUS) ×1 IMPLANT
SPIKE FLUID TRANSFER (MISCELLANEOUS) IMPLANT
SUT VIC AB 1 CT1 36 (SUTURE) ×2 IMPLANT
SUT VIC AB 2-0 CT1 27 (SUTURE) ×1
SUT VIC AB 2-0 CT1 TAPERPNT 27 (SUTURE) ×1 IMPLANT
SUT VIC AB 3-0 SH 27 (SUTURE)
SUT VIC AB 3-0 SH 27X BRD (SUTURE) IMPLANT
TRAY FOLEY MTR SLVR 16FR STAT (SET/KITS/TRAYS/PACK) ×1 IMPLANT
TUBE SUCTION HIGH CAP CLEAR NV (SUCTIONS) ×1 IMPLANT
WATER STERILE IRR 1000ML POUR (IV SOLUTION) ×2 IMPLANT
WRAP KNEE MAXI GEL POST OP (GAUZE/BANDAGES/DRESSINGS) ×1 IMPLANT

## 2022-12-18 NOTE — Anesthesia Postprocedure Evaluation (Signed)
Anesthesia Post Note  Patient: Dillon Jones  Procedure(s) Performed: TOTAL KNEE ARTHROPLASTY (Right: Knee)     Patient location during evaluation: PACU Anesthesia Type: General Level of consciousness: awake Pain management: pain level controlled Vital Signs Assessment: post-procedure vital signs reviewed and stable Respiratory status: spontaneous breathing, nonlabored ventilation and respiratory function stable Cardiovascular status: blood pressure returned to baseline and stable Postop Assessment: no apparent nausea or vomiting Anesthetic complications: no   No notable events documented.  Last Vitals:  Vitals:   12/18/22 1415 12/18/22 1432  BP: (!) 169/98 (!) 167/80  Pulse: 77 78  Resp:  19  Temp: 36.7 C 36.4 C  SpO2: 100% 100%    Last Pain:  Vitals:   12/18/22 1432  TempSrc: Oral  PainSc:                  [name]

## 2022-12-18 NOTE — Procedures (Signed)
Foley Catheter Placement Note  Indications: 72 y.o. male with buried penis undergo orthopedic knee surgery. Urology consulted for foley placement   Pre-operative Diagnosis: Buried penis   Post-operative Diagnosis: Same  Surgeon: Jerald Kief, MD, PhD  Assistants: None  Procedure Details  Patient was placed in the supine position. Evidence of buried penis. The area was prepped with Betadine and draped in the usual sterile fashion.  We injected lidocaine jelly per urethra prior to the procedure.  We then inserted a 14 [place] coude catheter per urethra which easily passed into the bladder without any resistance at the prostatic urethra.  We achieved return of clear yellow urine and then proceeded to insert 10 mL of sterile water into the Foley balloon.  The catheter was attached to a drainage bag and secured with a StatLock. Patient was turned back over to surgical team.                Complications: None; patient tolerated the procedure well.  Plan:   1.  Foley catheter per primary team.        Attending Attestation: Dr. Annabell Howells was available.

## 2022-12-18 NOTE — Op Note (Signed)
DATE OF SURGERY:  12/18/2022 TIME: 12:30 PM  PATIENT NAME:  Dillon Jones   AGE: 72 y.o.    PRE-OPERATIVE DIAGNOSIS: Right knee primary localized osteoarthritis  POST-OPERATIVE DIAGNOSIS:  Same  PROCEDURE: RIGHT total Knee Arthroplasty  SURGEON:  Eulas Post, MD   ASSISTANT:  Janine Ores, PA-C, present and scrubbed throughout the case, critical for assistance with exposure, retraction, instrumentation, and closure.   OPERATIVE IMPLANTS: Depuy Attune size 8 posterior Stabilized Femur, with a size 8 fixed Bearing Tibia, 8 polyethylene insert with a 41 medialized oval dome polyethylene patella.  PREOPERATIVE INDICATIONS:  Dillon Jones is a 72 y.o. year old male with end stage bone on bone degenerative arthritis of the knee who failed conservative treatment, including injections, antiinflammatories, activity modification, and assistive devices, and had significant impairment of their activities of daily living, and elected for Total Knee Arthroplasty.   The risks, benefits, and alternatives were discussed at length including but not limited to the risks of infection, bleeding, nerve injury, stiffness, blood clots, the need for revision surgery, cardiopulmonary complications, among others, and they were willing to proceed.  OPERATIVE FINDINGS AND UNIQUE ASPECTS OF THE CASE: He had significant tibiofemoral subluxation.  It was fairly challenging to balance.  The lateral femoral condyle was extremely hypoplastic with extreme wear.  I had to orient the cut based on Whitesides lines, because the anatomy was so distorted, it would have caused severe internal rotation of the femoral component.  He was probably between a size 8 and a size 9 on the tibia, but I had a good front to backfill with a size 8.  He had severe scalloping on both the medial and lateral tibial condyles.  I cut the femur off of a 10, and the tibia off of 3 measuring off of the medial side.  ESTIMATED BLOOD LOSS: 150  mL  OPERATIVE DESCRIPTION:  The patient was brought to the operative room and placed in a supine position.  Anesthesia was administered.  IV antibiotics were given.  The lower extremity was prepped and draped in the usual sterile fashion.  Time out was performed.  The leg was elevated and exsanguinated and the tourniquet was inflated.  Anterior quadriceps tendon splitting approach was performed.  The patella was everted and osteophytes were removed.  The anterior horn of the medial and lateral meniscus was removed.   The patella was then measured, and cut with the saw.  The thickness before the cut was 26 and after the cut was 16.  A metal shield was used to protect the patella throughout the case.    The distal femur was opened with the drill and the intramedullary distal femoral cutting jig was utilized, set at 5 degrees resecting 10 mm off the distal femur.  Care was taken to protect the collateral ligaments.  Then the extramedullary tibial cutting jig was utilized making the appropriate cut using the anterior tibial crest as a reference building in appropriate posterior slope.  Care was taken during the cut to protect the medial and collateral ligaments.  The proximal tibia was removed along with the posterior horns of the menisci.  The PCL was sacrificed.    The extensor gap was measured and found to have adequate resection, measuring to a size 8.    The distal femoral sizing jig was applied, taking care to avoid notching.  This was set at 3 degrees of external rotation.  Then the 4-in-1 cutting jig was applied and the anterior  and posterior femur was cut, along with the chamfer cuts.  All posterior osteophytes were removed.  The flexion gap was then measured and was symmetric with the extension gap.  I completed the distal femoral preparation using the appropriate jig to prepare the box.  The proximal tibia sized and prepared accordingly with the reamer and the punch, and then all  components were trialed with the poly insert.  The knee was found to have excellent balance and full motion.    The above named components were then cemented into place and all excess cement was removed.  The real polyethylene implant was placed.  After the cement had cured I released the tourniquet and confirmed excellent hemostasis with no major posterior vessel injury.    The knee was easily taken through a range of motion and the patella tracked well and the knee irrigated copiously and the parapatellar and subcutaneous tissue closed with vicryl, and monocryl with steri strips for the skin.  The wounds were injected with marcaine, and dressed with sterile gauze and the patient was awakened and returned to the PACU in stable and satisfactory condition.  There were no complications.  Total tourniquet time was 80 minutes.

## 2022-12-18 NOTE — Evaluation (Signed)
Physical Therapy Evaluation Patient Details Name: Dillon Jones MRN: [id number] DOB: 06-29-1950 Today's Date: 12/18/2022  History of Present Illness  Pt is 72 yo male s/p R TKA on 12/18/22.  Pt with hx including but not limited to arthritis, DM, GERD, HLD, HTN, gastric bypass 2013, morbid obesity, sleep apnea  Clinical Impression  Pt is s/p TKA resulting in the deficits listed below (see PT Problem List). At baseline, pt independent but using rollator and only able to ambulate short community distances due to knee pain.  He only as 1 step to enter and 2 steps inside his house.  Today, pt requiring min A for transfers and took a few steps to his chair.  His pain is 1/10 pre and post activity but increased with walking which limited distance.  Pt expected to progress well with therapy. Pt will benefit from acute skilled PT to increase their independence and safety with mobility to allow discharge.          Assistance Recommended at Discharge Frequent or constant Supervision/Assistance  If plan is discharge home, recommend the following:  Can travel by private vehicle  A little help with walking and/or transfers;A little help with bathing/dressing/bathroom;Help with stairs or ramp for entrance;Assistance with cooking/housework        Equipment Recommendations Rolling walker (2 wheels)  Recommendations for Other Services       Functional Status Assessment Patient has had a recent decline in their functional status and demonstrates the ability to make significant improvements in function in a reasonable and predictable amount of time.     Precautions / Restrictions Precautions Precautions: Fall;Knee Restrictions Weight Bearing Restrictions: Yes RLE Weight Bearing: Weight bearing as tolerated      Mobility  Bed Mobility Overal bed mobility: Needs Assistance Bed Mobility: Supine to Sit     Supine to sit: Min assist     General bed mobility comments: Min A for R LE and to lift  trunk; increased effort from pt with use of bed rail and HOB elevated    Transfers Overall transfer level: Needs assistance Equipment used: Rolling walker (2 wheels) Transfers: Sit to/from Stand Sit to Stand: Min guard, From elevated surface           General transfer comment: cues for hand placement    Ambulation/Gait Ambulation/Gait assistance: Min guard Gait Distance (Feet): 5 Feet Assistive device: Rolling walker (2 wheels) Gait Pattern/deviations: Step-to pattern, Decreased weight shift to right Gait velocity: decreased     General Gait Details: Cues for sequencing and RW use; small steps to chair only and needing to sit due to pain; pain eased back to 1/10 with rest  Stairs            Wheelchair Mobility     Tilt Bed    Modified Rankin (Stroke Patients Only)       Balance Overall balance assessment: Needs assistance Sitting-balance support: No upper extremity supported Sitting balance-Leahy Scale: Good     Standing balance support: Bilateral upper extremity supported, Reliant on assistive device for balance Standing balance-Leahy Scale: Poor                               Pertinent Vitals/Pain Pain Assessment Pain Assessment: 0-10 Pain Score: 1  Pain Location: R knee Pain Descriptors / Indicators: Discomfort Pain Intervention(s): Limited activity within patient's tolerance, Monitored during session, Premedicated before session    Home Living Family/patient expects to be  discharged to:: Private residence Living Arrangements: Spouse/significant other Available Help at Discharge: Family;Available 24 hours/day Type of Home: House Home Access: Stairs to enter Entrance Stairs-Rails: None Entrance Stairs-Number of Steps: 1 small step onto concrete pad   Home Layout: Other (Comment) (basically one level except 2 steps down into den - no rail but can grab framed doorway) Home Equipment: Shower seat;Grab bars - tub/shower;Hand held Nurse, mental health (4 wheels);Cane - single point      Prior Function Prior Level of Function : Driving;Independent/Modified Independent             Mobility Comments: Since November using rollator due to R knee pain and could only do short community ambulation ADLs Comments: independent adls and light iadls     Hand Dominance        Extremity/Trunk Assessment   Upper Extremity Assessment Upper Extremity Assessment: Overall WFL for tasks assessed    Lower Extremity Assessment Lower Extremity Assessment: RLE deficits/detail;LLE deficits/detail RLE Deficits / Details: Expected post op changes; ROM: knee ~10 to 80 degrees; MMT: 5/5 ankle, 2/5 knee, 2/5 hip LLE Deficits / Details: ROM WFL; MMT 5/5    Cervical / Trunk Assessment Cervical / Trunk Assessment: Normal  Communication   Communication: No difficulties  Cognition Arousal/Alertness: Awake/alert Behavior During Therapy: WFL for tasks assessed/performed Overall Cognitive Status: Within Functional Limits for tasks assessed                                          General Comments General comments (skin integrity, edema, etc.): vss    Exercises Total Joint Exercises Ankle Circles/Pumps: AROM, Both, 5 reps Quad Sets: AROM, 5 reps, Both Other Exercises Other Exercises: encouraged to perform quad sets and ankle pumps   Assessment/Plan    PT Assessment Patient needs continued PT services  PT Problem List Decreased strength;Decreased mobility;Decreased range of motion;Decreased activity tolerance;Decreased balance;Decreased knowledge of use of DME;Pain;Decreased knowledge of precautions       PT Treatment Interventions DME instruction;Therapeutic activities;Modalities;Gait training;Therapeutic exercise;Stair training;Functional mobility training;Patient/family education;Balance training    PT Goals (Current goals can be found in the Care Plan section)  Acute Rehab PT Goals Patient Stated Goal: return  home PT Goal Formulation: With patient/family Time For Goal Achievement: 01/01/23 Potential to Achieve Goals: Good    Frequency 7X/week     Co-evaluation               AM-PAC PT "6 Clicks" Mobility  Outcome Measure Help needed turning from your back to your side while in a flat bed without using bedrails?: A Little Help needed moving from lying on your back to sitting on the side of a flat bed without using bedrails?: A Little Help needed moving to and from a bed to a chair (including a wheelchair)?: A Little Help needed standing up from a chair using your arms (e.g., wheelchair or bedside chair)?: A Little Help needed to walk in hospital room?: A Little Help needed climbing 3-5 steps with a railing? : A Lot 6 Click Score: 17    End of Session Equipment Utilized During Treatment: Gait belt Activity Tolerance: Patient tolerated treatment well Patient left: in chair;with call bell/phone within reach;with chair alarm set;with family/visitor present Nurse Communication: Mobility status PT Visit Diagnosis: Other abnormalities of gait and mobility (R26.89);Muscle weakness (generalized) (M62.81)    Time: 1610-9604 PT Time Calculation (min) (ACUTE ONLY): 23  min   Charges:   PT Evaluation $PT Eval Low Complexity: 1 Low PT Treatments $Gait Training: 8-22 mins PT General Charges $$ ACUTE PT VISIT: 1 Visit         Anise Salvo, PT Acute Rehab [facility] [phone]   Rayetta Humphrey 12/18/2022, 5:42 PM

## 2022-12-18 NOTE — Discharge Instructions (Signed)
INSTRUCTIONS AFTER JOINT REPLACEMENT  ° °Remove items at home which could result in a fall. This includes throw rugs or furniture in walking pathways °ICE to the affected joint every three hours while awake for 30 minutes at a time, for at least the first 3-5 days, and then as needed for pain and swelling.  Continue to use ice for pain and swelling. You may notice swelling that will progress down to the foot and ankle.  This is normal after surgery.  Elevate your leg when you are not up walking on it.   °Continue to use the breathing machine you got in the hospital (incentive spirometer) which will help keep your temperature down.  It is common for your temperature to cycle up and down following surgery, especially at night when you are not up moving around and exerting yourself.  The breathing machine keeps your lungs expanded and your temperature down. ° ° °DIET:  As you were doing prior to hospitalization, we recommend a well-balanced diet. ° °DRESSING / WOUND CARE / SHOWERING ° °You may shower 3 days after surgery, but keep the wounds dry during showering.  You may use an occlusive plastic wrap (Press'n Seal for example), NO SOAKING/SUBMERGING IN THE BATHTUB.  If the bandage gets wet, change with a clean dry gauze.  If the incision gets wet, pat the wound dry with a clean towel. ° °ACTIVITY ° °Increase activity slowly as tolerated, but follow the weight bearing instructions below.   °No driving for 6 weeks or until further direction given by your physician.  You cannot drive while taking narcotics.  °No lifting or carrying greater than 10 lbs. until further directed by your surgeon. °Avoid periods of inactivity such as sitting longer than an hour when not asleep. This helps prevent blood clots.  °You may return to work once you are authorized by your doctor.  ° ° ° °WEIGHT BEARING  ° °Weight bearing as tolerated with assist device (walker, cane, etc) as directed, use it as long as suggested by your surgeon or  therapist, typically at least 4-6 weeks. ° ° °EXERCISES ° °Results after joint replacement surgery are often greatly improved when you follow the exercise, range of motion and muscle strengthening exercises prescribed by your doctor. Safety measures are also important to protect the joint from further injury. Any time any of these exercises cause you to have increased pain or swelling, decrease what you are doing until you are comfortable again and then slowly increase them. If you have problems or questions, call your caregiver or physical therapist for advice.  ° °Rehabilitation is important following a joint replacement. After just a few days of immobilization, the muscles of the leg can become weakened and shrink (atrophy).  These exercises are designed to build up the tone and strength of the thigh and leg muscles and to improve motion. Often times heat used for twenty to thirty minutes before working out will loosen up your tissues and help with improving the range of motion but do not use heat for the first two weeks following surgery (sometimes heat can increase post-operative swelling).  ° °These exercises can be done on a training (exercise) mat, on the floor, on a table or on a bed. Use whatever works the best and is most comfortable for you.    Use music or television while you are exercising so that the exercises are a pleasant break in your day. This will make your life better with the exercises acting   as a break in your routine that you can look forward to.   Perform all exercises about fifteen times, three times per day or as directed.  You should exercise both the operative leg and the other leg as well. ° °Exercises include: °  °Quad Sets - Tighten up the muscle on the front of the thigh (Quad) and hold for 5-10 seconds.   °Straight Leg Raises - With your knee straight (if you were given a brace, keep it on), lift the leg to 60 degrees, hold for 3 seconds, and slowly lower the leg.  Perform this  exercise against resistance later as your leg gets stronger.  °Leg Slides: Lying on your back, slowly slide your foot toward your buttocks, bending your knee up off the floor (only go as far as is comfortable). Then slowly slide your foot back down until your leg is flat on the floor again.  °Angel Wings: Lying on your back spread your legs to the side as far apart as you can without causing discomfort.  °Hamstring Strength:  Lying on your back, push your heel against the floor with your leg straight by tightening up the muscles of your buttocks.  Repeat, but this time bend your knee to a comfortable angle, and push your heel against the floor.  You may put a pillow under the heel to make it more comfortable if necessary.  ° °A rehabilitation program following joint replacement surgery can speed recovery and prevent re-injury in the future due to weakened muscles. Contact your doctor or a physical therapist for more information on knee rehabilitation.  ° ° °CONSTIPATION ° °Constipation is defined medically as fewer than three stools per week and severe constipation as less than one stool per week.  Even if you have a regular bowel pattern at home, your normal regimen is likely to be disrupted due to multiple reasons following surgery.  Combination of anesthesia, postoperative narcotics, change in appetite and fluid intake all can affect your bowels.  ° °YOU MUST use at least one of the following options; they are listed in order of increasing strength to get the job done.  They are all available over the counter, and you may need to use some, POSSIBLY even all of these options:   ° °Drink plenty of fluids (prune juice may be helpful) and high fiber foods °Colace 100 mg by mouth twice a day  °Senokot for constipation as directed and as needed Dulcolax (bisacodyl), take with full glass of water  °Miralax (polyethylene glycol) once or twice a day as needed. ° °If you have tried all these things and are unable to have a  bowel movement in the first 3-4 days after surgery call either your surgeon or your primary doctor.   ° °If you experience loose stools or diarrhea, hold the medications until you stool forms back up.  If your symptoms do not get better within 1 week or if they get worse, check with your doctor.  If you experience "the worst abdominal pain ever" or develop nausea or vomiting, please contact the office immediately for further recommendations for treatment. ° ° °ITCHING:  If you experience itching with your medications, try taking only a single pain pill, or even half a pain pill at a time.  You can also use Benadryl over the counter for itching or also to help with sleep.  ° °TED HOSE STOCKINGS:  Use stockings on both legs until for at least 2 weeks or as directed by   physician office. They may be removed at night for sleeping. ° °MEDICATIONS:  See your medication summary on the “After Visit Summary” that nursing will review with you.  You may have some home medications which will be placed on hold until you complete the course of blood thinner medication.  It is important for you to complete the blood thinner medication as prescribed. ° °PRECAUTIONS:  If you experience chest pain or shortness of breath - call 911 immediately for transfer to the hospital emergency department.  ° °If you develop a fever greater that 101 F, purulent drainage from wound, increased redness or drainage from wound, foul odor from the wound/dressing, or calf pain - CONTACT YOUR SURGEON.   °                                                °FOLLOW-UP APPOINTMENTS:  If you do not already have a post-op appointment, please call the office for an appointment to be seen by your surgeon.  Guidelines for how soon to be seen are listed in your “After Visit Summary”, but are typically between 1-4 weeks after surgery. ° °OTHER INSTRUCTIONS:  ° °Knee Replacement:  Do not place pillow under knee, focus on keeping the knee straight while resting.   ° ° °POST-OPERATIVE OPIOID TAPER INSTRUCTIONS: °It is important to wean off of your opioid medication as soon as possible. If you do not need pain medication after your surgery it is ok to stop day one. °Opioids include: °Codeine, Hydrocodone(Norco, Vicodin), Oxycodone(Percocet, oxycontin) and hydromorphone amongst others.  °Long term and even short term use of opiods can cause: °Increased pain response °Dependence °Constipation °Depression °Respiratory depression °And more.  °Withdrawal symptoms can include °Flu like symptoms °Nausea, vomiting °And more °Techniques to manage these symptoms °Hydrate well °Eat regular healthy meals °Stay active °Use relaxation techniques(deep breathing, meditating, yoga) °Do Not substitute Alcohol to help with tapering °If you have been on opioids for less than two weeks and do not have pain than it is ok to stop all together.  °Plan to wean off of opioids °This plan should start within one week post op of your joint replacement. °Maintain the same interval or time between taking each dose and first decrease the dose.  °Cut the total daily intake of opioids by one tablet each day °Next start to increase the time between doses. °The last dose that should be eliminated is the evening dose.  ° °MAKE SURE YOU:  °Understand these instructions.  °Get help right away if you are not doing well or get worse.  ° ° °Thank you for letting us be a part of your medical care team.  It is a privilege we respect greatly.  We hope these instructions will help you stay on track for a fast and full recovery!  ° ° °  °

## 2022-12-18 NOTE — Transfer of Care (Signed)
Immediate Anesthesia Transfer of Care Note  Patient: Dillon Jones  Procedure(s) Performed: TOTAL KNEE ARTHROPLASTY (Right: Knee)  Patient Location: PACU  Anesthesia Type:GA combined with regional for post-op pain  Level of Consciousness: awake, alert , oriented, and patient cooperative  Airway & Oxygen Therapy: Patient Spontanous Breathing and Patient connected to face mask oxygen  Post-op Assessment: Report given to RN and Post -op Vital signs reviewed and stable  Post vital signs: Reviewed and stable  Last Vitals:  Vitals Value Taken Time  BP 180/88 12/18/22 1315  Temp    Pulse 88 12/18/22 1319  Resp 11 12/18/22 1319  SpO2 95 % 12/18/22 1319  Vitals shown include unvalidated device data.  Last Pain:  Vitals:   12/18/22 0935  TempSrc:   PainSc: 0-No pain         Complications: No notable events documented.

## 2022-12-18 NOTE — Progress Notes (Signed)
   12/18/22 2030  BiPAP/CPAP/SIPAP  BiPAP/CPAP/SIPAP Pt Type Adult  Reason BIPAP/CPAP not in use Other(comment) (Forgot to bring CPAP equipment, made aware to notify if wanting set up.)  BiPAP/CPAP /SiPAP Vitals  Temp 97.8 F (36.6 C)  Pulse Rate 72  Resp 18  BP 114/69  SpO2 98 %  MEWS Score/Color  MEWS Score 0  MEWS Score Color Green

## 2022-12-18 NOTE — Plan of Care (Signed)
  Problem: Activity: Goal: Ability to avoid complications of mobility impairment will improve Outcome: Progressing   Problem: Education: Goal: Knowledge of the prescribed therapeutic regimen will improve Outcome: Progressing   Problem: Clinical Measurements: Goal: Postoperative complications will be avoided or minimized Outcome: Progressing   Problem: Pain Management: Goal: Pain level will decrease with appropriate interventions Outcome: Progressing   Problem: Skin Integrity: Goal: Will show signs of wound healing Outcome: Progressing

## 2022-12-18 NOTE — Anesthesia Procedure Notes (Signed)
Anesthesia Regional Block: Adductor canal block   Pre-Anesthetic Checklist: , timeout performed,  Correct Patient, Correct Site, Correct Laterality,  Correct Procedure, Correct Position, site marked,  Risks and benefits discussed,  Surgical consent,  Pre-op evaluation,  At surgeon's request and post-op pain management  Laterality: Right  Prep: chloraprep       Needles:  Injection technique: Single-shot  Needle Type: Echogenic Stimulator Needle     Needle Length: 9cm  Needle Gauge: 21     Additional Needles:   Procedures:,,,, ultrasound used (permanent image in chart),,    Narrative:  Start time: 12/18/2022 9:31 AM End time: 12/18/2022 9:33 AM Injection made incrementally with aspirations every 5 mL.  Performed by: Personally  Anesthesiologist: [name], MD  Additional Notes: Discussed risks and benefits of nerve block including, but not limited to, prolonged and/or permanent nerve injury involving sensory and/or motor function. Monitors were applied and a time-out was performed. The nerve and associated structures were visualized under ultrasound guidance. After negative aspiration, local anesthetic was slowly injected around the nerve. There was no evidence of high pressure during the procedure. There were no paresthesias. VSS remained stable and the patient tolerated the procedure well.

## 2022-12-18 NOTE — Anesthesia Procedure Notes (Signed)
Procedure Name: Intubation Date/Time: 12/18/2022 10:33 AM  Performed by: Johnette Abraham, CRNAPre-anesthesia Checklist: Patient identified, Emergency Drugs available, Suction available and Patient being monitored Patient Re-evaluated:Patient Re-evaluated prior to induction Oxygen Delivery Method: Circle System Utilized Preoxygenation: Pre-oxygenation with 100% oxygen Induction Type: IV induction Ventilation: Mask ventilation without difficulty Laryngoscope Size: Mac and 4 Grade View: Grade III Tube type: Oral Tube size: 8.0 mm Number of attempts: 2 Airway Equipment and Method: Stylet and Oral airway Placement Confirmation: ETT inserted through vocal cords under direct vision, positive ETCO2 and breath sounds checked- equal and bilateral Secured at: 24 cm Tube secured with: Tape Dental Injury: Teeth and Oropharynx as per pre-operative assessment

## 2022-12-18 NOTE — Interval H&P Note (Signed)
History and Physical Interval Note:  12/18/2022 9:35 AM  Dillon Jones  has presented today for surgery, with the diagnosis of right knee DJD.  The various methods of treatment have been discussed with the patient and family. After consideration of risks, benefits and other options for treatment, the patient has consented to  Procedure(s): TOTAL KNEE ARTHROPLASTY (Right) as a surgical intervention.  The patient's history has been reviewed, patient examined, no change in status, stable for surgery.  I have reviewed the patient's chart and labs.  Questions were answered to the patient's satisfaction.     Eulas Post

## 2022-12-19 ENCOUNTER — Encounter (HOSPITAL_COMMUNITY): Payer: Self-pay | Admitting: Orthopedic Surgery

## 2022-12-19 DIAGNOSIS — M1711 Unilateral primary osteoarthritis, right knee: Secondary | ICD-10-CM | POA: Diagnosis not present

## 2022-12-19 LAB — BASIC METABOLIC PANEL
Anion gap: 7 (ref 5–15)
BUN: 16 mg/dL (ref 8–23)
CO2: 25 mmol/L (ref 22–32)
Calcium: 8.2 mg/dL — ABNORMAL LOW (ref 8.9–10.3)
Chloride: 101 mmol/L (ref 98–111)
Creatinine, Ser: 1.08 mg/dL (ref 0.61–1.24)
GFR, Estimated: >60 mL/min (ref >60)
Glucose, Bld: 111 mg/dL — ABNORMAL HIGH (ref 70–99)
Potassium: 4.6 mmol/L (ref 3.5–5.1)
Sodium: 133 mmol/L — ABNORMAL LOW (ref 135–145)

## 2022-12-19 LAB — GLUCOSE, CAPILLARY
Glucose-Capillary: 107 mg/dL — ABNORMAL HIGH (ref 70–99)
Glucose-Capillary: 112 mg/dL — ABNORMAL HIGH (ref 70–99)
Glucose-Capillary: 114 mg/dL — ABNORMAL HIGH (ref 70–99)
Glucose-Capillary: 128 mg/dL — ABNORMAL HIGH (ref 70–99)

## 2022-12-19 LAB — CBC
HCT: 34.6 % — ABNORMAL LOW (ref 39.0–52.0)
Hemoglobin: 11.2 g/dL — ABNORMAL LOW (ref 13.0–17.0)
MCH: 30.4 pg (ref 26.0–34.0)
MCHC: 32.4 g/dL (ref 30.0–36.0)
MCV: 94 fL (ref 80.0–100.0)
Platelets: 157 10*3/uL (ref 150–400)
RBC: 3.68 MIL/uL — ABNORMAL LOW (ref 4.22–5.81)
RDW: 14.2 % (ref 11.5–15.5)
WBC: 9.4 10*3/uL (ref 4.0–10.5)
nRBC: 0 % (ref 0.0–0.2)

## 2022-12-19 NOTE — Plan of Care (Signed)
  Problem: Education: Goal: Knowledge of the prescribed therapeutic regimen will improve Outcome: Progressing   Problem: Pain Management: Goal: Pain level will decrease with appropriate interventions Outcome: Progressing   Problem: Activity: Goal: Ability to avoid complications of mobility impairment will improve Outcome: Progressing   

## 2022-12-19 NOTE — Plan of Care (Signed)
  Problem: Education: Goal: Knowledge of the prescribed therapeutic regimen will improve Outcome: Progressing   Problem: Activity: Goal: Range of joint motion will improve Outcome: Progressing   Problem: Pain Management: Goal: Pain level will decrease with appropriate interventions Outcome: Progressing   Problem: Safety: Goal: Ability to remain free from injury will improve Outcome: Progressing   

## 2022-12-19 NOTE — Progress Notes (Signed)
Physical Therapy Treatment Patient Details Name: Dillon Jones MRN: [id number] DOB: 03-23-1951 Today's Date: 12/19/2022   History of Present Illness Pt is 72 yo male s/p R TKA on 12/18/22.  Pt with hx including but not limited to arthritis, DM, GERD, HLD, HTN, gastric bypass 2013, morbid obesity, sleep apnea    PT Comments  POD # 1 am session Pt already OOB in recliner and voided with nursing.  Spouse present.  Assisted with amb in hallway was limited to 15 feet x 2 as pt required a seated rest break due to fatigue.  Unable to tolerate any TE's so will see pt again this afternoon.  Also need to practice stairs before D/C to home.      Assistance Recommended at Discharge Frequent or constant Supervision/Assistance  If plan is discharge home, recommend the following:  Can travel by private vehicle    A little help with walking and/or transfers;A little help with bathing/dressing/bathroom;Help with stairs or ramp for entrance;Assistance with cooking/housework      Equipment Recommendations  Rolling walker (2 wheels)    Recommendations for Other Services       Precautions / Restrictions Precautions Precautions: Fall;Knee Precaution Comments: no pillow under knee Restrictions Weight Bearing Restrictions: No RLE Weight Bearing: Weight bearing as tolerated     Mobility  Bed Mobility               General bed mobility comments: OOB in recliner    Transfers Overall transfer level: Needs assistance Equipment used: Rolling walker (2 wheels) Transfers: Sit to/from Stand Sit to Stand: Supervision           General transfer comment: cues for hand placement and increased time    Ambulation/Gait Ambulation/Gait assistance: Supervision, Min guard Gait Distance (Feet): 30 Feet (15 feet x 2 seated rest break) Assistive device: Rolling walker (2 wheels) Gait Pattern/deviations: Step-to pattern, Decreased weight shift to right Gait velocity: decreased     General Gait  Details: tolerated only 15 feet x 2 requiring a seated rest break due to fatigue.  Pain 7/10   Stairs             Wheelchair Mobility     Tilt Bed    Modified Rankin (Stroke Patients Only)       Balance                                            Cognition Arousal/Alertness: Awake/alert Behavior During Therapy: WFL for tasks assessed/performed Overall Cognitive Status: Within Functional Limits for tasks assessed                                 General Comments: AxO x 3 pleasant        Exercises      General Comments        Pertinent Vitals/Pain Pain Assessment Pain Assessment: 0-10 Pain Score: 7  Pain Location: R knee Pain Descriptors / Indicators: Aching, Grimacing, Operative site guarding Pain Intervention(s): Monitored during session, Premedicated before session, Repositioned, Ice applied    Home Living                          Prior Function            PT Goals (current goals can  now be found in the care plan section) Progress towards PT goals: Progressing toward goals    Frequency    7X/week      PT Plan Current plan remains appropriate    Co-evaluation              AM-PAC PT "6 Clicks" Mobility   Outcome Measure  Help needed turning from your back to your side while in a flat bed without using bedrails?: A Little Help needed moving from lying on your back to sitting on the side of a flat bed without using bedrails?: A Little Help needed moving to and from a bed to a chair (including a wheelchair)?: A Little Help needed standing up from a chair using your arms (e.g., wheelchair or bedside chair)?: A Little Help needed to walk in hospital room?: A Little Help needed climbing 3-5 steps with a railing? : A Little 6 Click Score: 18    End of Session Equipment Utilized During Treatment: Gait belt Activity Tolerance: Patient tolerated treatment well Patient left: in chair;with call  bell/phone within reach;with chair alarm set;with family/visitor present Nurse Communication: Mobility status PT Visit Diagnosis: Other abnormalities of gait and mobility (R26.89);Muscle weakness (generalized) (M62.81)     Time: 1324-4010 PT Time Calculation (min) (ACUTE ONLY): 25 min  Charges:    $Gait Training: 8-22 mins $Therapeutic Activity: 8-22 mins PT General Charges $$ ACUTE PT VISIT: 1 Visit                     Felecia Shelling  PTA Acute  Rehabilitation Services Office M-F          [phone]

## 2022-12-19 NOTE — TOC Transition Note (Signed)
Transition of Care [facility]) - CM/SW Discharge Note  Patient Details  Name: Dillon Jones MRN: [id number] Date of Birth: 07-Mar-1951  [facility] CM/SW Contact:  Ewing Schlein, LCSW Phone Number: 12/19/2022, 10:23 AM  Clinical Narrative: Patient is expected to discharge home after working with PT. CSW met with patient to confirm discharge plan and needs. Patient will go home with HHPT through [facility] and then will transition to OPPT at [facility]. Patient will need a rolling walker, which [employer] delivered to patient's room. TOC signing off.  Final next level of care: Home w Home Health Services Barriers to Discharge: No Barriers Identified  Patient Goals and CMS Choice CMS Medicare.gov Compare Post Acute Care list provided to:: Patient Choice offered to / list presented to : Patient  Discharge Plan and Services Additional resources added to the After Visit Summary for           DME Arranged: Walker rolling DME Agency: [employer] Representative spoke with at DME Agency: Prearranged in orthopedist's office HH Arranged: PT HH Agency: [facility] Representative spoke with at [facility] Agency: Prearranged in orthopedist's office  Social Determinants of Health (SDOH) Interventions SDOH Screenings   Food Insecurity: No Food Insecurity (12/18/2022)  Housing: Low Risk  (12/18/2022)  Transportation Needs: No Transportation Needs (12/18/2022)  Utilities: Not At Risk (12/18/2022)  Alcohol Screen: Low Risk  (10/18/2022)  Depression (PHQ2-9): Low Risk  (10/18/2022)  Financial Resource Strain: Low Risk  (10/18/2022)  Physical Activity: Inactive (10/18/2022)  Social Connections: Moderately Integrated (10/18/2022)  Stress: No Stress Concern Present (10/18/2022)  Tobacco Use: Low Risk  (12/19/2022)   Readmission Risk Interventions     No data to display

## 2022-12-19 NOTE — Progress Notes (Signed)
Physical Therapy Treatment Patient Details Name: Dillon Jones MRN: [id number] DOB: 1951/03/11 Today's Date: 12/19/2022   History of Present Illness Pt is 72 yo male s/p R TKA on 12/18/22.  Pt with hx including but not limited to arthritis, DM, GERD, HLD, HTN, gastric bypass 2013, morbid obesity, sleep apnea    PT Comments  POD # 1 pm session Focus was stair training.  General stair comments: NO RAILS so practiced up backward with walker.  Pt has ONE short step then 3 short steps.  Spouse present and observed.  Deomstarted then performed with pt. Then assisted to bathroom.  Extended treatment time due to rest periods between each activity. Reviewed HEP with Spouse while pt was in bathroom extended time.  Assisted back to bed to rest.  Max c/o fatigue.  Pt has completed all PT goals but may need another night due to Spouse amount of stairs and distance pt needs to amb from care to the house.  Assisted pt back to bed to "rest" and after a good rest Instructed to inform RN if D/C today vs tomorrow.      Assistance Recommended at Discharge Frequent or constant Supervision/Assistance  If plan is discharge home, recommend the following:  Can travel by private vehicle    A little help with walking and/or transfers;A little help with bathing/dressing/bathroom;Help with stairs or ramp for entrance;Assistance with cooking/housework      Equipment Recommendations  Rolling walker (2 wheels)    Recommendations for Other Services       Precautions / Restrictions Precautions Precautions: Fall;Knee Precaution Comments: no pillow under knee Restrictions Weight Bearing Restrictions: No RLE Weight Bearing: Weight bearing as tolerated     Mobility  Bed Mobility Overal bed mobility: Needs Assistance Bed Mobility: Sit to Supine       Sit to supine: Supervision, Min guard   General bed mobility comments: using strap pt was self able    Transfers Overall transfer level: Needs  assistance Equipment used: Rolling walker (2 wheels) Transfers: Sit to/from Stand Sit to Stand: Supervision           General transfer comment: cues for hand placement and increased time.  Also assisted with a toilet transfer.    Ambulation/Gait Ambulation/Gait assistance: Supervision Gait Distance (Feet): 24 Feet Assistive device: Rolling walker (2 wheels) Gait Pattern/deviations: Step-to pattern, Decreased weight shift to right Gait velocity: decreased     General Gait Details: amb to and from bathroom and to from stairs that was parked at doorway.  Limited amb distance.  Requires rest break between each.  "I just need a minute".   Stairs Stairs: Yes Stairs assistance: Min assist Stair Management: No rails, Step to pattern, Backwards Number of Stairs: 3 General stair comments: NO RAILS so practiced up backward with walker.  Pt has ONE short step then 3 short steps.  Spouse present and observed.  Deomstarted then performed with pt.   Wheelchair Mobility     Tilt Bed    Modified Rankin (Stroke Patients Only)       Balance                                            Cognition Arousal/Alertness: Awake/alert Behavior During Therapy: WFL for tasks assessed/performed Overall Cognitive Status: Within Functional Limits for tasks assessed  General Comments: AxO x 3 pleasant        Exercises      General Comments        Pertinent Vitals/Pain Pain Assessment Pain Assessment: 0-10 Pain Score: 7  Pain Location: R knee Pain Descriptors / Indicators: Aching, Grimacing, Operative site guarding Pain Intervention(s): Monitored during session, Premedicated before session, Repositioned, Ice applied    Home Living                          Prior Function            PT Goals (current goals can now be found in the care plan section) Progress towards PT goals: Progressing toward goals     Frequency    7X/week      PT Plan Current plan remains appropriate    Co-evaluation              AM-PAC PT "6 Clicks" Mobility   Outcome Measure  Help needed turning from your back to your side while in a flat bed without using bedrails?: A Little Help needed moving from lying on your back to sitting on the side of a flat bed without using bedrails?: A Little Help needed moving to and from a bed to a chair (including a wheelchair)?: A Little Help needed standing up from a chair using your arms (e.g., wheelchair or bedside chair)?: A Little Help needed to walk in hospital room?: A Little Help needed climbing 3-5 steps with a railing? : A Little 6 Click Score: 18    End of Session Equipment Utilized During Treatment: Gait belt Activity Tolerance: Patient limited by fatigue Patient left: in bed;with call bell/phone within reach;with family/visitor present Nurse Communication: Mobility status PT Visit Diagnosis: Other abnormalities of gait and mobility (R26.89);Muscle weakness (generalized) (M62.81)     Time: 7829-5621 PT Time Calculation (min) (ACUTE ONLY): 48 min  Charges:    $Gait Training: 8-22 mins $Therapeutic Activity: 23-37 mins PT General Charges $$ ACUTE PT VISIT: 1 Visit                     Felecia Shelling  PTA Acute  Rehabilitation Services Office M-F          [phone]

## 2022-12-19 NOTE — Plan of Care (Signed)
  Problem: Education: Goal: Knowledge of the prescribed therapeutic regimen will improve Outcome: Progressing Goal: Individualized Educational Video(s) Outcome: Completed/Met   Problem: Activity: Goal: Ability to avoid complications of mobility impairment will improve Outcome: Progressing Goal: Range of joint motion will improve Outcome: Adequate for Discharge   Problem: Clinical Measurements: Goal: Postoperative complications will be avoided or minimized Outcome: Progressing   Problem: Pain Management: Goal: Pain level will decrease with appropriate interventions Outcome: Adequate for Discharge   Problem: Skin Integrity: Goal: Will show signs of wound healing Outcome: Progressing   Problem: Education: Goal: Knowledge of General Education information will improve Description: Including pain rating scale, medication(s)/side effects and non-pharmacologic comfort measures Outcome: Progressing   Problem: Health Behavior/Discharge Planning: Goal: Ability to manage health-related needs will improve Outcome: Progressing   Problem: Clinical Measurements: Goal: Ability to maintain clinical measurements within normal limits will improve Outcome: Progressing Goal: Will remain free from infection Outcome: Progressing Goal: Diagnostic test results will improve Outcome: Progressing Goal: Respiratory complications will improve Outcome: Progressing Goal: Cardiovascular complication will be avoided Outcome: Progressing   Problem: Activity: Goal: Risk for activity intolerance will decrease Outcome: Adequate for Discharge   Problem: Nutrition: Goal: Adequate nutrition will be maintained Outcome: Completed/Met   Problem: Coping: Goal: Level of anxiety will decrease Outcome: Progressing   Problem: Elimination: Goal: Will not experience complications related to bowel motility Outcome: Progressing Goal: Will not experience complications related to urinary retention Outcome:  Progressing   Problem: Pain Managment: Goal: General experience of comfort will improve Outcome: Progressing   Problem: Safety: Goal: Ability to remain free from injury will improve Outcome: Progressing

## 2022-12-19 NOTE — Progress Notes (Signed)
   12/19/22 2102  BiPAP/CPAP/SIPAP  Reason BIPAP/CPAP not in use Non-compliant

## 2022-12-19 NOTE — Progress Notes (Signed)
Subjective: 1 Day Post-Op s/p Procedure(s): TOTAL KNEE ARTHROPLASTY   Patient is alert, oriented. States no pain overnight, worked well with PT yesterday. Foley was removed, has not yet voided on his own. Denies chest pain, SOB, Calf pain. No nausea/vomiting. No other complaints.     Objective:  PE: VITALS:   Vitals:   12/18/22 1732 12/18/22 2030 12/19/22 0636 12/19/22 0917  BP: (!) 140/73 114/69 122/80 116/70  Pulse: 78 72 71 86  Resp: 16 18 18 16   Temp: 97.7 F (36.5 C) 97.8 F (36.6 C) 97.8 F (36.6 C) 98.4 F (36.9 C)  TempSrc: Oral Oral Oral Oral  SpO2: 97% 98% 98% 95%  Weight:      Height:        ABD soft Sensation intact distally Intact pulses distally Dorsiflexion/Plantar flexion intact Incision: scant drainage  LABS  Results for orders placed or performed during the hospital encounter of 12/18/22 (from the past 24 hour(s))  Glucose, capillary     Status: Abnormal   Collection Time: 12/18/22  1:16 PM  Result Value Ref Range   Glucose-Capillary 142 (H) 70 - 99 mg/dL  Glucose, capillary     Status: Abnormal   Collection Time: 12/18/22  4:47 PM  Result Value Ref Range   Glucose-Capillary 171 (H) 70 - 99 mg/dL  Glucose, capillary     Status: Abnormal   Collection Time: 12/18/22  9:42 PM  Result Value Ref Range   Glucose-Capillary 117 (H) 70 - 99 mg/dL  CBC     Status: Abnormal   Collection Time: 12/19/22  4:00 AM  Result Value Ref Range   WBC 9.4 4.0 - 10.5 K/uL   RBC 3.68 (L) 4.22 - 5.81 MIL/uL   Hemoglobin 11.2 (L) 13.0 - 17.0 g/dL   HCT 13.0 (L) 86.5 - 78.4 %   MCV 94.0 80.0 - 100.0 fL   MCH 30.4 26.0 - 34.0 pg   MCHC 32.4 30.0 - 36.0 g/dL   RDW 69.6 29.5 - 28.4 %   Platelets 157 150 - 400 K/uL   nRBC 0.0 0.0 - 0.2 %  Basic metabolic panel     Status: Abnormal   Collection Time: 12/19/22  4:00 AM  Result Value Ref Range   Sodium 133 (L) 135 - 145 mmol/L   Potassium 4.6 3.5 - 5.1 mmol/L   Chloride 101 98 - 111 mmol/L   CO2 25 22 - 32  mmol/L   Glucose, Bld 111 (H) 70 - 99 mg/dL   BUN 16 8 - 23 mg/dL   Creatinine, Ser 1.32 0.61 - 1.24 mg/dL   Calcium 8.2 (L) 8.9 - 10.3 mg/dL   GFR, Estimated >44 >01 mL/min   Anion gap 7 5 - 15  Glucose, capillary     Status: Abnormal   Collection Time: 12/19/22  7:20 AM  Result Value Ref Range   Glucose-Capillary 107 (H) 70 - 99 mg/dL    DG Knee Right Port  Result Date: 12/18/2022 CLINICAL DATA:  Total knee replacement, right. EXAM: PORTABLE RIGHT KNEE - 1-2 VIEW COMPARISON:  None Available. FINDINGS: Right knee arthroplasty in expected alignment. No periprosthetic lucency or fracture. There has been patellar resurfacing. Recent postsurgical change includes air and edema in the soft tissues and joint space. IMPRESSION: Right knee arthroplasty without immediate postoperative complication. Electronically Signed   By: Narda Rutherford M.D.   On: 12/18/2022 14:51    Assessment/Plan: Principal Problem:   S/P total knee arthroplasty, right  1 Day Post-Op s/p Procedure(s): TOTAL KNEE ARTHROPLASTY - patient has history of prostate cancer, appreciate help from Dr. Gordan Payment and Dr. Annabell Howells regarding foley insertion prior to surgery. Catheter was pulled this morning, will keep an eye on his ability to void today. Urology recommended if not able to void on own to reinsert a size 14 foley and follow-up with urology.   Weightbearing: WBAT RLE Insicional and dressing care: Please call if new drainage seen after working with PT on mepilex VTE prophylaxis: Aspirin 325mg  BID  x 30 days Pain control: continue current regimen Follow - up plan: 2 weeks with Dr. Dion Saucier Dispo: pending voiding on own, passes PT. Ideally later this afternoon  Contact information:   Janine Ores, PA-C Weekdays 8-5  After hours and holidays please check [email or web address] for group call information for [facility]  Armida Sans 12/19/2022, 9:28 AM

## 2022-12-19 NOTE — Care Management Obs Status (Signed)
MEDICARE OBSERVATION STATUS NOTIFICATION   Patient Details  Name: Dillon Jones MRN: [id number] Date of Birth: 12/28/1950   Medicare Observation Status Notification Given:  Yes    Ewing Schlein, LCSW 12/19/2022, 11:24 AM

## 2022-12-20 DIAGNOSIS — M1711 Unilateral primary osteoarthritis, right knee: Secondary | ICD-10-CM | POA: Diagnosis not present

## 2022-12-20 LAB — CBC
HCT: 34.1 % — ABNORMAL LOW (ref 39.0–52.0)
Hemoglobin: 10.9 g/dL — ABNORMAL LOW (ref 13.0–17.0)
MCH: 30.4 pg (ref 26.0–34.0)
MCHC: 32 g/dL (ref 30.0–36.0)
MCV: 95.3 fL (ref 80.0–100.0)
Platelets: 154 10*3/uL (ref 150–400)
RBC: 3.58 MIL/uL — ABNORMAL LOW (ref 4.22–5.81)
RDW: 14.6 % (ref 11.5–15.5)
WBC: 7 10*3/uL (ref 4.0–10.5)
nRBC: 0 % (ref 0.0–0.2)

## 2022-12-20 LAB — GLUCOSE, CAPILLARY: Glucose-Capillary: 100 mg/dL — ABNORMAL HIGH (ref 70–99)

## 2022-12-20 MED ORDER — ASPIRIN 325 MG PO TBEC: 325.0000 mg | DELAYED_RELEASE_TABLET | Freq: Two times a day (BID) | ORAL | 0 refills | Status: DC

## 2022-12-20 MED ORDER — SENNA-DOCUSATE SODIUM 8.6-50 MG PO TABS: 2.0000 | ORAL_TABLET | Freq: Every day | ORAL | 1 refills | Status: DC

## 2022-12-20 MED ORDER — OXYCODONE HCL 5 MG PO TABS: 5.0000 mg | ORAL_TABLET | ORAL | 0 refills | Status: DC | PRN

## 2022-12-20 MED ORDER — ONDANSETRON HCL 4 MG PO TABS: 4.0000 mg | ORAL_TABLET | Freq: Three times a day (TID) | ORAL | 0 refills | Status: DC | PRN

## 2022-12-20 NOTE — Progress Notes (Signed)
Physical Therapy Treatment Patient Details Name: Dillon Jones MRN: [id number] DOB: 10-Oct-1950 Today's Date: 12/20/2022   History of Present Illness Pt is 72 yo male s/p R TKA on 12/18/22.  Pt with hx including but not limited to arthritis, DM, GERD, HLD, HTN, gastric bypass 2013, morbid obesity, sleep apnea    PT Comments  POD # 2 am session Assisted OOB to practice the stairs.      Assistance Recommended at Discharge Frequent or constant Supervision/Assistance  If plan is discharge home, recommend the following:  Can travel by private vehicle           Equipment Recommendations  Rolling walker (2 wheels)    Recommendations for Other Services       Precautions / Restrictions Precautions Precautions: Fall;Knee Precaution Comments: no pillow under knee Restrictions Weight Bearing Restrictions: No RLE Weight Bearing: Weight bearing as tolerated     Mobility  Bed Mobility Overal bed mobility: Needs Assistance Bed Mobility: Supine to Sit, Sit to Supine     Supine to sit: Supervision Sit to supine: Supervision   General bed mobility comments: using strap pt was self able    Transfers Overall transfer level: Needs assistance Equipment used: Rolling walker (2 wheels) Transfers: Sit to/from Stand Sit to Stand: Supervision           General transfer comment: cues for hand placement and increased time.  Also assisted with a toilet transfer.    Ambulation/Gait Ambulation/Gait assistance: Supervision Gait Distance (Feet): 55 Feet Assistive device: Rolling walker (2 wheels) Gait Pattern/deviations: Step-to pattern, Decreased weight shift to right Gait velocity: decreased     General Gait Details: tolerated amb an increased distance with Spouse "hands on" using safety belt.  Pt feeling much better and more confident today vs yesterday.   Stairs Stairs: Yes Stairs assistance: Min guard, Supervision Stair Management: No rails, Step to pattern,  Backwards Number of Stairs: 3 General stair comments: NO RAILS so practiced up backward with walker.  Pt has ONE short step then 3 short steps.  Spouse present and "hands on" assisted using safety belt.    Wheelchair Mobility     Tilt Bed    Modified Rankin (Stroke Patients Only)       Balance                                            Cognition Arousal/Alertness: Awake/alert Behavior During Therapy: WFL for tasks assessed/performed Overall Cognitive Status: Within Functional Limits for tasks assessed                                 General Comments: AxO x 3 pleasant        Exercises      General Comments        Pertinent Vitals/Pain Pain Assessment Pain Assessment: 0-10 Pain Score: 4  Pain Location: R knee Pain Descriptors / Indicators: Aching, Grimacing, Operative site guarding Pain Intervention(s): Monitored during session, Premedicated before session, Repositioned, Ice applied    Home Living                          Prior Function            PT Goals (current goals can now be found in the care plan section)  Progress towards PT goals: Progressing toward goals    Frequency    7X/week      PT Plan Current plan remains appropriate    Co-evaluation              AM-PAC PT "6 Clicks" Mobility   Outcome Measure  Help needed turning from your back to your side while in a flat bed without using bedrails?: None Help needed moving from lying on your back to sitting on the side of a flat bed without using bedrails?: None Help needed moving to and from a bed to a chair (including a wheelchair)?: None Help needed standing up from a chair using your arms (e.g., wheelchair or bedside chair)?: None Help needed to walk in hospital room?: A Little Help needed climbing 3-5 steps with a railing? : A Little 6 Click Score: 22    End of Session Equipment Utilized During Treatment: Gait belt Activity Tolerance:  Patient tolerated treatment well Patient left: in bed;with call bell/phone within reach;with family/visitor present Nurse Communication: Mobility status PT Visit Diagnosis: Other abnormalities of gait and mobility (R26.89);Muscle weakness (generalized) (M62.81)     Time: 7829-5621 PT Time Calculation (min) (ACUTE ONLY): 26 min  Charges:    $Gait Training: 8-22 mins $Therapeutic Exercise: 8-22 mins PT General Charges $$ ACUTE PT VISIT: 1 Visit                     Felecia Shelling  PTA Acute  Rehabilitation Services Office M-F          [phone]

## 2022-12-20 NOTE — Plan of Care (Signed)
  Problem: Education: Goal: Knowledge of the prescribed therapeutic regimen will improve Outcome: Adequate for Discharge   Problem: Activity: Goal: Ability to avoid complications of mobility impairment will improve Outcome: Adequate for Discharge Goal: Range of joint motion will improve Outcome: Adequate for Discharge   Problem: Clinical Measurements: Goal: Postoperative complications will be avoided or minimized Outcome: Adequate for Discharge   Problem: Pain Management: Goal: Pain level will decrease with appropriate interventions Outcome: Adequate for Discharge   Problem: Skin Integrity: Goal: Will show signs of wound healing Outcome: Adequate for Discharge   Problem: Education: Goal: Knowledge of General Education information will improve Description: Including pain rating scale, medication(s)/side effects and non-pharmacologic comfort measures Outcome: Adequate for Discharge   Problem: Health Behavior/Discharge Planning: Goal: Ability to manage health-related needs will improve Outcome: Adequate for Discharge   Problem: Clinical Measurements: Goal: Ability to maintain clinical measurements within normal limits will improve Outcome: Adequate for Discharge Goal: Will remain free from infection Outcome: Adequate for Discharge Goal: Diagnostic test results will improve Outcome: Adequate for Discharge Goal: Respiratory complications will improve Outcome: Adequate for Discharge Goal: Cardiovascular complication will be avoided Outcome: Adequate for Discharge   Problem: Activity: Goal: Risk for activity intolerance will decrease Outcome: Adequate for Discharge   Problem: Coping: Goal: Level of anxiety will decrease Outcome: Adequate for Discharge   Problem: Elimination: Goal: Will not experience complications related to bowel motility Outcome: Adequate for Discharge Goal: Will not experience complications related to urinary retention Outcome: Adequate for  Discharge   Problem: Pain Managment: Goal: General experience of comfort will improve Outcome: Adequate for Discharge   Problem: Safety: Goal: Ability to remain free from injury will improve Outcome: Adequate for Discharge   Problem: Skin Integrity: Goal: Risk for impaired skin integrity will decrease Outcome: Adequate for Discharge   Problem: Education: Goal: Ability to describe self-care measures that may prevent or decrease complications (Diabetes Survival Skills Education) will improve Outcome: Adequate for Discharge Goal: Individualized Educational Video(s) Outcome: Adequate for Discharge   Problem: Coping: Goal: Ability to adjust to condition or change in health will improve Outcome: Adequate for Discharge   Problem: Fluid Volume: Goal: Ability to maintain a balanced intake and output will improve Outcome: Adequate for Discharge   Problem: Health Behavior/Discharge Planning: Goal: Ability to identify and utilize available resources and services will improve Outcome: Adequate for Discharge Goal: Ability to manage health-related needs will improve Outcome: Adequate for Discharge   Problem: Metabolic: Goal: Ability to maintain appropriate glucose levels will improve Outcome: Adequate for Discharge   Problem: Nutritional: Goal: Maintenance of adequate nutrition will improve Outcome: Adequate for Discharge Goal: Progress toward achieving an optimal weight will improve Outcome: Adequate for Discharge   Problem: Skin Integrity: Goal: Risk for impaired skin integrity will decrease Outcome: Adequate for Discharge   Problem: Tissue Perfusion: Goal: Adequacy of tissue perfusion will improve Outcome: Adequate for Discharge

## 2022-12-20 NOTE — Progress Notes (Signed)
Reviewed written d/c instructions w pt and his wife, all questions answered and they both verbalized understanding. D/C via w/c w all belongings in stable condition.

## 2022-12-20 NOTE — Progress Notes (Signed)
Subjective: 2 Days Post-Op s/p Procedure(s): TOTAL KNEE ARTHROPLASTY   Patient is alert, oriented. Had a little nausea with oxy 10's yesterday, better with oxy 5's only. Voiding on own. Denies chest pain, SOB, Calf pain. No nausea/vomiting. No other complaints.     Objective:  PE: VITALS:   Vitals:   12/19/22 0917 12/19/22 1749 12/19/22 2046 12/20/22 0555  BP: 116/70 131/85 (!) 148/94 (!) 129/94  Pulse: 86 86 88 87  Resp: 16 15 18 17   Temp: 98.4 F (36.9 C) 98.3 F (36.8 C) 98.4 F (36.9 C) 98 F (36.7 C)  TempSrc: Oral Oral Oral Oral  SpO2: 95% 97% 97% 96%  Weight:      Height:        ABD soft Sensation intact distally Intact pulses distally Dorsiflexion/Plantar flexion intact Incision: scant drainage  LABS  Results for orders placed or performed during the hospital encounter of 12/18/22 (from the past 24 hour(s))  Glucose, capillary     Status: Abnormal   Collection Time: 12/19/22 11:30 AM  Result Value Ref Range   Glucose-Capillary 112 (H) 70 - 99 mg/dL  Glucose, capillary     Status: Abnormal   Collection Time: 12/19/22  4:33 PM  Result Value Ref Range   Glucose-Capillary 114 (H) 70 - 99 mg/dL  Glucose, capillary     Status: Abnormal   Collection Time: 12/19/22  9:39 PM  Result Value Ref Range   Glucose-Capillary 128 (H) 70 - 99 mg/dL  CBC     Status: Abnormal   Collection Time: 12/20/22  3:53 AM  Result Value Ref Range   WBC 7.0 4.0 - 10.5 K/uL   RBC 3.58 (L) 4.22 - 5.81 MIL/uL   Hemoglobin 10.9 (L) 13.0 - 17.0 g/dL   HCT 16.1 (L) 09.6 - 04.5 %   MCV 95.3 80.0 - 100.0 fL   MCH 30.4 26.0 - 34.0 pg   MCHC 32.0 30.0 - 36.0 g/dL   RDW 40.9 81.1 - 91.4 %   Platelets 154 150 - 400 K/uL   nRBC 0.0 0.0 - 0.2 %  Glucose, capillary     Status: Abnormal   Collection Time: 12/20/22  7:34 AM  Result Value Ref Range   Glucose-Capillary 100 (H) 70 - 99 mg/dL    DG Knee Right Port  Result Date: 12/18/2022 CLINICAL DATA:  Total knee replacement, right.  EXAM: PORTABLE RIGHT KNEE - 1-2 VIEW COMPARISON:  None Available. FINDINGS: Right knee arthroplasty in expected alignment. No periprosthetic lucency or fracture. There has been patellar resurfacing. Recent postsurgical change includes air and edema in the soft tissues and joint space. IMPRESSION: Right knee arthroplasty without immediate postoperative complication. Electronically Signed   By: Narda Rutherford M.D.   On: 12/18/2022 14:51    Assessment/Plan: Principal Problem:   S/P total knee arthroplasty, right   2 Days Post-Op s/p Procedure(s): TOTAL KNEE ARTHROPLASTY   Weightbearing: WBAT RLE, up with therapy Insicional and dressing care: Please call if new drainage seen after working with PT on mepilex VTE prophylaxis: Aspirin 325mg  BID  x 30 days Pain control: continue current regimen Follow - up plan: 2 weeks with Dr. Dion Saucier Dispo: ok to discharge when passes PT, feels ready to go home, may need 2 PT sessions today. Ideally later this afternoon  Contact information:   Janine Ores, PA-C Weekdays 8-5  After hours and holidays please check [email or web address] for group call information for [facility]  Armida Sans 12/20/2022, 8:39 AM

## 2022-12-22 DIAGNOSIS — Z471 Aftercare following joint replacement surgery: Secondary | ICD-10-CM | POA: Diagnosis not present

## 2022-12-22 DIAGNOSIS — E7800 Pure hypercholesterolemia, unspecified: Secondary | ICD-10-CM | POA: Diagnosis not present

## 2022-12-22 DIAGNOSIS — D649 Anemia, unspecified: Secondary | ICD-10-CM | POA: Diagnosis not present

## 2022-12-22 DIAGNOSIS — Z9989 Dependence on other enabling machines and devices: Secondary | ICD-10-CM | POA: Diagnosis not present

## 2022-12-22 DIAGNOSIS — Z96653 Presence of artificial knee joint, bilateral: Secondary | ICD-10-CM | POA: Diagnosis not present

## 2022-12-22 DIAGNOSIS — E119 Type 2 diabetes mellitus without complications: Secondary | ICD-10-CM | POA: Diagnosis not present

## 2022-12-22 DIAGNOSIS — G4733 Obstructive sleep apnea (adult) (pediatric): Secondary | ICD-10-CM | POA: Diagnosis not present

## 2022-12-22 DIAGNOSIS — Z7984 Long term (current) use of oral hypoglycemic drugs: Secondary | ICD-10-CM | POA: Diagnosis not present

## 2022-12-22 DIAGNOSIS — C61 Malignant neoplasm of prostate: Secondary | ICD-10-CM | POA: Diagnosis not present

## 2022-12-22 DIAGNOSIS — I10 Essential (primary) hypertension: Secondary | ICD-10-CM | POA: Diagnosis not present

## 2022-12-24 NOTE — Discharge Summary (Signed)
Discharge Summary  Patient ID: DENISE BRAMBLETT MRN: [id number] DOB/AGE: [birth year] 72 y.o.  Admit date: 12/18/2022 Discharge date: 12/20/22  Admission Diagnoses:  S/P total knee arthroplasty, right  Discharge Diagnoses:  Principal Problem:   S/P total knee arthroplasty, right   Past Medical History:  Diagnosis Date   Anemia    Arthritis    Diabetes mellitus    Has had for 13 years, type 2   Diabetes mellitus-type II 02/06/2012   GERD (gastroesophageal reflux disease)    Occasional - only 1x q23m   H/O umbilical hernia repair-mesh used; done in [place] 02/06/2012   Hypercholesteremia 02/06/2012   Hyperlipidemia    Hypertension    Hypothyroidism    Lap Roux Y Gastric Bypass Dec 2013 06/09/2012   Morbid obesity (HCC)    Morbid obesity with BMI of 55.8 06/05/2012   OSA on CPAP 02/06/2012   Prostate cancer (HCC)    radioactive seeds   Sleep apnea     Surgeries: Procedure(s): TOTAL KNEE ARTHROPLASTY on 12/18/2022   Consultants (if any):   Discharged Condition: Improved  Hospital Course: BINYAMIN NELIS is an 72 y.o. male who was admitted 12/18/2022 with a diagnosis of S/P total knee arthroplasty, right and went to the operating room on 12/18/2022 and underwent the above named procedures.    He was given perioperative antibiotics:  Anti-infectives (From admission, onward)    Start     Dose/Rate Route Frequency Ordered Stop   12/18/22 1630  ceFAZolin (ANCEF) IVPB 2g/100 mL premix        2 g 200 mL/hr over 30 Minutes Intravenous Every 6 hours 12/18/22 1428 12/19/22 0000   12/18/22 0830  ceFAZolin (ANCEF) IVPB 3g/100 mL premix        3 g 200 mL/hr over 30 Minutes Intravenous On call to O.R. 12/18/22 0827 12/18/22 1101     .  He was given sequential compression devices, early ambulation, and aspirin for DVT prophylaxis.  He benefited maximally from the hospital stay and there were no complications.    Recent vital signs:  Vitals:   12/19/22 2046 12/20/22 0555  BP: (!)  148/94 (!) 129/94  Pulse: 88 87  Resp: 18 17  Temp: 98.4 F (36.9 C) 98 F (36.7 C)  SpO2: 97% 96%    Recent laboratory studies:  Lab Results  Component Value Date   HGB 10.9 (L) 12/20/2022   HGB 11.2 (L) 12/19/2022   HGB 13.1 12/05/2022   Lab Results  Component Value Date   WBC 7.0 12/20/2022   PLT 154 12/20/2022   Lab Results  Component Value Date   INR 0.9 12/03/2018   Lab Results  Component Value Date   NA 133 (L) 12/19/2022   K 4.6 12/19/2022   CL 101 12/19/2022   CO2 25 12/19/2022   BUN 16 12/19/2022   CREATININE 1.08 12/19/2022   GLUCOSE 111 (H) 12/19/2022    Discharge Medications:   Allergies as of 12/20/2022   No Known Allergies      Medication List     TAKE these medications    aspirin EC 325 MG tablet Take 1 tablet (325 mg total) by mouth 2 (two) times daily.   B-12 PO Take 1 tablet by mouth daily.   CALCIUM PO Take 500 mg by mouth 3 (three) times daily.   hydrochlorothiazide 12.5 MG capsule Commonly known as: MICROZIDE TAKE 1 CAPSULE BY MOUTH EVERY DAY What changed: how much to take   ketoconazole 2 % cream  Commonly known as: NIZORAL Apply 1 Application topically 2 (two) times daily. What changed:  when to take this reasons to take this   levothyroxine 200 MCG tablet Commonly known as: SYNTHROID TAKE 1 TABLET BY MOUTH EVERY DAY EVERYDAY EXCEPT MONDAY AND FRIDAY. TAKE 2 TIMES DAILY ON MONDAY AND FRIDAY What changed: See the new instructions.   lisinopril 10 MG tablet Commonly known as: ZESTRIL TAKE 1 TABLET BY MOUTH EVERY DAY   metFORMIN 1000 MG tablet Commonly known as: GLUCOPHAGE Take 1 tablet (1,000 mg total) by mouth daily with breakfast.   multivitamin with minerals Tabs tablet Take 2 tablets by mouth daily.   ondansetron 4 MG tablet Commonly known as: Zofran Take 1 tablet (4 mg total) by mouth every 8 (eight) hours as needed for nausea or vomiting.   oxyCODONE 5 MG immediate release tablet Commonly known as:  Roxicodone Take 1 tablet (5 mg total) by mouth every 4 (four) hours as needed for severe pain.   pravastatin 10 MG tablet Commonly known as: PRAVACHOL TAKE 1 TABLET BY MOUTH EVERY DAY   sennosides-docusate sodium 8.6-50 MG tablet Commonly known as: SENOKOT-S Take 2 tablets by mouth daily.        Diagnostic Studies: DG Knee Right Port  Result Date: 12/18/2022 CLINICAL DATA:  Total knee replacement, right. EXAM: PORTABLE RIGHT KNEE - 1-2 VIEW COMPARISON:  None Available. FINDINGS: Right knee arthroplasty in expected alignment. No periprosthetic lucency or fracture. There has been patellar resurfacing. Recent postsurgical change includes air and edema in the soft tissues and joint space. IMPRESSION: Right knee arthroplasty without immediate postoperative complication. Electronically Signed   By: Narda Rutherford M.D.   On: 12/18/2022 14:51    Disposition: Discharge disposition: 01-Home or Self Care          Follow-up Information     Teryl Lucy, MD. Go on 12/31/2022.   Specialty: Orthopedic Surgery Why: Your appointment is scheduled for 4:15. Contact information: [address] [place] [address] [phone]         [facility] Follow up.   Why: HHPT will provide 6 visits at home prior to starting outpatient physical therapy Contact information: [address] [place] [address] [phone]         [facility] Go on 12/31/2022.   Why: YOur therapy appointment is scheduled for 3:15.  Please arrive at 3:00 to complete your paperwork Contact information: [facility] [address] [place] [address] [phone]                  Signed: Annita Brod 12/24/2022, 12:24 PM

## 2022-12-31 DIAGNOSIS — R262 Difficulty in walking, not elsewhere classified: Secondary | ICD-10-CM | POA: Diagnosis not present

## 2022-12-31 DIAGNOSIS — M25661 Stiffness of right knee, not elsewhere classified: Secondary | ICD-10-CM | POA: Diagnosis not present

## 2022-12-31 DIAGNOSIS — M1711 Unilateral primary osteoarthritis, right knee: Secondary | ICD-10-CM | POA: Diagnosis not present

## 2022-12-31 DIAGNOSIS — M6281 Muscle weakness (generalized): Secondary | ICD-10-CM | POA: Diagnosis not present

## 2023-01-03 DIAGNOSIS — M1711 Unilateral primary osteoarthritis, right knee: Secondary | ICD-10-CM | POA: Diagnosis not present

## 2023-01-07 DIAGNOSIS — M1711 Unilateral primary osteoarthritis, right knee: Secondary | ICD-10-CM | POA: Diagnosis not present

## 2023-01-07 DIAGNOSIS — R262 Difficulty in walking, not elsewhere classified: Secondary | ICD-10-CM | POA: Diagnosis not present

## 2023-01-07 DIAGNOSIS — M6281 Muscle weakness (generalized): Secondary | ICD-10-CM | POA: Diagnosis not present

## 2023-01-07 DIAGNOSIS — M25661 Stiffness of right knee, not elsewhere classified: Secondary | ICD-10-CM | POA: Diagnosis not present

## 2023-01-10 DIAGNOSIS — M6281 Muscle weakness (generalized): Secondary | ICD-10-CM | POA: Diagnosis not present

## 2023-01-10 DIAGNOSIS — R262 Difficulty in walking, not elsewhere classified: Secondary | ICD-10-CM | POA: Diagnosis not present

## 2023-01-10 DIAGNOSIS — M1711 Unilateral primary osteoarthritis, right knee: Secondary | ICD-10-CM | POA: Diagnosis not present

## 2023-01-10 DIAGNOSIS — M25661 Stiffness of right knee, not elsewhere classified: Secondary | ICD-10-CM | POA: Diagnosis not present

## 2023-01-14 DIAGNOSIS — M25661 Stiffness of right knee, not elsewhere classified: Secondary | ICD-10-CM | POA: Diagnosis not present

## 2023-01-14 DIAGNOSIS — M6281 Muscle weakness (generalized): Secondary | ICD-10-CM | POA: Diagnosis not present

## 2023-01-14 DIAGNOSIS — R262 Difficulty in walking, not elsewhere classified: Secondary | ICD-10-CM | POA: Diagnosis not present

## 2023-01-14 DIAGNOSIS — M1711 Unilateral primary osteoarthritis, right knee: Secondary | ICD-10-CM | POA: Diagnosis not present

## 2023-01-17 DIAGNOSIS — R262 Difficulty in walking, not elsewhere classified: Secondary | ICD-10-CM | POA: Diagnosis not present

## 2023-01-17 DIAGNOSIS — M6281 Muscle weakness (generalized): Secondary | ICD-10-CM | POA: Diagnosis not present

## 2023-01-17 DIAGNOSIS — M1711 Unilateral primary osteoarthritis, right knee: Secondary | ICD-10-CM | POA: Diagnosis not present

## 2023-01-17 DIAGNOSIS — M25661 Stiffness of right knee, not elsewhere classified: Secondary | ICD-10-CM | POA: Diagnosis not present

## 2023-01-22 DIAGNOSIS — R262 Difficulty in walking, not elsewhere classified: Secondary | ICD-10-CM | POA: Diagnosis not present

## 2023-01-22 DIAGNOSIS — M25661 Stiffness of right knee, not elsewhere classified: Secondary | ICD-10-CM | POA: Diagnosis not present

## 2023-01-22 DIAGNOSIS — M1711 Unilateral primary osteoarthritis, right knee: Secondary | ICD-10-CM | POA: Diagnosis not present

## 2023-01-22 DIAGNOSIS — M6281 Muscle weakness (generalized): Secondary | ICD-10-CM | POA: Diagnosis not present

## 2023-01-24 DIAGNOSIS — M1711 Unilateral primary osteoarthritis, right knee: Secondary | ICD-10-CM | POA: Diagnosis not present

## 2023-01-24 DIAGNOSIS — R262 Difficulty in walking, not elsewhere classified: Secondary | ICD-10-CM | POA: Diagnosis not present

## 2023-01-24 DIAGNOSIS — M6281 Muscle weakness (generalized): Secondary | ICD-10-CM | POA: Diagnosis not present

## 2023-01-24 DIAGNOSIS — M25661 Stiffness of right knee, not elsewhere classified: Secondary | ICD-10-CM | POA: Diagnosis not present

## 2023-01-28 DIAGNOSIS — M1712 Unilateral primary osteoarthritis, left knee: Secondary | ICD-10-CM | POA: Diagnosis not present

## 2023-03-07 ENCOUNTER — Other Ambulatory Visit: Payer: Self-pay | Admitting: *Deleted

## 2023-03-07 MED ORDER — METFORMIN HCL 1000 MG PO TABS: 1000.0000 mg | ORAL_TABLET | Freq: Every day | ORAL | 1 refills | Status: DC

## 2023-03-20 ENCOUNTER — Encounter: Payer: Self-pay | Admitting: Family Medicine

## 2023-03-20 ENCOUNTER — Ambulatory Visit (INDEPENDENT_AMBULATORY_CARE_PROVIDER_SITE_OTHER): Payer: PPO | Admitting: Family Medicine

## 2023-03-20 VITALS — BP 134/72 | HR 73 | Temp 98.1°F | Resp 18 | Ht 71.0 in | Wt 280.5 lb

## 2023-03-20 DIAGNOSIS — Z8546 Personal history of malignant neoplasm of prostate: Secondary | ICD-10-CM | POA: Diagnosis not present

## 2023-03-20 DIAGNOSIS — E119 Type 2 diabetes mellitus without complications: Secondary | ICD-10-CM | POA: Diagnosis not present

## 2023-03-20 DIAGNOSIS — Z7984 Long term (current) use of oral hypoglycemic drugs: Secondary | ICD-10-CM | POA: Diagnosis not present

## 2023-03-20 DIAGNOSIS — I10 Essential (primary) hypertension: Secondary | ICD-10-CM

## 2023-03-20 DIAGNOSIS — E034 Atrophy of thyroid (acquired): Secondary | ICD-10-CM

## 2023-03-20 LAB — CBC WITH DIFFERENTIAL/PLATELET
Basophils Absolute: 0.1 10*3/uL (ref 0.0–0.1)
Basophils Relative: 1 % (ref 0.0–3.0)
Eosinophils Absolute: 0.1 10*3/uL (ref 0.0–0.7)
Eosinophils Relative: 1.9 % (ref 0.0–5.0)
HCT: 40.4 % (ref 39.0–52.0)
Hemoglobin: 12.9 g/dL — ABNORMAL LOW (ref 13.0–17.0)
Lymphocytes Relative: 19.2 % (ref 12.0–46.0)
Lymphs Abs: 1.1 10*3/uL (ref 0.7–4.0)
MCHC: 31.8 g/dL (ref 30.0–36.0)
MCV: 87.8 fL (ref 78.0–100.0)
Monocytes Absolute: 0.5 10*3/uL (ref 0.1–1.0)
Monocytes Relative: 8.9 % (ref 3.0–12.0)
Neutro Abs: 4 10*3/uL (ref 1.4–7.7)
Neutrophils Relative %: 69 % (ref 43.0–77.0)
Platelets: 185 10*3/uL (ref 150.0–400.0)
RBC: 4.6 Mil/uL (ref 4.22–5.81)
RDW: 14.7 % (ref 11.5–15.5)
WBC: 5.8 10*3/uL (ref 4.0–10.5)

## 2023-03-20 LAB — COMPREHENSIVE METABOLIC PANEL
ALT: 11 U/L (ref 0–53)
AST: 14 U/L (ref 0–37)
Albumin: 4 g/dL (ref 3.5–5.2)
Alkaline Phosphatase: 62 U/L (ref 39–117)
BUN: 17 mg/dL (ref 6–23)
CO2: 26 meq/L (ref 19–32)
Calcium: 9.4 mg/dL (ref 8.4–10.5)
Chloride: 98 meq/L (ref 96–112)
Creatinine, Ser: 0.97 mg/dL (ref 0.40–1.50)
GFR: 79.3 mL/min (ref >60.00)
Glucose, Bld: 107 mg/dL — ABNORMAL HIGH (ref 70–99)
Potassium: 4.4 meq/L (ref 3.5–5.1)
Sodium: 133 meq/L — ABNORMAL LOW (ref 135–145)
Total Bilirubin: 0.4 mg/dL (ref 0.2–1.2)
Total Protein: 6.8 g/dL (ref 6.0–8.3)

## 2023-03-20 LAB — HEMOGLOBIN A1C: Hgb A1c MFr Bld: 5.5 % (ref 4.6–6.5)

## 2023-03-20 NOTE — Assessment & Plan Note (Signed)
Chronic.  Well-controlled.  Continue metformin at 1000 mg twice daily.  Continue working on diet/exercise

## 2023-03-20 NOTE — Assessment & Plan Note (Signed)
chronic. Well controlled. Continue lisinopril 10 mg and hydrochloroTHIAZIDE 12.5 mg

## 2023-03-20 NOTE — Assessment & Plan Note (Signed)
Chronic.  Controlled.  Continue Synthroid 0.2 mg daily

## 2023-03-20 NOTE — Progress Notes (Signed)
Subjective:     Patient ID: Dillon Jones, male    DOB: August 13, 1950, 72 y.o.   MRN: [id number]  Chief Complaint  Patient presents with   Medical Management of Chronic Issues    6 month follow-up on dm, htn Had a piece of toast and coffee with no sugar    HPI DM type 2- Pt is on metformin 1000 mg tablet daily. Sugars are running unchecked. Reports a low carbohydrate diet. Will get back to exercise when next surgery done-L knee.  Gastric bypass in past   HTN - Pt is on hydrochlorothiazide 12.5 mg and lisinopril 10 mg daily.  Bp's running unchecked. No ha/dizziness/cp/palp/edema/cough/sob. Bp at initial check was 150/70. Bp at recheck was 134/72.  Hypothyroidism.  On synthroid 200-doing well.   Health Maintenance Due  Topic Date Due   DTaP/Tdap/Td (1 - Tdap) Never done    Past Medical History:  Diagnosis Date   Anemia    Arthritis    Diabetes mellitus    Has had for 13 years, type 2   Diabetes mellitus-type II 02/06/2012   GERD (gastroesophageal reflux disease)    Occasional - only 1x q65m   H/O umbilical hernia repair-mesh used; done in [place] 02/06/2012   Hypercholesteremia 02/06/2012   Hyperlipidemia    Hypertension    Hypothyroidism    Lap Roux Y Gastric Bypass Dec 2013 06/09/2012   Morbid obesity (HCC)    Morbid obesity with BMI of 55.8 06/05/2012   OSA on CPAP 02/06/2012   Prostate cancer (HCC)    radioactive seeds   Sleep apnea     Past Surgical History:  Procedure Laterality Date   BREATH TEK H PYLORI  04/22/2012   Procedure: BREATH TEK H PYLORI;  Surgeon: Valarie Merino, MD;  Location: [facility];  Service: General;  Laterality: N/A;   GASTRIC ROUX-EN-Y  06/09/2012   Procedure: LAPAROSCOPIC ROUX-EN-Y GASTRIC;  Surgeon: Valarie Merino, MD;  Location: [facility];  Service: General;  Laterality: N/A;   HERNIA REPAIR  1992   umbilical   PROSTATE BIOPSY     RADIOACTIVE SEED IMPLANT N/A 12/09/2018   Procedure: RADIOACTIVE SEED IMPLANT/BRACHYTHERAPY IMPLANT;   Surgeon: Bjorn Pippin, MD;  Location: [facility];  Service: Urology;  Laterality: N/A;   right knee arthroscopy  1999   SPACE OAR INSTILLATION N/A 12/09/2018   Procedure: SPACE OAR INSTILLATION;  Surgeon: Bjorn Pippin, MD;  Location: [facility];  Service: Urology;  Laterality: N/A;   TOTAL KNEE ARTHROPLASTY Right 12/18/2022   Procedure: TOTAL KNEE ARTHROPLASTY;  Surgeon: Teryl Lucy, MD;  Location: [facility];  Service: Orthopedics;  Laterality: Right;   VENTRAL HERNIA REPAIR  06/09/2012   Procedure: LAPAROSCOPIC VENTRAL HERNIA;  Surgeon: Valarie Merino, MD;  Location: [facility];  Service: General;;  primary repair of ventral hernia     Current Outpatient Medications:    CALCIUM PO, Take 500 mg by mouth 3 (three) times daily. , Disp: , Rfl:    Cyanocobalamin (B-12 PO), Take 1 tablet by mouth daily. , Disp: , Rfl:    hydrochlorothiazide (MICROZIDE) 12.5 MG capsule, TAKE 1 CAPSULE BY MOUTH EVERY DAY, Disp: 90 capsule, Rfl: 1   ketoconazole (NIZORAL) 2 % cream, Apply 1 Application topically 2 (two) times daily. (Patient taking differently: Apply 1 Application topically daily as needed for irritation.), Disp: 60 g, Rfl: 2   levothyroxine (SYNTHROID) 200 MCG tablet, TAKE 1 TABLET BY MOUTH EVERY DAY EVERYDAY EXCEPT MONDAY AND FRIDAY. TAKE 2 TIMES DAILY  ON MONDAY AND FRIDAY, Disp: 90 tablet, Rfl: 1   lisinopril (ZESTRIL) 10 MG tablet, TAKE 1 TABLET BY MOUTH EVERY DAY, Disp: 90 tablet, Rfl: 1   metFORMIN (GLUCOPHAGE) 1000 MG tablet, Take 1 tablet (1,000 mg total) by mouth daily with breakfast., Disp: 90 tablet, Rfl: 1   Multiple Vitamin (MULTIVITAMIN WITH MINERALS) TABS, Take 2 tablets by mouth daily. , Disp: , Rfl:    pravastatin (PRAVACHOL) 10 MG tablet, TAKE 1 TABLET BY MOUTH EVERY DAY, Disp: 90 tablet, Rfl: 1  No Known Allergies ROS neg/noncontributory except as noted HPI/below      Objective:     BP 134/72 (BP Location: Left Arm, Patient Position: Sitting)   Pulse 73   Temp 98.1 F (36.7 C)  (Temporal)   Resp 18   Ht 5\' 11"  (1.803 m)   Wt 280 lb 8 oz (127.2 kg)   SpO2 99%   BMI 39.12 kg/m  Wt Readings from Last 3 Encounters:  03/20/23 280 lb 8 oz (127.2 kg)  12/18/22 282 lb (127.9 kg)  12/05/22 282 lb (127.9 kg)   Physical Exam   Gen: [facility] NAD HEENT: NCAT, conjunctiva not injected, sclera nonicteric NECK:  supple, no thyromegaly, no nodes, no carotid bruits CARDIAC: RRR, S1S2+. DP 2+B +2/6 systolic murmur LUNGS: CTAB. No wheezes ABDOMEN:  BS+, soft, NTND, No HSM, no masses umbilical hernia EXT:  no edema MSK: no gross abnormalities.  NEURO: A&O x3.  CN II-XII intact.  PSYCH: normal mood. Good eye contact  Diabetic Foot Exam - Simple   Simple Foot Form Diabetic Foot exam was performed with the following findings: Yes 03/20/2023 12:04 PM  Visual Inspection No deformities, no ulcerations, no other skin breakdown bilaterally: Yes Sensation Testing See comments: Yes Pulse Check Comments Cool feet.  Can feel monofilament R toes but not foot  can feel L foot, toes, x great toe        Assessment & Plan:  Type 2 diabetes mellitus without complication, without long-term current use of insulin (HCC) Assessment & Plan: Chronic.  Well-controlled.  Continue metformin at 1000 mg twice daily.  Continue working on diet/exercise  Orders: -     Comprehensive metabolic panel -     Hemoglobin A1c  Primary hypertension Assessment & Plan: chronic. Well controlled. Continue lisinopril 10 mg and hydrochloroTHIAZIDE 12.5 mg   Orders: -     CBC with Differential/Platelet  Hypothyroidism due to acquired atrophy of thyroid Assessment & Plan: Chronic.  Controlled.  Continue Synthroid 0.2 mg daily   Ok for surgery Return in about 6 months (around 09/18/2023) for HTN, DM.  Melina Fiddler N Rice,acting as a scribe for Angelena Sole, MD.,have documented all relevant documentation on the behalf of Angelena Sole, MD,as directed by  Angelena Sole, MD while in the presence of Angelena Sole,  MD.  I, Angelena Sole, MD, have reviewed all documentation for this visit. The documentation on 03/20/23 for the exam, diagnosis, procedures, and orders are all accurate and complete.   Angelena Sole, MD

## 2023-03-20 NOTE — Patient Instructions (Signed)

## 2023-03-21 NOTE — Progress Notes (Signed)
Labs great.  Hgb better(had surgery) A1C awesome!  Can decrease metformin to once/day if wants to

## 2023-04-03 NOTE — Progress Notes (Signed)
Surgery orders requested via Epic inbox. °

## 2023-04-08 NOTE — H&P (Signed)
PREOPERATIVE H&P  Chief Complaint: Left knee pain  HPI: [facility] is a 72 y.o. male who presents for preoperative history and physical with a diagnosis of left knee arthritis.  He has had persistent severe chronic left knee pain.  He has failed injections.  He cannot take anti-inflammatories, past history of gastric bypass.  He is very happy with the right side and he wants his left one done. His left knee pain is significantly impairing activities of daily living.  He has elected for surgical management.   Past Medical History:  Diagnosis Date   Anemia    Arthritis    Diabetes mellitus    Has had for 13 years, type 2   Diabetes mellitus-type II 02/06/2012   GERD (gastroesophageal reflux disease)    Occasional - only 1x q71m   H/O umbilical hernia repair-mesh used; done in [place] 02/06/2012   Hypercholesteremia 02/06/2012   Hyperlipidemia    Hypertension    Hypothyroidism    Lap Roux Y Gastric Bypass Dec 2013 06/09/2012   Morbid obesity (HCC)    Morbid obesity with BMI of 55.8 06/05/2012   OSA on CPAP 02/06/2012   Prostate cancer (HCC)    radioactive seeds   Sleep apnea    Past Surgical History:  Procedure Laterality Date   BREATH TEK H PYLORI  04/22/2012   Procedure: BREATH TEK H PYLORI;  Surgeon: Valarie Merino, MD;  Location: [facility];  Service: General;  Laterality: N/A;   GASTRIC ROUX-EN-Y  06/09/2012   Procedure: LAPAROSCOPIC ROUX-EN-Y GASTRIC;  Surgeon: Valarie Merino, MD;  Location: [facility];  Service: General;  Laterality: N/A;   HERNIA REPAIR  1992   umbilical   PROSTATE BIOPSY     RADIOACTIVE SEED IMPLANT N/A 12/09/2018   Procedure: RADIOACTIVE SEED IMPLANT/BRACHYTHERAPY IMPLANT;  Surgeon: Bjorn Pippin, MD;  Location: [facility];  Service: Urology;  Laterality: N/A;   right knee arthroscopy  1999   SPACE OAR INSTILLATION N/A 12/09/2018   Procedure: SPACE OAR INSTILLATION;  Surgeon: Bjorn Pippin, MD;  Location: [facility];  Service: Urology;  Laterality: N/A;    TOTAL KNEE ARTHROPLASTY Right 12/18/2022   Procedure: TOTAL KNEE ARTHROPLASTY;  Surgeon: Teryl Lucy, MD;  Location: [facility];  Service: Orthopedics;  Laterality: Right;   VENTRAL HERNIA REPAIR  06/09/2012   Procedure: LAPAROSCOPIC VENTRAL HERNIA;  Surgeon: Valarie Merino, MD;  Location: [facility];  Service: General;;  primary repair of ventral hernia   Social History   Socioeconomic History   Marital status: Married    Spouse name: Not on file   Number of children: 3   Years of education: Not on file   Highest education level: Doctorate  Occupational History    Comment: working full time  Tobacco Use   Smoking status: Never   Smokeless tobacco: Never  Vaping Use   Vaping status: Never Used  Substance and Sexual Activity   Alcohol use: Yes    Alcohol/week: 10.0 standard drinks of alcohol    Types: 2 Glasses of wine, 8 Shots of liquor per week    Comment: 2 shots sev times/wk   Drug use: No   Sexual activity: Not Currently    Birth control/protection: Abstinence  Other Topics Concern   Not on file  Social History Narrative   Had 3 children but 1 passed away.  No grands   Retired Nurse, adult   Social Determinants of Health   Financial Resource Strain: Low Risk  (03/16/2023)  Overall Financial Resource Strain (CARDIA)    Difficulty of Paying Living Expenses: Not hard at all  Food Insecurity: No Food Insecurity (03/16/2023)   Hunger Vital Sign    Worried About Running Out of Food in the Last Year: Never true    Ran Out of Food in the Last Year: Never true  Transportation Needs: No Transportation Needs (03/16/2023)   PRAPARE - Administrator, Civil Service (Medical): No    Lack of Transportation (Non-Medical): No  Physical Activity: Sufficiently Active (03/16/2023)   Exercise Vital Sign    Days of Exercise per Week: 5 days    Minutes of Exercise per Session: 30 min  Stress: No Stress Concern Present (03/16/2023)   [employer] of Occupational Health  - Occupational Stress Questionnaire    Feeling of Stress : Not at all  Social Connections: Moderately Integrated (03/16/2023)   Social Connection and Isolation Panel [NHANES]    Frequency of Communication with Friends and Family: Once a week    Frequency of Social Gatherings with Friends and Family: Once a week    Attends Religious Services: More than 4 times per year    Active Member of [employer] or Organizations: Yes    Attends Engineer, structural: More than 4 times per year    Marital Status: Married   Family History  Problem Relation Age of Onset   Breast cancer Mother    Vision loss Mother    Varicose Veins Mother    Heart disease Father    Diabetes Father    Hypertension Father    Obesity Father    Varicose Veins Father    Diabetes Brother        2 brothers   Hypertension Brother    Obesity Brother    Diabetes Maternal Grandmother    Obesity Brother    Stroke Brother    Prostate cancer Neg Hx    Colon cancer Neg Hx    Pancreatic cancer Neg Hx    No Known Allergies Prior to Admission medications   Medication Sig Start Date End Date Taking? Authorizing Provider  CALCIUM PO Take 1 tablet by mouth daily.   Yes [provider]  hydrochlorothiazide (MICROZIDE) 12.5 MG capsule TAKE 1 CAPSULE BY MOUTH EVERY DAY 12/18/22  Yes Jeani Sow, MD  ketoconazole (NIZORAL) 2 % cream Apply 1 Application topically 2 (two) times daily. Patient taking differently: Apply 1 Application topically daily as needed for irritation. 09/27/22  Yes Jeani Sow, MD  levothyroxine (SYNTHROID) 200 MCG tablet TAKE 1 TABLET BY MOUTH EVERY DAY EVERYDAY EXCEPT MONDAY AND FRIDAY. TAKE 2 TIMES DAILY ON MONDAY AND FRIDAY 12/18/22  Yes Jeani Sow, MD  lisinopril (ZESTRIL) 10 MG tablet TAKE 1 TABLET BY MOUTH EVERY DAY 12/18/22  Yes Jeani Sow, MD  metFORMIN (GLUCOPHAGE) 1000 MG tablet Take 1 tablet (1,000 mg total) by mouth daily with breakfast. 03/07/23  Yes Jeani Sow, MD   Multiple Vitamin (MULTIVITAMIN WITH MINERALS) TABS Take 2 tablets by mouth daily.    Yes [provider]  pravastatin (PRAVACHOL) 10 MG tablet TAKE 1 TABLET BY MOUTH EVERY DAY 12/18/22  Yes Jeani Sow, MD     Positive ROS: All other systems have been reviewed and were otherwise negative with the exception of those mentioned in the HPI and as above.  Physical Exam: General: Alert, no acute distress Cardiovascular: No pedal edema Respiratory: No cyanosis, no use of accessory musculature GI: No organomegaly,  abdomen is soft and non-tender Skin: No lesions in the area of chief complaint Neurologic: Sensation intact distally Psychiatric: Patient is competent for consent with normal mood and affect Lymphatic: No axillary or cervical lymphadenopathy  MUSCULOSKELETAL: On exam today left knee has significant crepitus with varus alignment.  Range of motion 10-110 degrees.    X-rays demonstrate severe incongruence of the left knee with osteoarthritic changes.    Assessment: Left knee osteoarthritis   Plan: Plan for Procedure(s): TOTAL KNEE ARTHROPLASTY  The risks benefits and alternatives were discussed with the patient including but not limited to the risks of nonoperative treatment, versus surgical intervention including infection, bleeding, nerve injury,  blood clots, cardiopulmonary complications, morbidity, mortality, among others, and they were willing to proceed.   Armida Sans, PA-C    04/08/2023 3:46 PM

## 2023-04-09 NOTE — Patient Instructions (Signed)
SURGICAL WAITING ROOM VISITATION  Patients having surgery or a procedure may have no more than 2 support people in the waiting area - these visitors may rotate.    Children under the age of 17 must have an adult with them who is not the patient.  Due to an increase in RSV and influenza rates and associated hospitalizations, children ages 26 and under may not visit patients in [facility] hospitals.  If the patient needs to stay at the hospital during part of their recovery, the visitor guidelines for inpatient rooms apply. Pre-op nurse will coordinate an appropriate time for 1 support person to accompany patient in pre-op.  This support person may not rotate.    Please refer to the [facility] website for the visitor guidelines for Inpatients (after your surgery is over and you are in a regular room).       Your procedure is scheduled on: 04/23/23   Report to [facility] Main Entrance    Report to admitting at 11 AM   Call this number if you have problems the morning of surgery [phone]   Do not eat food :After Midnight.   After Midnight you may have the following liquids until 10:30 AM DAY OF SURGERY  Water Non-Citrus Juices (without pulp, NO RED-Apple, White grape, White cranberry) Black Coffee (NO MILK/CREAM OR CREAMERS, sugar ok)  Clear Tea (NO MILK/CREAM OR CREAMERS, sugar ok) regular and decaf                             Plain Jell-O (NO RED)                                           Fruit ices (not with fruit pulp, NO RED)                                     Popsicles (NO RED)                                                               Sports drinks like Gatorade (NO RED)                The day of surgery:  Drink ONE (1) Pre-Surgery  G2 at 10:30 AM the morning of surgery. Drink in one sitting. Do not sip.  This drink was given to you during your hospital  pre-op appointment visit. Nothing else to drink after completing the  Pre-Surgery G2.     Oral  Hygiene is also important to reduce your risk of infection.                                    Remember - BRUSH YOUR TEETH THE MORNING OF SURGERY WITH YOUR REGULAR TOOTHPASTE  DENTURES WILL BE REMOVED PRIOR TO SURGERY PLEASE DO NOT APPLY "Poly grip" OR ADHESIVES!!!   Stop all vitamins and herbal supplements 7 days before surgery.   Take these medicines the morning of surgery  with A SIP OF WATER: Levothyroxine, pravastatin  DO NOT TAKE ANY ORAL DIABETIC MEDICATIONS DAY OF YOUR SURGERY;HOLD METFORMIN THE DAY OF SURGERY.  Bring CPAP mask and tubing day of surgery.                              You may not have any metal on your body including hair pins, jewelry, and body piercing             Do not wear make-up, lotions, powders, perfumes/cologne, or deodorant              Men may shave face and neck.   Do not bring valuables to the hospital. [facility] IS NOT             RESPONSIBLE   FOR VALUABLES.   Contacts, glasses, dentures or bridgework may not be worn into surgery.   Bring small overnight bag day of surgery.   DO NOT BRING YOUR HOME MEDICATIONS TO THE HOSPITAL. PHARMACY WILL DISPENSE MEDICATIONS LISTED ON YOUR MEDICATION LIST TO YOU DURING YOUR ADMISSION IN THE HOSPITAL!    Patients discharged on the day of surgery will not be allowed to drive home.  Someone NEEDS to stay with you for the first 24 hours after anesthesia.   Special Instructions: Bring a copy of your healthcare power of attorney and living will documents the day of surgery if you haven't scanned them before.              Please read over the following fact sheets you were given: IF YOU HAVE QUESTIONS ABOUT YOUR PRE-OP INSTRUCTIONS PLEASE CALL [phone] [facility]   If you received a COVID test during your pre-op visit  it is requested that you wear a mask when out in public, stay away from anyone that may not be feeling well and notify your surgeon if you develop symptoms. If you test positive for Covid or have  been in contact with anyone that has tested positive in the last 10 days please notify you surgeon.      Pre-operative 5 CHG [facility] Instructions   You can play a key role in reducing the risk of infection after surgery. Your skin needs to be as free of germs as possible. You can reduce the number of germs on your skin by washing with CHG (chlorhexidine gluconate) soap before surgery. CHG is an antiseptic soap that kills germs and continues to kill germs even after washing.   DO NOT use if you have an allergy to chlorhexidine/CHG or antibacterial soaps. If your skin becomes reddened or irritated, stop using the CHG and notify one of our RNs at [phone].   Please shower with the CHG soap starting 4 days before surgery using the following schedule:     Please keep in mind the following:  DO NOT shave, including legs and underarms, starting the day of your first shower.   You may shave your face at any point before/day of surgery.  Place clean sheets on your bed the day you start using CHG soap. Use a clean washcloth (not used since being washed) for each shower. DO NOT sleep with pets once you start using the CHG.   CHG Shower Instructions:  If you choose to wash your hair and private area, wash first with your normal shampoo/soap.  After you use shampoo/soap, rinse your hair and body thoroughly to remove shampoo/soap residue.  Turn the water  OFF and apply about 3 tablespoons (45 ml) of CHG soap to a CLEAN washcloth.  Apply CHG soap ONLY FROM YOUR NECK DOWN TO YOUR TOES (washing for 3-5 minutes)  DO NOT use CHG soap on face, private areas, open wounds, or sores.  Pay special attention to the area where your surgery is being performed.  If you are having back surgery, having someone wash your back for you may be helpful. Wait 2 minutes after CHG soap is applied, then you may rinse off the CHG soap.  Pat dry with a clean towel  Put on clean clothes/pajamas   If you choose to wear lotion,  please use ONLY the CHG-compatible lotions on the back of this paper.     Additional instructions for the day of surgery: DO NOT APPLY any lotions, deodorants, cologne, or perfumes.   Put on clean/comfortable clothes.  Brush your teeth.  Ask your nurse before applying any prescription medications to the skin.      CHG Compatible Lotions   Aveeno Moisturizing lotion  Cetaphil Moisturizing Cream  Cetaphil Moisturizing Lotion  Clairol Herbal Essence Moisturizing Lotion, Dry Skin  Clairol Herbal Essence Moisturizing Lotion, Extra Dry Skin  Clairol Herbal Essence Moisturizing Lotion, Normal Skin  Curel Age Defying Therapeutic Moisturizing Lotion with Alpha Hydroxy  Curel Extreme Care Body Lotion  Curel Soothing Hands Moisturizing Hand Lotion  Curel Therapeutic Moisturizing Cream, Fragrance-Free  Curel Therapeutic Moisturizing Lotion, Fragrance-Free  Curel Therapeutic Moisturizing Lotion, Original Formula  Eucerin Daily Replenishing Lotion  Eucerin Dry Skin Therapy Plus Alpha Hydroxy Crme  Eucerin Dry Skin Therapy Plus Alpha Hydroxy Lotion  Eucerin Original Crme  Eucerin Original Lotion  Eucerin Plus Crme Eucerin Plus Lotion  Eucerin TriLipid Replenishing Lotion  Keri Anti-Bacterial Hand Lotion  Keri Deep Conditioning Original Lotion Dry Skin Formula Softly Scented  Keri Deep Conditioning Original Lotion, Fragrance Free Sensitive Skin Formula  Keri Lotion Fast Absorbing Fragrance Free Sensitive Skin Formula  Keri Lotion Fast Absorbing Softly Scented Dry Skin Formula  Keri Original Lotion  Keri Skin Renewal Lotion Keri Silky Smooth Lotion  Keri Silky Smooth Sensitive Skin Lotion  Nivea Body Creamy Conditioning Oil  Nivea Body Extra Enriched Lotion  Nivea Body Original Lotion  Nivea Body Sheer Moisturizing Lotion Nivea Crme  Nivea Skin Firming Lotion  NutraDerm 30 Skin Lotion  NutraDerm Skin Lotion  NutraDerm Therapeutic Skin Cream  NutraDerm Therapeutic Skin Lotion   ProShield Protective Hand Cream  Incentive Spirometer (Watch this video at home: [email or web address])  An incentive spirometer is a tool that can help keep your lungs clear and active. This tool measures how well you are filling your lungs with each breath. Taking long deep breaths may help reverse or decrease the chance of developing breathing (pulmonary) problems (especially infection) following: A long period of time when you are unable to move or be active. BEFORE THE PROCEDURE  If the spirometer includes an indicator to show your best effort, your nurse or respiratory therapist will set it to a desired goal. If possible, sit up straight or lean slightly forward. Try not to slouch. Hold the incentive spirometer in an upright position. INSTRUCTIONS FOR USE  Sit on the edge of your bed if possible, or sit up as far as you can in bed or on a chair. Hold the incentive spirometer in an upright position. Breathe out normally. Place the mouthpiece in your mouth and seal your lips tightly around it. Breathe in slowly and as deeply as possible, raising the piston  or the ball toward the top of the column. Hold your breath for 3-5 seconds or for as long as possible. Allow the piston or ball to fall to the bottom of the column. Remove the mouthpiece from your mouth and breathe out normally. Rest for a few seconds and repeat Steps 1 through 7 at least 10 times every 1-2 hours when you are awake. Take your time and take a few normal breaths between deep breaths. The spirometer may include an indicator to show your best effort. Use the indicator as a goal to work toward during each repetition. After each set of 10 deep breaths, practice coughing to be sure your lungs are clear. If you have an incision (the cut made at the time of surgery), support your incision when coughing by placing a pillow or rolled up towels firmly against it. Once you are able to get out of bed, walk around  indoors and cough well. You may stop using the incentive spirometer when instructed by your caregiver.  RISKS AND COMPLICATIONS Take your time so you do not get dizzy or light-headed. If you are in pain, you may need to take or ask for pain medication before doing incentive spirometry. It is harder to take a deep breath if you are having pain. AFTER USE Rest and breathe slowly and easily. It can be helpful to keep track of a log of your progress. Your caregiver can provide you with a simple table to help with this. If you are using the spirometer at home, follow these instructions: SEEK MEDICAL CARE IF:  You are having difficultly using the spirometer. You have trouble using the spirometer as often as instructed. Your pain medication is not giving enough relief while using the spirometer. You develop fever of 100.5 F (38.1 C) or higher. SEEK IMMEDIATE MEDICAL CARE IF:  You cough up bloody sputum that had not been present before. You develop fever of 102 F (38.9 C) or greater. You develop worsening pain at or near the incision site. MAKE SURE YOU:  Understand these instructions. Will watch your condition. Will get help right away if you are not doing well or get worse. Document Released: 10/08/2006 Document Revised: 08/20/2011 Document Reviewed: 12/09/2006 [facility] Patient Information 2014 [place], [place].

## 2023-04-09 NOTE — Progress Notes (Signed)
COVID Vaccine received:  []  No [x]  Yes Date of any COVID positive Test in last 90 days: no PCP - Jeani Sow MD Cardiologist - Laurance Flatten MD  Chest x-ray -  EKG -  09/13/22 Epic Stress Test - 09/25/22 Epic ECHO - 10/11/22 Epic Cardiac Cath -   Medical clearance Dr. Rondel Jumbo 03/20/23  Bowel Prep - [x]  No  []   Yes ______  Pacemaker / ICD device [x]  No []  Yes   Spinal Cord Stimulator:[x]  No []  Yes       History of Sleep Apnea? []  No [x]  Yes   CPAP used?- []  No [x]  Yes    Does the patient monitor blood sugar?          [x]  No []  Yes  []  N/A  Patient has: []  NO Hx DM   []  Pre-DM                 []  DM1  [x]   DM2 Does patient have a [employer] or Dexacom? []  No []  Yes   Fasting Blood Sugar Ranges-  Checks Blood Sugar _____ times a day  GLP1 agonist / usual dose - no GLP1 instructions:  SGLT-2 inhibitors / usual dose - no SGLT-2 instructions:   Blood Thinner / Instructions:no Aspirin Instructions:no  Comments:   Activity level: Patient is able to climb a flight of stairs without difficulty; [x]  No CP  [x]  No SOB,  ___   Patient can perform ADLs without assistance.   Anesthesia review:   Patient denies shortness of breath, fever, cough and chest pain at PAT appointment.  Patient verbalized understanding and agreement to the Pre-Surgical Instructions that were given to them at this PAT appointment. Patient was also educated of the need to review these PAT instructions again prior to his/her surgery.I reviewed the appropriate phone numbers to call if they have any and questions or concerns.

## 2023-04-10 ENCOUNTER — Encounter (HOSPITAL_COMMUNITY): Payer: Self-pay

## 2023-04-10 ENCOUNTER — Other Ambulatory Visit: Payer: Self-pay

## 2023-04-10 ENCOUNTER — Encounter (HOSPITAL_COMMUNITY)
Admission: RE | Admit: 2023-04-10 | Discharge: 2023-04-10 | Disposition: A | Discharge status: Home / Self Care | Payer: PPO | Source: Ambulatory Visit | Attending: Orthopedic Surgery | Admitting: Orthopedic Surgery

## 2023-04-10 VITALS — BP 172/59 | HR 56 | Temp 98.3°F | Resp 16 | Ht 71.0 in | Wt 278.0 lb

## 2023-04-10 DIAGNOSIS — E119 Type 2 diabetes mellitus without complications: Secondary | ICD-10-CM

## 2023-04-10 DIAGNOSIS — Z01818 Encounter for other preprocedural examination: Secondary | ICD-10-CM

## 2023-04-10 DIAGNOSIS — Z01812 Encounter for preprocedural laboratory examination: Secondary | ICD-10-CM | POA: Insufficient documentation

## 2023-04-10 DIAGNOSIS — I10 Essential (primary) hypertension: Secondary | ICD-10-CM | POA: Diagnosis not present

## 2023-04-10 HISTORY — DX: Myoneural disorder, unspecified: G70.9

## 2023-04-10 LAB — BASIC METABOLIC PANEL WITH GFR
Anion gap: 10 (ref 5–15)
BUN: 21 mg/dL (ref 8–23)
CO2: 23 mmol/L (ref 22–32)
Calcium: 9.1 mg/dL (ref 8.9–10.3)
Chloride: 102 mmol/L (ref 98–111)
Creatinine, Ser: 1.17 mg/dL (ref 0.61–1.24)
GFR, Estimated: >60 mL/min
Glucose, Bld: 99 mg/dL (ref 70–99)
Potassium: 4.6 mmol/L (ref 3.5–5.1)
Sodium: 135 mmol/L (ref 135–145)

## 2023-04-10 LAB — CBC
HCT: 41.5 % (ref 39.0–52.0)
Hemoglobin: 12.7 g/dL — ABNORMAL LOW (ref 13.0–17.0)
MCH: 27.2 pg (ref 26.0–34.0)
MCHC: 30.6 g/dL (ref 30.0–36.0)
MCV: 88.9 fL (ref 80.0–100.0)
Platelets: 190 10*3/uL (ref 150–400)
RBC: 4.67 MIL/uL (ref 4.22–5.81)
RDW: 14 % (ref 11.5–15.5)
WBC: 6.5 10*3/uL (ref 4.0–10.5)
nRBC: 0 % (ref 0.0–0.2)

## 2023-04-10 LAB — SURGICAL PCR SCREEN
MRSA, PCR: NEGATIVE
Staphylococcus aureus: NEGATIVE

## 2023-04-15 NOTE — Care Plan (Signed)
Ortho Bundle Case Management Note  Patient Details  Name: [facility] MRN: [id number] Date of Birth: 1950-11-13  spoke with patient on phone. will discharge to home with family to assist. has DME. HHPT referral to [facility]. OPPT set up with [facility]. discharge instructions discussed and mailed to patient. he will call with any questions or needs. Patient and MD in agreement with plan. Choice offered                      DME Arranged:    DME Agency:     HH Arranged:  PT HH Agency:  [facility]  Additional Comments: Please contact me with any questions of if this plan should need to change.  Shauna Hugh,  RN,BSN,MHA,CCM  [facility]  [phone] 04/15/2023, 12:55 PM

## 2023-04-18 NOTE — Discharge Instructions (Signed)

## 2023-04-23 ENCOUNTER — Ambulatory Visit (HOSPITAL_COMMUNITY): Payer: Self-pay | Admitting: Anesthesiology

## 2023-04-23 ENCOUNTER — Encounter (HOSPITAL_COMMUNITY)
Admission: RE | Disposition: A | Discharge status: Home / Self Care | Payer: Self-pay | Source: Ambulatory Visit | Attending: Orthopedic Surgery

## 2023-04-23 ENCOUNTER — Other Ambulatory Visit: Payer: Self-pay

## 2023-04-23 ENCOUNTER — Observation Stay (HOSPITAL_COMMUNITY): Payer: PPO

## 2023-04-23 ENCOUNTER — Observation Stay (HOSPITAL_COMMUNITY)
Admission: RE | Admit: 2023-04-23 | Discharge: 2023-04-24 | Disposition: A | Discharge status: Home / Self Care | Payer: PPO | Source: Ambulatory Visit | Attending: Orthopedic Surgery | Admitting: Orthopedic Surgery

## 2023-04-23 ENCOUNTER — Encounter (HOSPITAL_COMMUNITY): Payer: Self-pay | Admitting: Orthopedic Surgery

## 2023-04-23 DIAGNOSIS — Z96652 Presence of left artificial knee joint: Secondary | ICD-10-CM | POA: Diagnosis not present

## 2023-04-23 DIAGNOSIS — Z471 Aftercare following joint replacement surgery: Secondary | ICD-10-CM | POA: Diagnosis not present

## 2023-04-23 DIAGNOSIS — M25562 Pain in left knee: Secondary | ICD-10-CM | POA: Diagnosis present

## 2023-04-23 DIAGNOSIS — F1090 Alcohol use, unspecified, uncomplicated: Secondary | ICD-10-CM | POA: Insufficient documentation

## 2023-04-23 DIAGNOSIS — M1712 Unilateral primary osteoarthritis, left knee: Secondary | ICD-10-CM | POA: Diagnosis not present

## 2023-04-23 DIAGNOSIS — N4883 Acquired buried penis: Secondary | ICD-10-CM | POA: Diagnosis not present

## 2023-04-23 DIAGNOSIS — E039 Hypothyroidism, unspecified: Secondary | ICD-10-CM | POA: Diagnosis not present

## 2023-04-23 DIAGNOSIS — K219 Gastro-esophageal reflux disease without esophagitis: Secondary | ICD-10-CM | POA: Insufficient documentation

## 2023-04-23 DIAGNOSIS — E119 Type 2 diabetes mellitus without complications: Secondary | ICD-10-CM | POA: Diagnosis not present

## 2023-04-23 DIAGNOSIS — R339 Retention of urine, unspecified: Secondary | ICD-10-CM | POA: Diagnosis not present

## 2023-04-23 DIAGNOSIS — I10 Essential (primary) hypertension: Secondary | ICD-10-CM | POA: Diagnosis not present

## 2023-04-23 DIAGNOSIS — R609 Edema, unspecified: Secondary | ICD-10-CM | POA: Diagnosis not present

## 2023-04-23 DIAGNOSIS — Z79899 Other long term (current) drug therapy: Secondary | ICD-10-CM | POA: Insufficient documentation

## 2023-04-23 DIAGNOSIS — Z7984 Long term (current) use of oral hypoglycemic drugs: Secondary | ICD-10-CM | POA: Diagnosis not present

## 2023-04-23 DIAGNOSIS — G8918 Other acute postprocedural pain: Secondary | ICD-10-CM | POA: Diagnosis not present

## 2023-04-23 HISTORY — PX: TOTAL KNEE ARTHROPLASTY: SHX125

## 2023-04-23 LAB — GLUCOSE, CAPILLARY
Glucose-Capillary: 93 mg/dL (ref 70–99)
Glucose-Capillary: 83 mg/dL (ref 70–99)
Glucose-Capillary: 118 mg/dL — ABNORMAL HIGH (ref 70–99)

## 2023-04-23 SURGERY — ARTHROPLASTY, KNEE, TOTAL
Anesthesia: General | Site: Knee | Laterality: Left

## 2023-04-23 MED ORDER — HYDROCHLOROTHIAZIDE 12.5 MG PO CAPS
12.5000 mg | ORAL_CAPSULE | Freq: Every day | ORAL | Status: DC
  Filled 2023-04-23: qty 1

## 2023-04-23 MED ORDER — TRANEXAMIC ACID-NACL 1000-0.7 MG/100ML-% IV SOLN
1000.0000 mg | INTRAVENOUS | Status: AC
  Administered 2023-04-23: 1000 mg via INTRAVENOUS
  Filled 2023-04-23: qty 100

## 2023-04-23 MED ORDER — DEXAMETHASONE SODIUM PHOSPHATE 10 MG/ML IJ SOLN
INTRAMUSCULAR | Status: AC
  Filled 2023-04-23: qty 1

## 2023-04-23 MED ORDER — LISINOPRIL 10 MG PO TABS
10.0000 mg | ORAL_TABLET | Freq: Every day | ORAL | Status: DC
  Administered 2023-04-24: 10 mg via ORAL
  Filled 2023-04-23: qty 1

## 2023-04-23 MED ORDER — TAMSULOSIN HCL 0.4 MG PO CAPS
0.4000 mg | ORAL_CAPSULE | Freq: Every day | ORAL | Status: DC
  Administered 2023-04-23: 0.4 mg via ORAL
  Filled 2023-04-23 (×2): qty 1

## 2023-04-23 MED ORDER — ROCURONIUM BROMIDE 10 MG/ML (PF) SYRINGE
PREFILLED_SYRINGE | INTRAVENOUS | Status: AC
  Filled 2023-04-23: qty 10

## 2023-04-23 MED ORDER — CEFAZOLIN IN SODIUM CHLORIDE 3-0.9 GM/100ML-% IV SOLN
3.0000 g | INTRAVENOUS | Status: AC
  Administered 2023-04-23: 3 g via INTRAVENOUS
  Filled 2023-04-23: qty 100

## 2023-04-23 MED ORDER — FENTANYL CITRATE (PF) 100 MCG/2ML IJ SOLN
INTRAMUSCULAR | Status: AC
  Filled 2023-04-23: qty 2

## 2023-04-23 MED ORDER — ONDANSETRON HCL 4 MG/2ML IJ SOLN
INTRAMUSCULAR | Status: AC
  Filled 2023-04-23: qty 2

## 2023-04-23 MED ORDER — ONDANSETRON HCL 4 MG/2ML IJ SOLN
INTRAMUSCULAR | Status: DC | PRN
  Administered 2023-04-23: 4 mg via INTRAVENOUS

## 2023-04-23 MED ORDER — ONDANSETRON HCL 4 MG/2ML IJ SOLN: 4.0000 mg | Freq: Once | INTRAMUSCULAR | Status: DC | PRN

## 2023-04-23 MED ORDER — PROPOFOL 1000 MG/100ML IV EMUL
INTRAVENOUS | Status: AC
  Filled 2023-04-23: qty 100

## 2023-04-23 MED ORDER — ASPIRIN 325 MG PO TBEC
325.0000 mg | DELAYED_RELEASE_TABLET | Freq: Two times a day (BID) | ORAL | Status: DC
  Administered 2023-04-24: 325 mg via ORAL
  Filled 2023-04-23: qty 1

## 2023-04-23 MED ORDER — FENTANYL CITRATE PF 50 MCG/ML IJ SOSY
PREFILLED_SYRINGE | INTRAMUSCULAR | Status: AC
  Administered 2023-04-23: 100 ug
  Filled 2023-04-23: qty 2

## 2023-04-23 MED ORDER — OXYCODONE HCL 5 MG/5ML PO SOLN: 5.0000 mg | Freq: Once | ORAL | Status: DC | PRN

## 2023-04-23 MED ORDER — MEPERIDINE HCL 50 MG/ML IJ SOLN: 6.2500 mg | INTRAMUSCULAR | Status: DC | PRN

## 2023-04-23 MED ORDER — PROPOFOL 10 MG/ML IV BOLUS
INTRAVENOUS | Status: DC | PRN
  Administered 2023-04-23: 60 mg via INTRAVENOUS
  Administered 2023-04-23: 140 mg via INTRAVENOUS

## 2023-04-23 MED ORDER — METFORMIN HCL 500 MG PO TABS
1000.0000 mg | ORAL_TABLET | Freq: Every day | ORAL | Status: DC
  Administered 2023-04-24: 1000 mg via ORAL
  Filled 2023-04-23 (×2): qty 2

## 2023-04-23 MED ORDER — MIDAZOLAM HCL 2 MG/2ML IJ SOLN
INTRAMUSCULAR | Status: AC
  Administered 2023-04-23: 2 mg
  Filled 2023-04-23: qty 2

## 2023-04-23 MED ORDER — PHENYLEPHRINE HCL-NACL 20-0.9 MG/250ML-% IV SOLN
INTRAVENOUS | Status: DC | PRN
  Administered 2023-04-23: 25 ug/min via INTRAVENOUS

## 2023-04-23 MED ORDER — HYDROMORPHONE HCL 1 MG/ML IJ SOLN
INTRAMUSCULAR | Status: AC
  Filled 2023-04-23: qty 1

## 2023-04-23 MED ORDER — METHOCARBAMOL 500 MG PO TABS
500.0000 mg | ORAL_TABLET | Freq: Four times a day (QID) | ORAL | Status: DC | PRN
  Administered 2023-04-23 – 2023-04-24 (×2): 500 mg via ORAL
  Filled 2023-04-23 (×2): qty 1

## 2023-04-23 MED ORDER — OXYCODONE HCL 5 MG PO TABS
10.0000 mg | ORAL_TABLET | ORAL | Status: DC | PRN
Start: 2023-04-23 — End: 2023-04-24

## 2023-04-23 MED ORDER — LACTATED RINGERS IV SOLN: INTRAVENOUS | Status: DC

## 2023-04-23 MED ORDER — ROCURONIUM BROMIDE 100 MG/10ML IV SOLN
INTRAVENOUS | Status: DC | PRN
  Administered 2023-04-23 (×2): 20 mg via INTRAVENOUS
  Administered 2023-04-23: 50 mg via INTRAVENOUS
  Administered 2023-04-23: 10 mg via INTRAVENOUS

## 2023-04-23 MED ORDER — METHOCARBAMOL 500 MG PO TABS
ORAL_TABLET | ORAL | Status: AC
  Filled 2023-04-23: qty 1

## 2023-04-23 MED ORDER — PHENOL 1.4 % MT LIQD: 1.0000 | OROMUCOSAL | Status: DC | PRN

## 2023-04-23 MED ORDER — WATER FOR IRRIGATION, STERILE IR SOLN
Status: DC | PRN
  Administered 2023-04-23: 1000 mL

## 2023-04-23 MED ORDER — LIDOCAINE HCL (PF) 2 % IJ SOLN
INTRAMUSCULAR | Status: AC
  Filled 2023-04-23: qty 5

## 2023-04-23 MED ORDER — SUGAMMADEX SODIUM 200 MG/2ML IV SOLN
INTRAVENOUS | Status: DC | PRN
  Administered 2023-04-23: 200 mg via INTRAVENOUS

## 2023-04-23 MED ORDER — KETOROLAC TROMETHAMINE 30 MG/ML IJ SOLN
INTRAMUSCULAR | Status: AC
  Filled 2023-04-23: qty 1

## 2023-04-23 MED ORDER — PROPOFOL 10 MG/ML IV BOLUS
INTRAVENOUS | Status: AC
  Filled 2023-04-23: qty 20

## 2023-04-23 MED ORDER — LEVOTHYROXINE SODIUM 100 MCG PO TABS
200.0000 ug | ORAL_TABLET | Freq: Every day | ORAL | Status: DC
  Administered 2023-04-24: 200 ug via ORAL
  Filled 2023-04-23: qty 2

## 2023-04-23 MED ORDER — INSULIN ASPART 100 UNIT/ML IJ SOLN: 0.0000 [IU] | INTRAMUSCULAR | Status: DC | PRN

## 2023-04-23 MED ORDER — 0.9 % SODIUM CHLORIDE (POUR BTL) OPTIME
TOPICAL | Status: DC | PRN
  Administered 2023-04-23: 1000 mL

## 2023-04-23 MED ORDER — DOCUSATE SODIUM 100 MG PO CAPS
100.0000 mg | ORAL_CAPSULE | Freq: Two times a day (BID) | ORAL | Status: DC
  Administered 2023-04-23 – 2023-04-24 (×2): 100 mg via ORAL
  Filled 2023-04-23 (×2): qty 1

## 2023-04-23 MED ORDER — PROPOFOL 500 MG/50ML IV EMUL
INTRAVENOUS | Status: DC | PRN
Start: 2023-04-23 — End: 2023-04-23
  Administered 2023-04-23: 55 ug/kg/min via INTRAVENOUS

## 2023-04-23 MED ORDER — POVIDONE-IODINE 7.5 % EX SOLN: Freq: Once | CUTANEOUS | Status: DC

## 2023-04-23 MED ORDER — MENTHOL 3 MG MT LOZG: 1.0000 | LOZENGE | OROMUCOSAL | Status: DC | PRN

## 2023-04-23 MED ORDER — ZOLPIDEM TARTRATE 5 MG PO TABS: 5.0000 mg | ORAL_TABLET | Freq: Every evening | ORAL | Status: DC | PRN

## 2023-04-23 MED ORDER — POVIDONE-IODINE 10 % EX SWAB: 2.0000 | Freq: Once | CUTANEOUS | Status: DC

## 2023-04-23 MED ORDER — METHOCARBAMOL 1000 MG/10ML IJ SOLN: 500.0000 mg | Freq: Four times a day (QID) | INTRAMUSCULAR | Status: DC | PRN

## 2023-04-23 MED ORDER — ALUM & MAG HYDROXIDE-SIMETH 200-200-20 MG/5ML PO SUSP: 30.0000 mL | ORAL | Status: DC | PRN

## 2023-04-23 MED ORDER — SODIUM CHLORIDE 0.9% FLUSH
3.0000 mL | Freq: Two times a day (BID) | INTRAVENOUS | Status: DC
  Administered 2023-04-24: 3 mL via INTRAVENOUS

## 2023-04-23 MED ORDER — ACETAMINOPHEN 160 MG/5ML PO SOLN: 325.0000 mg | ORAL | Status: DC | PRN

## 2023-04-23 MED ORDER — FENTANYL CITRATE (PF) 100 MCG/2ML IJ SOLN
INTRAMUSCULAR | Status: DC | PRN
  Administered 2023-04-23: 25 ug via INTRAVENOUS
  Administered 2023-04-23: 75 ug via INTRAVENOUS
  Administered 2023-04-23 (×2): 50 ug via INTRAVENOUS

## 2023-04-23 MED ORDER — ACETAMINOPHEN 325 MG PO TABS: 325.0000 mg | ORAL_TABLET | Freq: Four times a day (QID) | ORAL | Status: DC | PRN

## 2023-04-23 MED ORDER — TRANEXAMIC ACID-NACL 1000-0.7 MG/100ML-% IV SOLN
1000.0000 mg | Freq: Once | INTRAVENOUS | Status: AC
  Administered 2023-04-23: 1000 mg via INTRAVENOUS
  Filled 2023-04-23: qty 100

## 2023-04-23 MED ORDER — PRAVASTATIN SODIUM 20 MG PO TABS
10.0000 mg | ORAL_TABLET | Freq: Every day | ORAL | Status: DC
  Administered 2023-04-24: 10 mg via ORAL
  Filled 2023-04-23: qty 1

## 2023-04-23 MED ORDER — ACETAMINOPHEN 500 MG PO TABS
1000.0000 mg | ORAL_TABLET | Freq: Once | ORAL | Status: AC
  Administered 2023-04-23: 1000 mg via ORAL
  Filled 2023-04-23: qty 2

## 2023-04-23 MED ORDER — BISACODYL 10 MG RE SUPP: 10.0000 mg | Freq: Every day | RECTAL | Status: DC | PRN

## 2023-04-23 MED ORDER — METFORMIN HCL 500 MG PO TABS: 1000.0000 mg | ORAL_TABLET | Freq: Every day | ORAL | Status: DC

## 2023-04-23 MED ORDER — CEFAZOLIN SODIUM-DEXTROSE 2-4 GM/100ML-% IV SOLN
2.0000 g | Freq: Four times a day (QID) | INTRAVENOUS | Status: AC
Start: 2023-04-23 — End: 2023-04-24
  Administered 2023-04-23 (×2): 2 g via INTRAVENOUS
  Filled 2023-04-23 (×2): qty 100

## 2023-04-23 MED ORDER — DIPHENHYDRAMINE HCL 12.5 MG/5ML PO ELIX: 12.5000 mg | ORAL_SOLUTION | ORAL | Status: DC | PRN

## 2023-04-23 MED ORDER — DEXAMETHASONE SODIUM PHOSPHATE 10 MG/ML IJ SOLN
INTRAMUSCULAR | Status: DC | PRN
  Administered 2023-04-23: 5 mg via INTRAVENOUS

## 2023-04-23 MED ORDER — HYDROMORPHONE HCL 1 MG/ML IJ SOLN
INTRAMUSCULAR | Status: DC | PRN
  Administered 2023-04-23 (×2): 1 mg via INTRAVENOUS

## 2023-04-23 MED ORDER — FENTANYL CITRATE PF 50 MCG/ML IJ SOSY: 25.0000 ug | PREFILLED_SYRINGE | INTRAMUSCULAR | Status: DC | PRN

## 2023-04-23 MED ORDER — OXYCODONE HCL 5 MG PO TABS
5.0000 mg | ORAL_TABLET | ORAL | Status: DC | PRN
  Administered 2023-04-23 – 2023-04-24 (×5): 5 mg via ORAL
  Filled 2023-04-23: qty 1
  Filled 2023-04-23: qty 2
  Filled 2023-04-23 (×2): qty 1

## 2023-04-23 MED ORDER — METOCLOPRAMIDE HCL 5 MG/ML IJ SOLN: 5.0000 mg | Freq: Three times a day (TID) | INTRAMUSCULAR | Status: DC | PRN

## 2023-04-23 MED ORDER — HYDROMORPHONE HCL 2 MG/ML IJ SOLN
INTRAMUSCULAR | Status: AC
  Filled 2023-04-23: qty 1

## 2023-04-23 MED ORDER — METOCLOPRAMIDE HCL 5 MG PO TABS: 5.0000 mg | ORAL_TABLET | Freq: Three times a day (TID) | ORAL | Status: DC | PRN

## 2023-04-23 MED ORDER — PROPOFOL 10 MG/ML IV BOLUS: INTRAVENOUS | Status: DC | PRN

## 2023-04-23 MED ORDER — POTASSIUM CHLORIDE IN NACL 20-0.45 MEQ/L-% IV SOLN
INTRAVENOUS | Status: DC
  Filled 2023-04-23 (×2): qty 1000

## 2023-04-23 MED ORDER — ONDANSETRON HCL 4 MG PO TABS: 4.0000 mg | ORAL_TABLET | Freq: Four times a day (QID) | ORAL | Status: DC | PRN

## 2023-04-23 MED ORDER — ONDANSETRON HCL 4 MG/2ML IJ SOLN: 4.0000 mg | Freq: Four times a day (QID) | INTRAMUSCULAR | Status: DC | PRN

## 2023-04-23 MED ORDER — ACETAMINOPHEN 500 MG PO TABS
1000.0000 mg | ORAL_TABLET | Freq: Four times a day (QID) | ORAL | Status: AC
  Administered 2023-04-23 – 2023-04-24 (×4): 1000 mg via ORAL
  Filled 2023-04-23 (×4): qty 2

## 2023-04-23 MED ORDER — ACETAMINOPHEN 325 MG PO TABS: 325.0000 mg | ORAL_TABLET | ORAL | Status: DC | PRN

## 2023-04-23 MED ORDER — MAGNESIUM CITRATE PO SOLN: 1.0000 | Freq: Once | ORAL | Status: DC | PRN

## 2023-04-23 MED ORDER — OXYCODONE HCL 5 MG PO TABS
ORAL_TABLET | ORAL | Status: AC
  Filled 2023-04-23: qty 1

## 2023-04-23 MED ORDER — CHLORHEXIDINE GLUCONATE 0.12 % MT SOLN
15.0000 mL | Freq: Once | OROMUCOSAL | Status: AC
  Administered 2023-04-23: 15 mL via OROMUCOSAL

## 2023-04-23 MED ORDER — HYDROMORPHONE HCL 1 MG/ML IJ SOLN
0.5000 mg | INTRAMUSCULAR | Status: DC | PRN
Start: 2023-04-23 — End: 2023-04-24
  Administered 2023-04-23: 0.5 mg via INTRAVENOUS

## 2023-04-23 MED ORDER — SODIUM CHLORIDE 0.9 % IR SOLN
Status: DC | PRN
  Administered 2023-04-23: 1000 mL

## 2023-04-23 MED ORDER — POLYETHYLENE GLYCOL 3350 17 G PO PACK: 17.0000 g | PACK | Freq: Every day | ORAL | Status: DC | PRN

## 2023-04-23 MED ORDER — BUPIVACAINE HCL (PF) 0.25 % IJ SOLN
INTRAMUSCULAR | Status: AC
  Filled 2023-04-23: qty 30

## 2023-04-23 MED ORDER — LIDOCAINE HCL (CARDIAC) PF 100 MG/5ML IV SOSY
PREFILLED_SYRINGE | INTRAVENOUS | Status: DC | PRN
  Administered 2023-04-23: 100 mg via INTRAVENOUS

## 2023-04-23 MED ORDER — OXYCODONE HCL 5 MG PO TABS: 5.0000 mg | ORAL_TABLET | Freq: Once | ORAL | Status: DC | PRN

## 2023-04-23 MED ORDER — DEXMEDETOMIDINE HCL IN NACL 80 MCG/20ML IV SOLN
INTRAVENOUS | Status: AC
  Filled 2023-04-23: qty 20

## 2023-04-23 MED ORDER — ORAL CARE MOUTH RINSE: 15.0000 mL | Freq: Once | OROMUCOSAL | Status: AC

## 2023-04-23 MED ORDER — KETOROLAC TROMETHAMINE 30 MG/ML IJ SOLN
INTRAMUSCULAR | Status: DC | PRN
  Administered 2023-04-23: 31 mL

## 2023-04-23 MED ORDER — ROPIVACAINE HCL 5 MG/ML IJ SOLN
INTRAMUSCULAR | Status: DC | PRN
  Administered 2023-04-23: 20 mL via PERINEURAL

## 2023-04-23 SURGICAL SUPPLY — 58 items
ATTUNE MED DOME PAT 41 KNEE (Knees) IMPLANT
ATTUNE PS FEM LT SZ 8 CEM KNEE (Femur) IMPLANT
BAG COUNTER SPONGE SURGICOUNT (BAG) IMPLANT
BAG SPEC THK2 15X12 ZIP CLS (MISCELLANEOUS)
BAG SPNG CNTER NS LX DISP (BAG)
BAG ZIPLOCK 12X15 (MISCELLANEOUS) IMPLANT
BASE TIBIAL CEM ATTUNE SZ 7 (Knees) ×1 IMPLANT
BASEPLATE TIB CEM ATTUNE SZ7 (Knees) IMPLANT
BLADE SAG 18X100X1.27 (BLADE) ×1 IMPLANT
BLADE SAW SGTL 11.0X1.19X90.0M (BLADE) IMPLANT
BLADE SAW SGTL 13X75X1.27 (BLADE) ×1 IMPLANT
BLADE SURG 15 STRL LF DISP TIS (BLADE) ×1 IMPLANT
BLADE SURG 15 STRL SS (BLADE) ×1
BNDG CMPR MED 10X6 ELC LF (GAUZE/BANDAGES/DRESSINGS) ×1
BNDG ELASTIC 6X10 VLCR STRL LF (GAUZE/BANDAGES/DRESSINGS) ×1 IMPLANT
BOWL SMART MIX CTS (DISPOSABLE) ×1 IMPLANT
BSPLAT TIB 7 CMNT FX BRNG STRL (Knees) ×1 IMPLANT
CATH COUDE FOLEY 5CC 14FR (CATHETERS) IMPLANT
CEMENT HV SMART SET (Cement) ×2 IMPLANT
CLSR STERI-STRIP ANTIMIC 1/2X4 (GAUZE/BANDAGES/DRESSINGS) ×2 IMPLANT
COVER SURGICAL LIGHT HANDLE (MISCELLANEOUS) ×1 IMPLANT
CUFF TOURN SGL QUICK 34 (TOURNIQUET CUFF) ×1
CUFF TRNQT CYL 34X4.125X (TOURNIQUET CUFF) ×1 IMPLANT
DRAPE SHEET LG 3/4 BI-LAMINATE (DRAPES) ×1 IMPLANT
DRAPE U-SHAPE 47X51 STRL (DRAPES) ×1 IMPLANT
DRSG MEPILEX POST OP 4X12 (GAUZE/BANDAGES/DRESSINGS) ×1 IMPLANT
DURAPREP 26ML APPLICATOR (WOUND CARE) ×2 IMPLANT
ELECT REM PT RETURN 15FT ADLT (MISCELLANEOUS) ×1 IMPLANT
GAUZE PAD ABD 8X10 STRL (GAUZE/BANDAGES/DRESSINGS) ×2 IMPLANT
GLOVE BIO SURGEON STRL SZ 6.5 (GLOVE) ×1 IMPLANT
GLOVE BIO SURGEON STRL SZ7.5 (GLOVE) ×1 IMPLANT
GLOVE BIOGEL PI IND STRL 7.0 (GLOVE) ×1 IMPLANT
GLOVE BIOGEL PI IND STRL 8 (GLOVE) ×1 IMPLANT
GOWN STRL SURGICAL XL XLNG (GOWN DISPOSABLE) ×2 IMPLANT
HANDPIECE INTERPULSE COAX TIP (DISPOSABLE) ×1
HOLDER FOLEY CATH W/STRAP (MISCELLANEOUS) IMPLANT
IMMOBILIZER KNEE 20 (SOFTGOODS) ×1
IMMOBILIZER KNEE 20 THIGH 36 (SOFTGOODS) ×1 IMPLANT
INSERT TIB ATTUNE FB SZ8X8 (Insert) IMPLANT
KIT TURNOVER KIT A (KITS) IMPLANT
MANIFOLD NEPTUNE II (INSTRUMENTS) ×1 IMPLANT
MAT HALF PREVALON HALF STRYKER (MISCELLANEOUS) IMPLANT
NS IRRIG 1000ML POUR BTL (IV SOLUTION) ×1 IMPLANT
PACK TOTAL KNEE CUSTOM (KITS) ×1 IMPLANT
PIN STEINMAN FIXATION KNEE (PIN) IMPLANT
PROTECTOR NERVE ULNAR (MISCELLANEOUS) ×1 IMPLANT
SET HNDPC FAN SPRY TIP SCT (DISPOSABLE) ×1 IMPLANT
SET PAD KNEE POSITIONER (MISCELLANEOUS) ×1 IMPLANT
SPIKE FLUID TRANSFER (MISCELLANEOUS) IMPLANT
SUT VIC AB 1 CT1 36 (SUTURE) ×2 IMPLANT
SUT VIC AB 2-0 CT1 27 (SUTURE) ×2
SUT VIC AB 2-0 CT1 TAPERPNT 27 (SUTURE) ×1 IMPLANT
SUT VIC AB 3-0 SH 27 (SUTURE) ×3
SUT VIC AB 3-0 SH 27X BRD (SUTURE) IMPLANT
TRAY FOLEY MTR SLVR 16FR STAT (SET/KITS/TRAYS/PACK) ×1 IMPLANT
TUBE SUCTION HIGH CAP CLEAR NV (SUCTIONS) ×1 IMPLANT
WATER STERILE IRR 1000ML POUR (IV SOLUTION) ×2 IMPLANT
WRAP KNEE MAXI GEL POST OP (GAUZE/BANDAGES/DRESSINGS) ×1 IMPLANT

## 2023-04-23 NOTE — Anesthesia Postprocedure Evaluation (Signed)
Anesthesia Post Note  Patient: [name]  Procedure(s) Performed: LEFT TOTAL KNEE ARTHROPLASTY (Left: Knee)     Patient location during evaluation: PACU Anesthesia Type: General Level of consciousness: awake and alert, oriented and patient cooperative Pain management: pain level controlled Vital Signs Assessment: post-procedure vital signs reviewed and stable Respiratory status: spontaneous breathing, nonlabored ventilation and respiratory function stable Cardiovascular status: blood pressure returned to baseline and stable Postop Assessment: no apparent nausea or vomiting Anesthetic complications: no   No notable events documented.  Last Vitals:  Vitals:   04/23/23 1445 04/23/23 1500  BP: (!) 165/89 (!) 156/91  Pulse: 87 79  Resp: 15 11  Temp: 36.6 C   SpO2: 97% 95%    Last Pain:  Vitals:   04/23/23 1445  TempSrc:   PainSc: 0-No pain                 Lannie Fields

## 2023-04-23 NOTE — Anesthesia Preprocedure Evaluation (Addendum)
Anesthesia Evaluation  Patient identified by MRN, date of birth, ID band Patient awake    Reviewed: Allergy & Precautions, NPO status , Patient's Chart, lab work & pertinent test results  History of Anesthesia Complications Negative for: history of anesthetic complications  Airway Mallampati: III  TM Distance: <3 FB Neck ROM: Full   Comment: Previous glidescope intubation Dental  (+) Dental Advisory Given, Teeth Intact, Poor Dentition ANTERIOR :   Pulmonary neg shortness of breath, sleep apnea and Continuous Positive Airway Pressure Ventilation , neg COPD, neg recent URI   Pulmonary exam normal breath sounds clear to auscultation       Cardiovascular hypertension, Pt. on medications (-) angina (-) Past MI, (-) Cardiac Stents and (-) CABG (-) dysrhythmias + Valvular Problems/Murmurs (mild AI and MR, moderate AS) AS, AI and MR  Rhythm:Regular Rate:Normal  TTE 10/11/2022: 1. Aortic stenosis remains moderate. Vmax 3.8 m/s, MG 33 mmHG AVA 1.07  cm2. Gradients are higher than prior study. The aortic valve is tricuspid.  There is severe calcifcation of the aortic valve. There is severe  thickening of the aortic valve. Aortic  valve regurgitation is mild. Moderate aortic valve stenosis. Aortic valve  area, by VTI measures 1.07 cm. Aortic valve mean gradient measures 33.0  mmHg. Aortic valve Vmax measures 3.84 m/s.   2. Left ventricular ejection fraction, by estimation, is 60 to 65%. The  left ventricle has normal function. The left ventricle has no regional  wall motion abnormalities. Left ventricular diastolic parameters are  consistent with Grade I diastolic  dysfunction (impaired relaxation).   3. Right ventricular systolic function is normal. The right ventricular  size is normal. Tricuspid regurgitation signal is inadequate for assessing  PA pressure.   4. The mitral valve is grossly normal. Mild mitral valve regurgitation.  No  evidence of mitral stenosis.   5. The inferior vena cava is normal in size with greater than 50%  respiratory variability, suggesting right atrial pressure of 3 mmHg.   Normal stress test 09/25/2022    Neuro/Psych  Neuromuscular disease negative neurological ROS     GI/Hepatic Neg liver ROS,GERD  ,,S/p roux-en-Y gastric bypass in 2013   Endo/Other  diabetes, Well Controlled, Type 2, Oral Hypoglycemic AgentsHypothyroidism    Renal/GU negative Renal ROS   H/o prostate cancer s/p radioactive seeds 2020    Musculoskeletal  (+) Arthritis , Osteoarthritis,    Abdominal  (+) + obese  Peds  Hematology  (+) Blood dyscrasia, anemia   Anesthesia Other Findings  Procedure Name: Intubation Date/Time: 12/18/2022 10:33 AM   Performed by: Johnette Abraham, CRNAPre-anesthesia Checklist: Patient identified, Emergency Drugs available, Suction available and Patient being monitored Patient Re-evaluated:Patient Re-evaluated prior to induction Oxygen Delivery Method: Circle System Utilized Preoxygenation: Pre-oxygenation with 100% oxygen Induction Type: IV induction Ventilation: Mask ventilation without difficulty Laryngoscope Size: Mac and 4 Grade View: Grade III Tube type: Oral Tube size: 8.0 mm Number of attempts: 2 Airway Equipment and Method: Stylet and Oral airway Placement Confirmation: ETT inserted through vocal cords under direct vision, positive ETCO2 and breath sounds checked- equal and bilateral Secured at: 24 cm     Reproductive/Obstetrics                             Anesthesia Physical Anesthesia Plan  ASA: 4  Anesthesia Plan: General   Post-op Pain Management: Tylenol PO (pre-op)* and Regional block*   Induction: Intravenous  PONV Risk Score and Plan:  2 and Ondansetron, Dexamethasone and Treatment may vary due to age or medical condition  Airway Management Planned: LMA and Oral ETT  Additional Equipment: ClearSight  Intra-op Plan:    Post-operative Plan: Extubation in OR  Informed Consent: I have reviewed the patients History and Physical, chart, labs and discussed the procedure including the risks, benefits and alternatives for the proposed anesthesia with the patient or authorized representative who has indicated his/her understanding and acceptance.     Dental advisory given  Plan Discussed with: CRNA and Anesthesiologist  Anesthesia Plan Comments: (See PAT note 12/05/2022 Discussed potential risks of nerve blocks including, but not limited to, infection, bleeding, nerve damage, seizures, pneumothorax, respiratory depression, and potential failure of the block. Alternatives to nerve blocks discussed. All questions answered. Risks of general anesthesia discussed including, but not limited to, sore throat, hoarse voice, chipped/damaged teeth, injury to vocal cords, nausea and vomiting, allergic reactions, lung infection, heart attack, stroke, and death. All questions answered.     The study is normal. The study is low risk.   No ST deviation was noted.   LV perfusion is normal. There is no evidence of ischemia. There is no evidence of infarction.   Left ventricular function is normal. Nuclear stress EF: 54 %. The left ventricular ejection fraction is mildly decreased (45-54%). End diastolic cavity size is normal. End systolic cavity size is normal.   Prior study available for comparison from 08/26/2018.   Normal stress nuclear study with inferior thinning but no ischemia or infarction; gated EF 54 with normal wall motion.  )       Anesthesia Quick Evaluation

## 2023-04-23 NOTE — Transfer of Care (Signed)
Immediate Anesthesia Transfer of Care Note  Patient: Dillon Jones  Procedure(s) Performed: LEFT TOTAL KNEE ARTHROPLASTY (Left: Knee)  Patient Location: PACU  Anesthesia Type:General  Level of Consciousness: drowsy and patient cooperative  Airway & Oxygen Therapy: Patient Spontanous Breathing and Patient connected to face mask oxygen  Post-op Assessment: Report given to RN and Post -op Vital signs reviewed and stable  Post vital signs: Reviewed and stable  Last Vitals:  Vitals Value Taken Time  BP 173/71 04/23/23 1409  Temp    Pulse 87 04/23/23 1412  Resp 14 04/23/23 1412  SpO2 100 % 04/23/23 1412  Vitals shown include unfiled device data.  Last Pain:  Vitals:   04/23/23 1030  TempSrc:   PainSc: 0-No pain      Patients Stated Pain Goal: 3 (04/23/23 2952)  Complications: No notable events documented.

## 2023-04-23 NOTE — Interval H&P Note (Signed)
History and Physical Interval Note:  04/23/2023 9:38 AM  Dillon Jones  has presented today for surgery, with the diagnosis of left knee DJD.  The various methods of treatment have been discussed with the patient and family. After consideration of risks, benefits and other options for treatment, the patient has consented to  Procedure(s): TOTAL KNEE ARTHROPLASTY (Left) as a surgical intervention.  The patient's history has been reviewed, patient examined, no change in status, stable for surgery.  I have reviewed the patient's chart and labs.  Questions were answered to the patient's satisfaction.     Eulas Post

## 2023-04-23 NOTE — Anesthesia Procedure Notes (Signed)
Procedure Name: Intubation Date/Time: 04/23/2023 11:04 AM  Performed by: Maurene Capes, CRNAPre-anesthesia Checklist: Patient identified, Emergency Drugs available, Suction available and Patient being monitored Patient Re-evaluated:Patient Re-evaluated prior to induction Oxygen Delivery Method: Circle System Utilized Preoxygenation: Pre-oxygenation with 100% oxygen Induction Type: IV induction Ventilation: Mask ventilation without difficulty Laryngoscope Size: Glidescope and 4 Grade View: Grade II Tube type: Oral Tube size: 8.0 mm Number of attempts: 1 Airway Equipment and Method: Rigid stylet Placement Confirmation: ETT inserted through vocal cords under direct vision, positive ETCO2 and breath sounds checked- equal and bilateral Secured at: 23 cm Tube secured with: Tape Dental Injury: Teeth and Oropharynx as per pre-operative assessment

## 2023-04-23 NOTE — Progress Notes (Signed)
PT Note  Patient Details Name: Dillon Jones MRN: [id number] DOB: 09/23/1950   Cancelled Treatment:    Reason Eval/Treat Not Completed: Patient declined, dinner arrived and pt preferred to eat dinner. Pt is aware of PT eval 11/13. PT will continue to follow acutely.    Johnny Bridge, PT Acute Rehab  Jacqualyn Posey 04/23/2023, 6:51 PM

## 2023-04-23 NOTE — Op Note (Signed)
Procedure: Complicated Foley catheter placement  Preoperative diagnosis: Buried penis  Postoperative diagnosis: same  Anesthesia: General  Drains: 31 French coud catheter  Complications: None  Indications:  Mr. Dillon Jones is a 72 year old gentleman who I was asked to see in consultation by Dr. Dion Saucier. Patient has a history of prostate cancer is followed by Dr. Annabell Howells.  He had brachytherapy in 2020.  On exam, patient was found to have a buried penis with some tight foreskin scarring.  I was not able to expose the meatus.  The patient was prepped in the usual sterile fashion.  I used the tip of my finger to dilate his foreskin.  Ultimately I was able to get a 16 [place] coud through and advanced into the bladder.  Once in place the balloon was filled with 10 cc of sterile water and the catheter was connected to the appropriate collection bag.

## 2023-04-23 NOTE — Op Note (Signed)
DATE OF SURGERY:  04/23/2023 TIME: 1:35 PM  PATIENT NAME:  Dillon Jones   AGE: 72 y.o.    PRE-OPERATIVE DIAGNOSIS: Left knee primary localized osteoarthritis  POST-OPERATIVE DIAGNOSIS:  Same  PROCEDURE: Left total Knee Arthroplasty  SURGEON:  Eulas Post, MD   ASSISTANT:  Janine Ores, PA-C, present and scrubbed throughout the case, critical for assistance with exposure, retraction, instrumentation, and closure.   OPERATIVE IMPLANTS:  Implant Name Type Inv. Item Serial No. Manufacturer Lot No. LRB No. Used Action  CEMENT HV SMART SET - [id number] Cement CEMENT HV SMART SET  [employer] [id number] Left 1 Implanted  CEMENT HV SMART SET - [id number] Cement CEMENT HV SMART SET  [employer] [id number] Left 1 Implanted  BSPLAT TIB 7 CMNT FX BRNG STRL - [id number] Knees BSPLAT TIB 7 CMNT FX BRNG STRL  [employer] [id number] Left 1 Implanted  INSERT TIB ATTUNE FB SZ8X8 - [id number] Insert INSERT TIB ATTUNE FB SZ8X8  [employer] [id number] Left 1 Implanted  ATTUNE PS FEM LT SZ 8 CEM KNEE - [id number] Femur ATTUNE PS FEM LT SZ 8 CEM KNEE  [employer] [id number] Left 1 Implanted  ATTUNE MED DOME PAT 41 KNEE - [id number] Knees ATTUNE MED DOME PAT 41 KNEE  [employer] [id number] Left 1 Implanted     PREOPERATIVE INDICATIONS:  Dillon Jones is a 72 y.o. year old male with end stage bone on bone degenerative arthritis of the knee who failed conservative treatment, including injections, antiinflammatories, activity modification, and assistive devices, and had significant impairment of their activities of daily living, and elected for Total Knee Arthroplasty.   The risks, benefits, and alternatives were discussed at length including but not limited to the risks of infection, bleeding, nerve injury, stiffness, blood clots, the need for revision surgery, cardiopulmonary complications, among others, and they were willing to proceed.  OPERATIVE FINDINGS AND UNIQUE  ASPECTS OF THE CASE: Similar to the other side, we had difficulty placing the Foley, and Dr. Lafonda Mosses came in and assisted with the placement of the Foley using a coud catheter, which we attempted, but were unsuccessful, but Dr. Lafonda Mosses was able to achieve it.  He had substantial hypertrophic osteophyte formation everywhere, the patella was completely encased, I removed 2 large loose bodies from the posterolateral recess.  The medial side had extreme scalloping.  I had a wafer thin cut on the medial side but very thick on the lateral side, which restored congruity.  The femur I severe hypoplasia, and I had to use Whitesides lines in order to orient the 4-in-1 cutting block, this was similar to the other side.  ESTIMATED BLOOD LOSS: 250 mL  OPERATIVE DESCRIPTION:  The patient was brought to the operative room and placed in a supine position.  Anesthesia was administered.  IV antibiotics were given.  The lower extremity was prepped and draped in the usual sterile fashion.  Time out was performed.  The leg was elevated and exsanguinated and the tourniquet was inflated.  Anterior quadriceps tendon splitting approach was performed.  The patella was everted and osteophytes were removed.  The anterior horn of the medial and lateral meniscus was removed.   The patella was then measured, and cut with the saw.  The thickness before the cut was 24 and after the cut was 15.  A metal shield was used to protect the patella throughout the case.    The distal femur was opened with the drill and the intramedullary distal femoral cutting jig  was utilized, set at 5 degrees resecting 10 mm off the distal femur.  Care was taken to protect the collateral ligaments.  Then the extramedullary tibial cutting jig was utilized making the appropriate cut using the anterior tibial crest as a reference building in appropriate posterior slope.  Care was taken during the cut to protect the medial and collateral ligaments.  The  proximal tibia was removed along with the posterior horns of the menisci.  The PCL was sacrificed.    The extensor gap was measured and found to have adequate resection, measuring to a size 6 at least.    The distal femoral sizing jig was applied, taking care to avoid notching.  This was set at 3 degrees of external rotation.  Then the 4-in-1 cutting jig was applied and the anterior and posterior femur was cut, along with the chamfer cuts.  All posterior osteophytes were removed.  The flexion gap was then measured and was symmetric with the extension gap.  I completed the distal femoral preparation using the appropriate jig to prepare the box.  The proximal tibia sized and prepared accordingly with the reamer and the punch, and then all components were trialed with the poly insert.  I trialed with multiple components in the 8 felt best in flexion.  The knee was found to have excellent balance and full motion.    The above named components were then cemented into place and all excess cement was removed.  The real polyethylene implant was placed.  After the cement had cured I released the tourniquet and confirmed excellent hemostasis with no major posterior vessel injury.    The knee was easily taken through a range of motion and the patella tracked well and the knee irrigated copiously and the parapatellar and subcutaneous tissue closed with vicryl, and monocryl with steri strips for the skin.  The wounds were injected with marcaine, and dressed with sterile gauze and the patient was awakened and returned to the PACU in stable and satisfactory condition.  There were no complications.  Total tourniquet time was ~100 minutes.

## 2023-04-23 NOTE — Anesthesia Procedure Notes (Signed)
Anesthesia Regional Block: Adductor canal block   Pre-Anesthetic Checklist: , timeout performed,  Correct Patient, Correct Site, Correct Laterality,  Correct Procedure, Correct Position, site marked,  Risks and benefits discussed,  Surgical consent,  Pre-op evaluation,  At surgeon's request and post-op pain management  Laterality: Left  Prep: chloraprep       Needles:  Injection technique: Single-shot  Needle Type: Echogenic Stimulator Needle     Needle Length: 5cm  Needle Gauge: 22     Additional Needles:   Procedures:,,,, ultrasound used (permanent image in chart),,    Narrative:  Start time: 04/23/2023 9:30 AM End time: 04/23/2023 9:41 AM Injection made incrementally with aspirations every 5 mL.  Performed by: Personally  Anesthesiologist: Bethena Midget, MD  Additional Notes: Functioning IV was confirmed and monitors were applied.  A 50mm 22ga Arrow echogenic stimulator needle was used. Sterile prep and drape,hand hygiene and sterile gloves were used. Ultrasound guidance: relevant anatomy identified, needle position confirmed, local anesthetic spread visualized around nerve(s)., vascular puncture avoided.  Image printed for medical record. Negative aspiration and negative test dose prior to incremental administration of local anesthetic. The patient tolerated the procedure well.

## 2023-04-24 ENCOUNTER — Encounter (HOSPITAL_COMMUNITY): Payer: Self-pay | Admitting: Orthopedic Surgery

## 2023-04-24 DIAGNOSIS — M1712 Unilateral primary osteoarthritis, left knee: Secondary | ICD-10-CM | POA: Diagnosis not present

## 2023-04-24 LAB — CBC
HCT: 35.4 % — ABNORMAL LOW (ref 39.0–52.0)
Hemoglobin: 10.7 g/dL — ABNORMAL LOW (ref 13.0–17.0)
MCH: 27.3 pg (ref 26.0–34.0)
MCHC: 30.2 g/dL (ref 30.0–36.0)
MCV: 90.3 fL (ref 80.0–100.0)
Platelets: 171 10*3/uL (ref 150–400)
RBC: 3.92 MIL/uL — ABNORMAL LOW (ref 4.22–5.81)
RDW: 14.3 % (ref 11.5–15.5)
WBC: 9.1 10*3/uL (ref 4.0–10.5)
nRBC: 0 % (ref 0.0–0.2)

## 2023-04-24 LAB — BASIC METABOLIC PANEL
Anion gap: 9 (ref 5–15)
BUN: 19 mg/dL (ref 8–23)
CO2: 22 mmol/L (ref 22–32)
Calcium: 8.4 mg/dL — ABNORMAL LOW (ref 8.9–10.3)
Chloride: 101 mmol/L (ref 98–111)
Creatinine, Ser: 0.86 mg/dL (ref 0.61–1.24)
GFR, Estimated: >60 mL/min (ref >60)
Glucose, Bld: 129 mg/dL — ABNORMAL HIGH (ref 70–99)
Potassium: 4.5 mmol/L (ref 3.5–5.1)
Sodium: 132 mmol/L — ABNORMAL LOW (ref 135–145)

## 2023-04-24 MED ORDER — HYDROCHLOROTHIAZIDE 12.5 MG PO TABS
12.5000 mg | ORAL_TABLET | Freq: Every day | ORAL | Status: DC
Start: 2023-04-24 — End: 2023-04-24
  Administered 2023-04-24: 12.5 mg via ORAL
  Filled 2023-04-24: qty 1

## 2023-04-24 MED ORDER — ONDANSETRON HCL 4 MG PO TABS: 4.0000 mg | ORAL_TABLET | Freq: Three times a day (TID) | ORAL | 0 refills | Status: DC | PRN

## 2023-04-24 MED ORDER — OXYCODONE HCL 5 MG PO TABS: 5.0000 mg | ORAL_TABLET | ORAL | 0 refills | Status: DC | PRN

## 2023-04-24 MED ORDER — ASPIRIN 325 MG PO TBEC: 325.0000 mg | DELAYED_RELEASE_TABLET | Freq: Two times a day (BID) | ORAL | 0 refills | Status: DC

## 2023-04-24 MED ORDER — SENNA-DOCUSATE SODIUM 8.6-50 MG PO TABS: 2.0000 | ORAL_TABLET | Freq: Every day | ORAL | 1 refills | Status: DC

## 2023-04-24 NOTE — TOC Transition Note (Signed)
Transition of Care [facility]) - CM/SW Discharge Note   Patient Details  Name: Dillon Jones MRN: [id number] Date of Birth: 1951/04/13  [facility] of [facility] CM/SW Contact:  Amada Jupiter, LCSW Phone Number: 04/24/2023, 9:37 AM   Clinical Narrative:     Met with pt who confirms he has needed DME in the home.  Pt aware that HHPT prearranged with [facility] via ortho MD office prior to surgery.  No further TOC needs.  Final next level of care: Home w Home Health Services     Patient Goals and CMS Choice      Discharge Placement                         Discharge Plan and Services Additional resources added to the After Visit Summary for                  DME Arranged: N/A DME Agency: NA       HH Arranged: PT HH Agency: [facility]        Social Determinants of Health (SDOH) Interventions SDOH Screenings   Food Insecurity: No Food Insecurity (03/16/2023)  Housing: Patient Declined (03/16/2023)  Transportation Needs: No Transportation Needs (03/16/2023)  Utilities: Not At Risk (12/18/2022)  Alcohol Screen: Low Risk  (03/16/2023)  Depression (PHQ2-9): Low Risk  (03/20/2023)  Financial Resource Strain: Low Risk  (03/16/2023)  Physical Activity: Sufficiently Active (03/16/2023)  Social Connections: Moderately Integrated (03/16/2023)  Stress: No Stress Concern Present (03/16/2023)  Tobacco Use: Low Risk  (04/23/2023)     Readmission Risk Interventions     No data to display

## 2023-04-24 NOTE — Progress Notes (Signed)
Subjective: 1 Day Post-Op s/p Procedure(s): LEFT TOTAL KNEE ARTHROPLASTY   Patient is alert, oriented. Pain so far well controlled with lower dose oxycodone. Foley catheter removed, not yet voided on own.  Denies chest pain, SOB, Calf pain. No nausea/vomiting. No other complaints.    Objective:  PE: VITALS:   Vitals:   04/23/23 1719 04/23/23 2238 04/24/23 0236 04/24/23 0614  BP: (!) 140/94 119/78 134/89 125/64  Pulse: 80 78 77 71  Resp: 16 18 17 18   Temp: 97.8 F (36.6 C) 98.3 F (36.8 C) 98 F (36.7 C) 98.2 F (36.8 C)  TempSrc: Oral     SpO2: 98% 98% 99% 98%  Weight:      Height:        ABD soft Sensation intact distally Intact pulses distally Dorsiflexion/Plantar flexion intact Incision: dressing C/D/I  LABS  Results for orders placed or performed during the hospital encounter of 04/23/23 (from the past 24 hour(s))  Glucose, capillary     Status: None   Collection Time: 04/23/23  8:25 AM  Result Value Ref Range   Glucose-Capillary 93 70 - 99 mg/dL   Comment 1 Notify RN   Glucose, capillary     Status: None   Collection Time: 04/23/23 10:43 AM  Result Value Ref Range   Glucose-Capillary 83 70 - 99 mg/dL  Glucose, capillary     Status: Abnormal   Collection Time: 04/23/23  2:24 PM  Result Value Ref Range   Glucose-Capillary 118 (H) 70 - 99 mg/dL  CBC     Status: Abnormal   Collection Time: 04/24/23  3:33 AM  Result Value Ref Range   WBC 9.1 4.0 - 10.5 K/uL   RBC 3.92 (L) 4.22 - 5.81 MIL/uL   Hemoglobin 10.7 (L) 13.0 - 17.0 g/dL   HCT 04.5 (L) 40.9 - 81.1 %   MCV 90.3 80.0 - 100.0 fL   MCH 27.3 26.0 - 34.0 pg   MCHC 30.2 30.0 - 36.0 g/dL   RDW 91.4 78.2 - 95.6 %   Platelets 171 150 - 400 K/uL   nRBC 0.0 0.0 - 0.2 %  Basic metabolic panel     Status: Abnormal   Collection Time: 04/24/23  3:33 AM  Result Value Ref Range   Sodium 132 (L) 135 - 145 mmol/L   Potassium 4.5 3.5 - 5.1 mmol/L   Chloride 101 98 - 111 mmol/L   CO2 22 22 - 32 mmol/L    Glucose, Bld 129 (H) 70 - 99 mg/dL   BUN 19 8 - 23 mg/dL   Creatinine, Ser 2.13 0.61 - 1.24 mg/dL   Calcium 8.4 (L) 8.9 - 10.3 mg/dL   GFR, Estimated >08 >65 mL/min   Anion gap 9 5 - 15    DG Knee Left Port  Result Date: 04/23/2023 CLINICAL DATA:  Postop in PACU. EXAM: PORTABLE LEFT KNEE - 1-2 VIEW COMPARISON:  None Available. FINDINGS: Left knee arthroplasty in expected alignment. No periprosthetic lucency or fracture. There has been patellar resurfacing. Recent postsurgical change includes air and edema in the soft tissues and joint space. IMPRESSION: Left knee arthroplasty without immediate postoperative complication. Electronically Signed   By: Narda Rutherford M.D.   On: 04/23/2023 16:49    Assessment/Plan: Principal Problem:   S/P total knee arthroplasty, left   1 Day Post-Op s/p Procedure(s): LEFT TOTAL KNEE ARTHROPLASTY - post op imaging stable  Weightbearing: WBAT LLE Insicional and dressing care: Reinforce dressings as needed,  VTE prophylaxis:  Aspirin 325mg  BID x 30 days Pain control: continue current regimen, patient requesting no more than 5 mg oxycodone q 4 hrs as 10 mg makes him nauseated Follow - up plan: 2 weeks with Dr. Dion Saucier Dispo: pending progress with PT today, patient had to stay 2 nights to complete stair training after right knee, but  he is hopeful to discharge home today now that both knees have been replaced  Contact information:   Janine Ores, PA-C Weekdays 8-5  After hours and holidays please check [email or web address] for group call information for [facility]  Armida Sans 04/24/2023, 8:03 AM

## 2023-04-24 NOTE — Plan of Care (Signed)
  Problem: Education: Goal: Knowledge of General Education information will improve Description: Including pain rating scale, medication(s)/side effects and non-pharmacologic comfort measures Outcome: Progressing   Problem: Activity: Goal: Risk for activity intolerance will decrease Outcome: Progressing   Problem: Pain Management: Goal: General experience of comfort will improve Outcome: Progressing

## 2023-04-24 NOTE — Evaluation (Signed)
Physical Therapy Evaluation Patient Details Name: Dillon Jones MRN: [id number] DOB: June 24, 1950 Today's Date: 04/24/2023  History of Present Illness  72 yo male presents to therapy s/p L TKA on 04/23/2023 due to failure of conservative measures. Pt PMH includes but is not limited to: anemia, DM II, GERD, umbilical hernia, HLD, HTN, hypothyroidism, gastric bypass, prostate ca, OSA and R TKA (01/02/2023).  Clinical Impression  Pt is s/p TKA resulting in the deficits listed below (see PT Problem List). Pt ambulated 150' with RW, no loss of balance. Reviewed TKA HEP, pt demonstrates good understanding. Will plan to do stair training when pt's wife arrives later today, then I expect he'll be ready to DC home from a PT standpoint.  Pt will benefit from acute skilled PT to increase their independence and safety with mobility to allow discharge.          If plan is discharge home, recommend the following: A little help with bathing/dressing/bathroom;Assist for transportation;Help with stairs or ramp for entrance   Can travel by private vehicle        Equipment Recommendations None recommended by PT  Recommendations for Other Services       Functional Status Assessment Patient has had a recent decline in their functional status and demonstrates the ability to make significant improvements in function in a reasonable and predictable amount of time.     Precautions / Restrictions Precautions Precautions: Fall;Knee Precaution Booklet Issued: Yes (comment) Precaution Comments: reviewed no pillow under knee Restrictions Weight Bearing Restrictions: No LLE Weight Bearing: Weight bearing as tolerated      Mobility  Bed Mobility                    Transfers Overall transfer level: Needs assistance Equipment used: Rolling walker (2 wheels) Transfers: Sit to/from Stand Sit to Stand: Contact guard assist           General transfer comment: VCs hand placement     Ambulation/Gait Ambulation/Gait assistance: Contact guard assist, Supervision Gait Distance (Feet): 150 Feet Assistive device: Rolling walker (2 wheels) Gait Pattern/deviations: Step-through pattern, Decreased stride length Gait velocity: decr     General Gait Details: steady, no loss of balance, VCs for sequencing initially  Stairs            Wheelchair Mobility     Tilt Bed    Modified Rankin (Stroke Patients Only)       Balance Overall balance assessment: Modified Independent                                           Pertinent Vitals/Pain Pain Assessment Pain Assessment: 0-10 Pain Score: 3  Pain Location: L knee Pain Descriptors / Indicators: Sore Pain Intervention(s): Limited activity within patient's tolerance, Monitored during session, Premedicated before session, Ice applied    Home Living Family/patient expects to be discharged to:: Private residence Living Arrangements: Spouse/significant other Available Help at Discharge: Family;Available 24 hours/day Type of Home: House Home Access: Stairs to enter Entrance Stairs-Rails: None Entrance Stairs-Number of Steps: 1 small step onto concrete pad Alternate Level Stairs-Number of Steps: 2 steps to den, no rails Home Layout: Two level Home Equipment: Shower seat;Grab bars - tub/shower;Hand held Stage manager (4 wheels);Cane - single point;Rolling Walker (2 wheels);Grab bars - toilet      Prior Function Prior Level of Function : Driving;Independent/Modified Independent  Mobility Comments: no falls in past 6 months, used cane when going out, no AD in home ADLs Comments: independent     Extremity/Trunk Assessment   Upper Extremity Assessment Upper Extremity Assessment: Overall WFL for tasks assessed    Lower Extremity Assessment Lower Extremity Assessment: LLE deficits/detail LLE Deficits / Details: SLR 3/5, knee AAROM 5-75* LLE Sensation: WNL LLE  Coordination: WNL    Cervical / Trunk Assessment Cervical / Trunk Assessment: Normal  Communication   Communication Communication: No apparent difficulties  Cognition Arousal: Alert Behavior During Therapy: WFL for tasks assessed/performed Overall Cognitive Status: Within Functional Limits for tasks assessed                                          General Comments      Exercises Total Joint Exercises Ankle Circles/Pumps: AROM, Both, 15 reps, Supine Quad Sets: AROM, Both, 5 reps, Supine Short Arc Quad: AROM, Left, 5 reps, Supine Heel Slides: AROM, Left, 5 reps, Supine Hip ABduction/ADduction: AAROM, Left, 5 reps, Supine Straight Leg Raises: AROM, Left, 5 reps, Supine Long Arc Quad: AROM, Left, 5 reps, Seated Knee Flexion: AAROM, Left, 10 reps, Seated Goniometric ROM: 5-75* AAROM L knee   Assessment/Plan    PT Assessment Patient needs continued PT services  PT Problem List Decreased range of motion;Decreased activity tolerance;Decreased mobility       PT Treatment Interventions Gait training;DME instruction;Therapeutic exercise;Therapeutic activities;Patient/family education    PT Goals (Current goals can be found in the Care Plan section)  Acute Rehab PT Goals Patient Stated Goal: woodworking PT Goal Formulation: With patient Time For Goal Achievement: 05/01/23 Potential to Achieve Goals: Good    Frequency 7X/week     Co-evaluation               AM-PAC PT "6 Clicks" Mobility  Outcome Measure Help needed turning from your back to your side while in a flat bed without using bedrails?: None Help needed moving from lying on your back to sitting on the side of a flat bed without using bedrails?: A Little Help needed moving to and from a bed to a chair (including a wheelchair)?: A Little Help needed standing up from a chair using your arms (e.g., wheelchair or bedside chair)?: A Little Help needed to walk in hospital room?: A Little Help  needed climbing 3-5 steps with a railing? : A Little 6 Click Score: 19    End of Session Equipment Utilized During Treatment: Gait belt Activity Tolerance: Patient tolerated treatment well Patient left: in chair;with call bell/phone within reach;with chair alarm set Nurse Communication: Mobility status PT Visit Diagnosis: Difficulty in walking, not elsewhere classified (R26.2);Pain Pain - Right/Left: Left Pain - part of body: Knee    Time: 1025-1050 PT Time Calculation (min) (ACUTE ONLY): 25 min   Charges:   PT Evaluation $PT Eval Moderate Complexity: 1 Mod PT Treatments $Gait Training: 8-22 mins PT General Charges $$ ACUTE PT VISIT: 1 Visit         Tamala Ser PT 04/24/2023  Acute Rehabilitation Services  Office [phone]

## 2023-04-24 NOTE — Discharge Summary (Signed)
Discharge Summary  Patient ID: Dillon Jones MRN: [id number] DOB/AGE: Apr 13, 1951 72 y.o.  Admit date: 04/23/2023 Discharge date: 04/24/2023  Admission Diagnoses:  S/P total knee arthroplasty, left  Discharge Diagnoses:  Principal Problem:   S/P total knee arthroplasty, left   Past Medical History:  Diagnosis Date   Anemia    Arthritis    Diabetes mellitus    Has had for 13 years, type 2   Diabetes mellitus-type II 02/06/2012   GERD (gastroesophageal reflux disease)    Occasional - only 1x q35m   H/O umbilical hernia repair-mesh used; done in [place] 02/06/2012   Hypercholesteremia 02/06/2012   Hyperlipidemia    Hypertension    Hypothyroidism    Lap Roux Y Gastric Bypass Dec 2013 06/09/2012   Morbid obesity (HCC)    Morbid obesity with BMI of 55.8 06/05/2012   Neuromuscular disorder (HCC)    Neuropathy   OSA on CPAP 02/06/2012   Prostate cancer (HCC)    radioactive seeds   Sleep apnea     Surgeries: Procedure(s): LEFT TOTAL KNEE ARTHROPLASTY on 04/23/2023   Consultants (if any):   Discharged Condition: Improved  Hospital Course: ISHAQ BATIS is an 72 y.o. male who was admitted 04/23/2023 with a diagnosis of S/P total knee arthroplasty, left and went to the operating room on 04/23/2023 and underwent the above named procedures.    He was given perioperative antibiotics:  Anti-infectives (From admission, onward)    Start     Dose/Rate Route Frequency Ordered Stop   04/23/23 1700  ceFAZolin (ANCEF) IVPB 2g/100 mL premix        2 g 200 mL/hr over 30 Minutes Intravenous Every 6 hours 04/23/23 1550 04/24/23 0023   04/23/23 0800  ceFAZolin (ANCEF) IVPB 3g/100 mL premix        3 g 200 mL/hr over 30 Minutes Intravenous On call to O.R. 04/23/23 0756 04/23/23 1127     .  He was given sequential compression devices, early ambulation, and aspirin for DVT prophylaxis.  He benefited maximally from the hospital stay and there were no complications.    Recent vital  signs:  Vitals:   04/24/23 0614 04/24/23 0935  BP: 125/64 131/63  Pulse: 71 79  Resp: 18 17  Temp: 98.2 F (36.8 C) 98.3 F (36.8 C)  SpO2: 98% 100%    Recent laboratory studies:  Lab Results  Component Value Date   HGB 10.7 (L) 04/24/2023   HGB 12.7 (L) 04/10/2023   HGB 12.9 (L) 03/20/2023   Lab Results  Component Value Date   WBC 9.1 04/24/2023   PLT 171 04/24/2023   Lab Results  Component Value Date   INR 0.9 12/03/2018   Lab Results  Component Value Date   NA 132 (L) 04/24/2023   K 4.5 04/24/2023   CL 101 04/24/2023   CO2 22 04/24/2023   BUN 19 04/24/2023   CREATININE 0.86 04/24/2023   GLUCOSE 129 (H) 04/24/2023    Discharge Medications:   Allergies as of 04/24/2023   No Known Allergies      Medication List     TAKE these medications    aspirin EC 325 MG tablet Take 1 tablet (325 mg total) by mouth 2 (two) times daily.   CALCIUM PO Take 1 tablet by mouth daily.   hydrochlorothiazide 12.5 MG capsule Commonly known as: MICROZIDE TAKE 1 CAPSULE BY MOUTH EVERY DAY   ketoconazole 2 % cream Commonly known as: NIZORAL Apply 1 Application topically 2 (two)  times daily. What changed:  when to take this reasons to take this   levothyroxine 200 MCG tablet Commonly known as: SYNTHROID TAKE 1 TABLET BY MOUTH EVERY DAY EVERYDAY EXCEPT MONDAY AND FRIDAY. TAKE 2 TIMES DAILY ON MONDAY AND FRIDAY   lisinopril 10 MG tablet Commonly known as: ZESTRIL TAKE 1 TABLET BY MOUTH EVERY DAY   metFORMIN 1000 MG tablet Commonly known as: GLUCOPHAGE Take 1 tablet (1,000 mg total) by mouth daily with breakfast.   multivitamin with minerals Tabs tablet Take 2 tablets by mouth daily.   ondansetron 4 MG tablet Commonly known as: Zofran Take 1 tablet (4 mg total) by mouth every 8 (eight) hours as needed for nausea or vomiting.   oxyCODONE 5 MG immediate release tablet Commonly known as: Roxicodone Take 1 tablet (5 mg total) by mouth every 4 (four) hours as  needed for severe pain (pain score 7-10).   pravastatin 10 MG tablet Commonly known as: PRAVACHOL TAKE 1 TABLET BY MOUTH EVERY DAY   sennosides-docusate sodium 8.6-50 MG tablet Commonly known as: SENOKOT-S Take 2 tablets by mouth daily.        Diagnostic Studies: DG Knee Left Port  Result Date: 04/23/2023 CLINICAL DATA:  Postop in PACU. EXAM: PORTABLE LEFT KNEE - 1-2 VIEW COMPARISON:  None Available. FINDINGS: Left knee arthroplasty in expected alignment. No periprosthetic lucency or fracture. There has been patellar resurfacing. Recent postsurgical change includes air and edema in the soft tissues and joint space. IMPRESSION: Left knee arthroplasty without immediate postoperative complication. Electronically Signed   By: Narda Rutherford M.D.   On: 04/23/2023 16:49    Disposition: Discharge disposition: 01-Home or Self Care          Follow-up Information     Teryl Lucy, MD. Go on 05/06/2023.   Specialty: Orthopedic Surgery Why: Your appointment is scheduled for 4:15. Contact information: [address] [place] [address] [phone]         [facility] Follow up.   Why: [facility] HHPT will provide 6 Home visits prior to starting outpatient physical therapy Contact information: [address] [place] [address] [phone]         [facility] Go on 05/06/2023.   Why: Your outpatient physical therapy has been scheduled. the office will call you with a time Contact information: [facility] [address] [place] [address] [phone]                  Signed: Annita Brod 04/24/2023, 12:45 PM

## 2023-04-24 NOTE — Progress Notes (Signed)
Physical Therapy Treatment Patient Details Name: Dillon Jones MRN: [id number] DOB: 1951/05/06 Today's Date: 04/24/2023   History of Present Illness 72 yo male presents to therapy s/p L TKA on 04/23/2023 due to failure of conservative measures. Pt PMH includes but is not limited to: anemia, DM II, GERD, umbilical hernia, HLD, HTN, hypothyroidism, gastric bypass, prostate ca, OSA and R TKA (01/02/2023).    PT Comments  Stair training completed, pt is ready to DC home from a PT standpoint. Wife present for stair training.     If plan is discharge home, recommend the following: A little help with bathing/dressing/bathroom;Assist for transportation;Help with stairs or ramp for entrance   Can travel by private vehicle        Equipment Recommendations  None recommended by PT    Recommendations for Other Services       Precautions / Restrictions Precautions Precautions: Fall;Knee Precaution Booklet Issued: Yes (comment) Precaution Comments: reviewed no pillow under knee Restrictions Weight Bearing Restrictions: No LLE Weight Bearing: Weight bearing as tolerated     Mobility  Bed Mobility                    Transfers Overall transfer level: Needs assistance Equipment used: Rolling walker (2 wheels) Transfers: Sit to/from Stand Sit to Stand: Modified independent (Device/Increase time)           General transfer comment: good safety awareness    Ambulation/Gait Ambulation/Gait assistance: Supervision Gait Distance (Feet): 90 Feet Assistive device: Rolling walker (2 wheels) Gait Pattern/deviations: Step-through pattern, Decreased stride length Gait velocity: decr     General Gait Details: steady, no loss of balance, distance limited by pain   Stairs Stairs: Yes Stairs assistance: Contact guard assist Stair Management: Two rails, Forwards, Step to pattern Number of Stairs: 3 General stair comments: VCs sequencing, no loss of balance, pt reports he has 2  doorframes he can grasp for entering home   Wheelchair Mobility     Tilt Bed    Modified Rankin (Stroke Patients Only)       Balance Overall balance assessment: Modified Independent                                          Cognition Arousal: Alert Behavior During Therapy: WFL for tasks assessed/performed Overall Cognitive Status: Within Functional Limits for tasks assessed                                             General Comments        Pertinent Vitals/Pain Pain Assessment Pain Assessment: 0-10 Pain Score: 7  Pain Location: L knee Pain Descriptors / Indicators: Sore Pain Intervention(s): Limited activity within patient's tolerance, Monitored during session, Premedicated before session, Ice applied    Home Living Family/patient expects to be discharged to:: Private residence Living Arrangements: Spouse/significant other Available Help at Discharge: Family;Available 24 hours/day Type of Home: House Home Access: Stairs to enter Entrance Stairs-Rails: None Entrance Stairs-Number of Steps: 1 small step onto concrete pad Alternate Level Stairs-Number of Steps: 2 steps to den, no rails Home Layout: Two level Home Equipment: Shower seat;Grab bars - tub/shower;Hand held Stage manager (4 wheels);Cane - single point;Rolling Walker (2 wheels);Grab bars - toilet      Prior Function  PT Goals (current goals can now be found in the care plan section) Acute Rehab PT Goals Patient Stated Goal: woodworking PT Goal Formulation: With patient Time For Goal Achievement: 05/01/23 Potential to Achieve Goals: Good Progress towards PT goals: Progressing toward goals    Frequency    7X/week      PT Plan      Co-evaluation              AM-PAC PT "6 Clicks" Mobility   Outcome Measure  Help needed turning from your back to your side while in a flat bed without using bedrails?: None Help needed moving from  lying on your back to sitting on the side of a flat bed without using bedrails?: None Help needed moving to and from a bed to a chair (including a wheelchair)?: None Help needed standing up from a chair using your arms (e.g., wheelchair or bedside chair)?: None Help needed to walk in hospital room?: None Help needed climbing 3-5 steps with a railing? : A Little 6 Click Score: 23    End of Session Equipment Utilized During Treatment: Gait belt Activity Tolerance: Patient tolerated treatment well Patient left: with call bell/phone within reach;in bed;with family/visitor present Nurse Communication: Mobility status PT Visit Diagnosis: Difficulty in walking, not elsewhere classified (R26.2);Pain Pain - Right/Left: Left Pain - part of body: Knee     Time: 1610-9604 PT Time Calculation (min) (ACUTE ONLY): 13 min  Charges:    $Gait Training: 8-22 mins PT General Charges $$ ACUTE PT VISIT: 1 Visit                     Tamala Ser PT 04/24/2023  Acute Rehabilitation Services  Office [phone]

## 2023-04-25 DIAGNOSIS — E039 Hypothyroidism, unspecified: Secondary | ICD-10-CM | POA: Diagnosis not present

## 2023-04-25 DIAGNOSIS — G4733 Obstructive sleep apnea (adult) (pediatric): Secondary | ICD-10-CM | POA: Diagnosis not present

## 2023-04-25 DIAGNOSIS — I10 Essential (primary) hypertension: Secondary | ICD-10-CM | POA: Diagnosis not present

## 2023-04-25 DIAGNOSIS — Z471 Aftercare following joint replacement surgery: Secondary | ICD-10-CM | POA: Diagnosis not present

## 2023-04-25 DIAGNOSIS — K219 Gastro-esophageal reflux disease without esophagitis: Secondary | ICD-10-CM | POA: Diagnosis not present

## 2023-04-25 DIAGNOSIS — Z7984 Long term (current) use of oral hypoglycemic drugs: Secondary | ICD-10-CM | POA: Diagnosis not present

## 2023-04-25 DIAGNOSIS — Z8546 Personal history of malignant neoplasm of prostate: Secondary | ICD-10-CM | POA: Diagnosis not present

## 2023-04-25 DIAGNOSIS — E7800 Pure hypercholesterolemia, unspecified: Secondary | ICD-10-CM | POA: Diagnosis not present

## 2023-04-25 DIAGNOSIS — Z96652 Presence of left artificial knee joint: Secondary | ICD-10-CM | POA: Diagnosis not present

## 2023-04-25 DIAGNOSIS — E119 Type 2 diabetes mellitus without complications: Secondary | ICD-10-CM | POA: Diagnosis not present

## 2023-04-30 DIAGNOSIS — M1712 Unilateral primary osteoarthritis, left knee: Secondary | ICD-10-CM | POA: Diagnosis not present

## 2023-05-06 DIAGNOSIS — M25662 Stiffness of left knee, not elsewhere classified: Secondary | ICD-10-CM | POA: Diagnosis not present

## 2023-05-06 DIAGNOSIS — M6281 Muscle weakness (generalized): Secondary | ICD-10-CM | POA: Diagnosis not present

## 2023-05-06 DIAGNOSIS — R262 Difficulty in walking, not elsewhere classified: Secondary | ICD-10-CM | POA: Diagnosis not present

## 2023-05-06 DIAGNOSIS — M1712 Unilateral primary osteoarthritis, left knee: Secondary | ICD-10-CM | POA: Diagnosis not present

## 2023-05-14 DIAGNOSIS — R262 Difficulty in walking, not elsewhere classified: Secondary | ICD-10-CM | POA: Diagnosis not present

## 2023-05-14 DIAGNOSIS — M25662 Stiffness of left knee, not elsewhere classified: Secondary | ICD-10-CM | POA: Diagnosis not present

## 2023-05-14 DIAGNOSIS — M1712 Unilateral primary osteoarthritis, left knee: Secondary | ICD-10-CM | POA: Diagnosis not present

## 2023-05-14 DIAGNOSIS — M6281 Muscle weakness (generalized): Secondary | ICD-10-CM | POA: Diagnosis not present

## 2023-05-16 DIAGNOSIS — M6281 Muscle weakness (generalized): Secondary | ICD-10-CM | POA: Diagnosis not present

## 2023-05-16 DIAGNOSIS — M25662 Stiffness of left knee, not elsewhere classified: Secondary | ICD-10-CM | POA: Diagnosis not present

## 2023-05-16 DIAGNOSIS — R262 Difficulty in walking, not elsewhere classified: Secondary | ICD-10-CM | POA: Diagnosis not present

## 2023-05-16 DIAGNOSIS — M1712 Unilateral primary osteoarthritis, left knee: Secondary | ICD-10-CM | POA: Diagnosis not present

## 2023-05-21 DIAGNOSIS — M25662 Stiffness of left knee, not elsewhere classified: Secondary | ICD-10-CM | POA: Diagnosis not present

## 2023-05-21 DIAGNOSIS — M6281 Muscle weakness (generalized): Secondary | ICD-10-CM | POA: Diagnosis not present

## 2023-05-21 DIAGNOSIS — M1712 Unilateral primary osteoarthritis, left knee: Secondary | ICD-10-CM | POA: Diagnosis not present

## 2023-05-21 DIAGNOSIS — R262 Difficulty in walking, not elsewhere classified: Secondary | ICD-10-CM | POA: Diagnosis not present

## 2023-05-23 DIAGNOSIS — M25662 Stiffness of left knee, not elsewhere classified: Secondary | ICD-10-CM | POA: Diagnosis not present

## 2023-05-23 DIAGNOSIS — R262 Difficulty in walking, not elsewhere classified: Secondary | ICD-10-CM | POA: Diagnosis not present

## 2023-05-23 DIAGNOSIS — M1712 Unilateral primary osteoarthritis, left knee: Secondary | ICD-10-CM | POA: Diagnosis not present

## 2023-05-23 DIAGNOSIS — M6281 Muscle weakness (generalized): Secondary | ICD-10-CM | POA: Diagnosis not present

## 2023-06-11 DIAGNOSIS — M25662 Stiffness of left knee, not elsewhere classified: Secondary | ICD-10-CM | POA: Diagnosis not present

## 2023-06-11 DIAGNOSIS — M1712 Unilateral primary osteoarthritis, left knee: Secondary | ICD-10-CM | POA: Diagnosis not present

## 2023-06-11 DIAGNOSIS — M6281 Muscle weakness (generalized): Secondary | ICD-10-CM | POA: Diagnosis not present

## 2023-06-11 DIAGNOSIS — R262 Difficulty in walking, not elsewhere classified: Secondary | ICD-10-CM | POA: Diagnosis not present

## 2023-06-17 DIAGNOSIS — R3121 Asymptomatic microscopic hematuria: Secondary | ICD-10-CM | POA: Diagnosis not present

## 2023-07-11 DIAGNOSIS — R3129 Other microscopic hematuria: Secondary | ICD-10-CM | POA: Diagnosis not present

## 2023-07-11 DIAGNOSIS — Z8546 Personal history of malignant neoplasm of prostate: Secondary | ICD-10-CM | POA: Diagnosis not present

## 2023-07-11 DIAGNOSIS — N200 Calculus of kidney: Secondary | ICD-10-CM | POA: Diagnosis not present

## 2023-07-11 DIAGNOSIS — K429 Umbilical hernia without obstruction or gangrene: Secondary | ICD-10-CM | POA: Diagnosis not present

## 2023-07-26 ENCOUNTER — Emergency Department (HOSPITAL_BASED_OUTPATIENT_CLINIC_OR_DEPARTMENT_OTHER): Payer: PPO | Admitting: Radiology

## 2023-07-26 ENCOUNTER — Other Ambulatory Visit: Payer: Self-pay

## 2023-07-26 ENCOUNTER — Encounter (HOSPITAL_BASED_OUTPATIENT_CLINIC_OR_DEPARTMENT_OTHER): Payer: Self-pay

## 2023-07-26 ENCOUNTER — Emergency Department (HOSPITAL_BASED_OUTPATIENT_CLINIC_OR_DEPARTMENT_OTHER)
Admission: EM | Admit: 2023-07-26 | Discharge: 2023-07-26 | Disposition: A | Discharge status: Home / Self Care | Payer: PPO | Attending: Emergency Medicine | Admitting: Emergency Medicine

## 2023-07-26 DIAGNOSIS — R0602 Shortness of breath: Secondary | ICD-10-CM | POA: Diagnosis not present

## 2023-07-26 DIAGNOSIS — R079 Chest pain, unspecified: Secondary | ICD-10-CM | POA: Insufficient documentation

## 2023-07-26 DIAGNOSIS — R059 Cough, unspecified: Secondary | ICD-10-CM | POA: Diagnosis not present

## 2023-07-26 DIAGNOSIS — Z7982 Long term (current) use of aspirin: Secondary | ICD-10-CM | POA: Diagnosis not present

## 2023-07-26 LAB — TROPONIN I (HIGH SENSITIVITY)
Troponin I (High Sensitivity): 25 ng/L — ABNORMAL HIGH (ref <18)
Troponin I (High Sensitivity): 24 ng/L — ABNORMAL HIGH (ref <18)

## 2023-07-26 LAB — CBC WITH DIFFERENTIAL/PLATELET
Abs Immature Granulocytes: 0.01 10*3/uL (ref 0.00–0.07)
Basophils Absolute: 0 10*3/uL (ref 0.0–0.1)
Basophils Relative: 1 %
Eosinophils Absolute: 0.1 10*3/uL (ref 0.0–0.5)
Eosinophils Relative: 1 %
HCT: 35.8 % — ABNORMAL LOW (ref 39.0–52.0)
Hemoglobin: 10.8 g/dL — ABNORMAL LOW (ref 13.0–17.0)
Immature Granulocytes: 0 %
Lymphocytes Relative: 6 %
Lymphs Abs: 0.4 10*3/uL — ABNORMAL LOW (ref 0.7–4.0)
MCH: 24.3 pg — ABNORMAL LOW (ref 26.0–34.0)
MCHC: 30.2 g/dL (ref 30.0–36.0)
MCV: 80.4 fL (ref 80.0–100.0)
Monocytes Absolute: 0.5 10*3/uL (ref 0.1–1.0)
Monocytes Relative: 7 %
Neutro Abs: 5.7 10*3/uL (ref 1.7–7.7)
Neutrophils Relative %: 85 %
Platelets: 223 10*3/uL (ref 150–400)
RBC: 4.45 MIL/uL (ref 4.22–5.81)
RDW: 15.7 % — ABNORMAL HIGH (ref 11.5–15.5)
WBC: 6.7 10*3/uL (ref 4.0–10.5)
nRBC: 0 % (ref 0.0–0.2)

## 2023-07-26 LAB — BASIC METABOLIC PANEL
Anion gap: 12 (ref 5–15)
BUN: 21 mg/dL (ref 8–23)
CO2: 25 mmol/L (ref 22–32)
Calcium: 9.7 mg/dL (ref 8.9–10.3)
Chloride: 100 mmol/L (ref 98–111)
Creatinine, Ser: 1.16 mg/dL (ref 0.61–1.24)
GFR, Estimated: >60 mL/min (ref >60)
Glucose, Bld: 112 mg/dL — ABNORMAL HIGH (ref 70–99)
Potassium: 4.8 mmol/L (ref 3.5–5.1)
Sodium: 137 mmol/L (ref 135–145)

## 2023-07-26 LAB — RESP PANEL BY RT-PCR (RSV, FLU A&B, COVID)  RVPGX2
Influenza A by PCR: NEGATIVE
Influenza B by PCR: NEGATIVE
Resp Syncytial Virus by PCR: NEGATIVE
SARS Coronavirus 2 by RT PCR: NEGATIVE

## 2023-07-26 MED ORDER — ASPIRIN 81 MG PO CHEW
324.0000 mg | CHEWABLE_TABLET | Freq: Once | ORAL | Status: AC
  Administered 2023-07-26: 324 mg via ORAL
  Filled 2023-07-26: qty 4

## 2023-07-26 MED ORDER — ACETAMINOPHEN 500 MG PO TABS
1000.0000 mg | ORAL_TABLET | Freq: Once | ORAL | Status: AC
  Administered 2023-07-26: 1000 mg via ORAL
  Filled 2023-07-26: qty 2

## 2023-07-26 MED ORDER — FAMOTIDINE 20 MG PO TABS
20.0000 mg | ORAL_TABLET | Freq: Once | ORAL | Status: AC
  Administered 2023-07-26: 20 mg via ORAL
  Filled 2023-07-26: qty 1

## 2023-07-26 NOTE — ED Provider Notes (Signed)
[place] EMERGENCY DEPARTMENT AT [facility] Provider Note   CSN: [id number] Arrival date & time: 07/26/23  1133     History  Chief Complaint  Patient presents with   Cough   Nasal Congestion    Dillon Jones is a 73 y.o. male.  HPI 73 year old male presents with cough and congestion.  He feels like he has been sick for about 4 days.  No fevers, chest pain, leg swelling or abdominal distention.  He is feeling a little short of breath, especially if he bends over.  Cough occasionally has some clear sputum minute.  No sore throat or runny nose/nasal congestion.  Feels hard to get a deep breath.  Home Medications Prior to Admission medications   Medication Sig Start Date End Date Taking? Authorizing Provider  aspirin EC 325 MG tablet Take 1 tablet (325 mg total) by mouth 2 (two) times daily. 04/24/23   Armida Sans, PA-C  CALCIUM PO Take 1 tablet by mouth daily.    [provider]  hydrochlorothiazide (MICROZIDE) 12.5 MG capsule TAKE 1 CAPSULE BY MOUTH EVERY DAY 12/18/22   Jeani Sow, MD  ketoconazole (NIZORAL) 2 % cream Apply 1 Application topically 2 (two) times daily. Patient taking differently: Apply 1 Application topically daily as needed for irritation. 09/27/22   Jeani Sow, MD  levothyroxine (SYNTHROID) 200 MCG tablet TAKE 1 TABLET BY MOUTH EVERY DAY EVERYDAY EXCEPT MONDAY AND FRIDAY. TAKE 2 TIMES DAILY ON MONDAY AND FRIDAY 12/18/22   Jeani Sow, MD  lisinopril (ZESTRIL) 10 MG tablet TAKE 1 TABLET BY MOUTH EVERY DAY 12/18/22   Jeani Sow, MD  metFORMIN (GLUCOPHAGE) 1000 MG tablet Take 1 tablet (1,000 mg total) by mouth daily with breakfast. 03/07/23   Jeani Sow, MD  Multiple Vitamin (MULTIVITAMIN WITH MINERALS) TABS Take 2 tablets by mouth daily.     [provider]  ondansetron (ZOFRAN) 4 MG tablet Take 1 tablet (4 mg total) by mouth every 8 (eight) hours as needed for nausea or vomiting. 04/24/23   Armida Sans, PA-C   oxyCODONE (ROXICODONE) 5 MG immediate release tablet Take 1 tablet (5 mg total) by mouth every 4 (four) hours as needed for severe pain (pain score 7-10). 04/24/23   Janine Ores K, PA-C  pravastatin (PRAVACHOL) 10 MG tablet TAKE 1 TABLET BY MOUTH EVERY DAY 12/18/22   Jeani Sow, MD  sennosides-docusate sodium (SENOKOT-S) 8.6-50 MG tablet Take 2 tablets by mouth daily. 04/24/23   Armida Sans, PA-C      Allergies    Patient has no known allergies.    Review of Systems   Review of Systems  Constitutional:  Negative for fever.  Respiratory:  Positive for cough and shortness of breath.   Cardiovascular:  Negative for chest pain and leg swelling.  Gastrointestinal:  Negative for abdominal distention.    Physical Exam Updated Vital Signs BP (!) 158/82 (BP Location: Left Arm)   Pulse 100   Temp 98.4 F (36.9 C) (Oral)   Resp 20   Ht 5\' 11"  (1.803 m)   Wt 131.5 kg   SpO2 100%   BMI 40.45 kg/m  Physical Exam Vitals and nursing note reviewed.  Constitutional:      General: He is not in acute distress.    Appearance: He is well-developed. He is obese. He is not ill-appearing or diaphoretic.  HENT:     Head: Normocephalic and atraumatic.  Cardiovascular:     Rate  and Rhythm: Normal rate and regular rhythm.     Heart sounds: Murmur heard.  Pulmonary:     Effort: Pulmonary effort is normal.     Breath sounds: Normal breath sounds. No wheezing, rhonchi or rales.  Abdominal:     Palpations: Abdomen is soft.     Tenderness: There is no abdominal tenderness.  Musculoskeletal:     Right lower leg: No edema.     Left lower leg: No edema.  Skin:    General: Skin is warm and dry.  Neurological:     Mental Status: He is alert.     ED Results / Procedures / Treatments   Labs (all labs ordered are listed, but only abnormal results are displayed) Labs Reviewed  RESP PANEL BY RT-PCR (RSV, FLU A&B, COVID)  [id number]    EKG None  Radiology No results  found.  Procedures Procedures    Medications Ordered in ED Medications - No data to display  ED Course/ Medical Decision Making/ A&P                                Medical Decision Making Amount and/or Complexity of Data Reviewed Labs: ordered.    Details: COVID, flu, RSV testing negative. Radiology: ordered.   There was a delay in Xray imaging. Otherwise this is pending, and if positive will need antibiotics, if negative can likely d/c with supportive care for URI. Care transferred to Dr. Doran Durand.        Final Clinical Impression(s) / ED Diagnoses Final diagnoses:  None    Rx / DC Orders ED Discharge Orders     None         Pricilla Loveless, MD 07/26/23 1506

## 2023-07-26 NOTE — ED Triage Notes (Signed)
Three days of cough and congestion.  Non productive cough. Lungs clea

## 2023-07-26 NOTE — ED Provider Notes (Signed)
Care of patient received from prior provider at 3:05 PM, please see their note for complete H/P and care plan.  Received handoff per ED course.  Clinical Course as of 07/26/23 1505  Fri Jul 26, 2023  1504 Stable  HO SG Cough and SOB. Check for CAP.  Comorbid follow up CXR.   [CC]    Clinical Course User Index [CC] Glyn Ade, MD    Reassessment: Now endorsing chest pain.  HX AS. Reassessment: Now resolved. Troponins flat. HEAR 4.  Shared medical decision making regarding keeping patient admitting for immediate cardiology referral.  However his symptoms completely resolved his troponins are flat and he is connected with cardiology in the outpatient setting. Shared medical decision making and patient feels comfortable with discharge at this time with strict return precautions regarding interval change in symptoms.     Glyn Ade, MD 07/26/23 [phone]

## 2023-08-05 NOTE — Progress Notes (Unsigned)
 Cardiology Office Note:  .   Date:  08/06/2023  ID:  Dillon Jones, DOB July 05, 1950, MRN [id number] PCP: Jeani Sow, MD  [facility] Providers Cardiologist:  Truett Mainland, MD PCP: Jeani Sow, MD  Chief Complaint  Patient presents with   Chest Pain      History of Present Illness: .    Dillon Jones is a 73 y.o. 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 Patient was last seen in 09/2022 by Dr. Shari Prows in our office.  Recently, he was seen in emergency room for chest pain.  He describes feeling of shortness of breath on bending down, and tightness in the chest towards the end of taking deep breath.  Workup showed mildly elevated but flat troponin levels.  Chest x-ray was normal.  He was recommended cardiology follow-up.  Since then, he has not had any chest pain symptoms.  This has coincided with him taking Pepcid, therefore he has attributed chest pain to GI etiology denies any orthopnea, PND, has minimal leg  Patient underwent knee replacement surgeries in July and November.  He is slowly increasing his physical activity, but still remains limited to working inside the house.  He does not do any regular walking.  Patient's last echocardiogram was in 10/2022, showed increasing gradients to aortic valve, but aortic stenosis still remained in the moderate range.   Vitals:   08/06/23 1305 08/06/23 1310  BP: 118/86 (!) 140/90  Pulse: 84   SpO2: 98%      ROS:  Review of Systems  Cardiovascular:  Positive for chest pain (Currently resolved) and leg swelling. Negative for dyspnea on exertion, palpitations and syncope.     Studies Reviewed: Marland Kitchen   EKG Interpretation Date/Time:  Tuesday August 06 2023 13:02:13 EST Ventricular Rate:  84 PR Interval:    QRS Duration:  108 QT Interval:  392 QTC Calculation: 463 R  Axis:   -13  Text Interpretation: Atrial fibrillation with premature ventricular or aberrantly conducted complexes Minimal voltage criteria for LVH, may be normal variant ( R in aVL ) When compared with ECG of 26-Jul-2023 15:49, PREVIOUS ECG IS PRESENT Confirmed by Truett Mainland [phone]) on 08/06/2023 1:06:21 PM    EKG 08/06/2023: Atrial fibrillation with premature ventricular or aberrantly conducted complexes Minimal voltage criteria for LVH, may be normal variant ( R in aVL ) When compared with ECG of 26-Jul-2023 15:49, Atrial fibrillation has replaced Sinus rhythm    Independently interpreted 07/2023: Hb 10.8 Cr 1.16, eGFR >60 Trop HS 25, 24  03/2023: HbA1C 5.5%  11/2022: Chol 196, TG 96, HDL 104, LDL 73  Echocardiogram 10/11/2022: 1. Aortic stenosis remains moderate. Vmax 3.8 m/s, MG 33 mmHG AVA 1.07  cm2. Gradients are higher than prior study. The aortic valve is tricuspid.  There is severe calcifcation of the aortic valve. There is severe  thickening of the aortic valve. Aortic valve regurgitation is mild.  Moderate aortic valve stenosis. Aortic valve area, by VTI measures 1.07 cm. Aortic valve mean gradient measures 33.0 mmHg. Aortic valve Vmax measures 3.84 m/s.   2. Left ventricular ejection fraction, by estimation, is 60 to 65%. The  left ventricle has normal function. The left ventricle has no regional  wall motion abnormalities. Left ventricular diastolic parameters are  consistent with Grade I diastolic  dysfunction (impaired relaxation).   3. Right ventricular systolic function  is normal. The right ventricular  size is normal. Tricuspid regurgitation signal is inadequate for assessing  PA pressure.   4. The mitral valve is grossly normal. Mild mitral valve regurgitation.  No evidence of mitral stenosis.   5. The inferior vena cava is normal in size with greater than 50%  respiratory variability, suggesting right atrial pressure of 3 mmHg.   Nuclear stress test  09/2022:   The study is normal. The study is low risk.   No ST deviation was noted.   LV perfusion is normal. There is no evidence of ischemia. There is no evidence of infarction.   Left ventricular function is normal. Nuclear stress EF: 54 %. The left ventricular ejection fraction is mildly decreased (45-54%). End diastolic cavity size is normal. End systolic cavity size is normal.   Prior study available for comparison from 08/26/2018.   Normal stress nuclear study with inferior thinning but no ischemia or infarction; gated EF 54 with normal wall motion.     Risk Assessment/Calculations:    CHA2DS2-VASc Score = 3  This indicates a 3.2% annual risk of stroke. The patient's score is based upon: CHF History: 0 HTN History: 1 Diabetes History: 1 Stroke History: 0 Vascular Disease History: 0 Age Score: 1 Gender Score: 0      Physical Exam:   Physical Exam Vitals and nursing note reviewed.  Constitutional:      General: He is not in acute distress. Neck:     Vascular: No JVD.  Cardiovascular:     Rate and Rhythm: Normal rate. Rhythm irregular.     Heart sounds: Murmur heard.     Harsh midsystolic murmur is present with a grade of 3/6 at the upper right sternal border radiating to the neck.  Pulmonary:     Effort: Pulmonary effort is normal.     Breath sounds: Normal breath sounds. No wheezing or rales.  Musculoskeletal:     Right lower leg: Edema (Trace) present.     Left lower leg: Edema (Trace) present.      VISIT DIAGNOSES:   ICD-10-CM   1. Nonrheumatic aortic valve stenosis  I35.0 EKG 12-Lead    2. Primary hypertension  I10 EKG 12-Lead       ASSESSMENT AND PLAN: .    Dillon Jones is a 74 y.o. male with hypertension, hyperlipidemia, type 2 diabetes mellitus, OSA on CPAP, aortic stenosis  Aortic stenosis: Historically, moderate in severity.  However, given his recent symptoms of chest pain, mild troponin elevation,I suspect if aortic stenosis is now increase  to severe.  It is quite possible the chest pain was related to severe aortic stenosis.  Even if not, elevated troponin would suggest significant LV stress.  To summarize, I do think this is symptomatic stage D severe aortic stenosis. Recommend echocardiogram.  Based on echocardiogram findings, he may well need left and right heart catheterization. I have reviewed the risks, indications, and alternatives to cardiac catheterization, possible angioplasty, and stenting with the patient. Risks include but are not limited to bleeding, infection, vascular injury, stroke, myocardial infection, arrhythmia, kidney injury, radiation-related injury in the case of prolonged fluoroscopy use, emergency cardiac surgery, and death. The patient understands the risks of serious complication is 1-2 in 1000 with diagnostic cardiac cath and 1-2% or less with angioplasty/stenting.   Based on echocardiogram and potentially, heart catheterization findings, I will then make referral to structural valve clinic.  Paroxysmal atrial fibrillation: This is a new finding.  Patient was in sinus  rhythm during his ER visit on 07/26/2023, but is in rate controlled A-fib today.  He does not have any new symptoms that could be attributed to A-fib.  With his CHA2DS2-VASc Score = 3, his annual stroke risk is 3.6%.  Recommend anticoagulation with Xarelto 20 mg daily.  Xarelto will likely need to be held for 1-2 days before his heart catheterization, anticipating his aortic stenosis to be severe.  This will reset the timeline of 4-6 weeks uninterrupted anticoagulation before considering cardioversion.  Unequal blood pressures: Left arm SBP > Rt arm SBP by SBP 24 mmHg No significant difference in radial pulses. No symptoms associated. He will likely get a CTA for TAVR workup, that would give a clue to presence or absence of supplements  Informed Consent   Shared Decision Making/Informed Consent The risks [stroke (1 in 1000), death (1 in 1000),  kidney failure [usually temporary] (1 in 500), bleeding (1 in 200), allergic reaction [possibly serious] (1 in 200)], benefits (diagnostic support and management of coronary artery disease) and alternatives of a cardiac catheterization were discussed in detail with Mr. Dillon Jones and he is willing to proceed.       Meds ordered this encounter  Medications   rivaroxaban (XARELTO) 20 MG TABS tablet    Sig: Take 1 tablet (20 mg total) by mouth daily with supper.    Dispense:  30 tablet    Refill:  6    I spent 45 minutes in the care of Dillon Jones today including reviewing labs (07/26/2023), reviewing studies (echocardiogram 10/2022), face to face time discussing treatment options (Afib and aortic stenosis), reviewing records from prior office visit and recent ER visit  (07/26/2023), and documenting in the encounter.   F/u in 4 weeks Recommend repeat EKG at follow-up visit to see if patient is still in A-fib.  If yes, he will likely need cardioversion in a few weeks time  Signed, Elder Negus, MD

## 2023-08-06 ENCOUNTER — Ambulatory Visit: Payer: PPO | Attending: Cardiology | Admitting: Cardiology

## 2023-08-06 ENCOUNTER — Encounter: Payer: Self-pay | Admitting: Cardiology

## 2023-08-06 VITALS — BP 138/68 | HR 84 | Ht 71.0 in | Wt 295.6 lb

## 2023-08-06 DIAGNOSIS — I10 Essential (primary) hypertension: Secondary | ICD-10-CM | POA: Diagnosis not present

## 2023-08-06 DIAGNOSIS — I350 Nonrheumatic aortic (valve) stenosis: Secondary | ICD-10-CM | POA: Diagnosis not present

## 2023-08-06 DIAGNOSIS — I480 Paroxysmal atrial fibrillation: Secondary | ICD-10-CM

## 2023-08-06 MED ORDER — RIVAROXABAN 20 MG PO TABS: 20.0000 mg | ORAL_TABLET | Freq: Every day | ORAL | 0 refills | Status: DC

## 2023-08-06 MED ORDER — RIVAROXABAN 20 MG PO TABS
20.0000 mg | ORAL_TABLET | Freq: Every day | ORAL | 6 refills | Status: AC
Start: 2023-08-06 — End: ?

## 2023-08-06 NOTE — Patient Instructions (Signed)
 Medication Instructions:   STOP TAKING ASPIRIN NOW  START TAKING XARELTO 20 MG BY MOUTH DAILY WITH SUPPER  *If you need a refill on your cardiac medications before your next appointment, please call your pharmacy*    Testing/Procedures:  Your physician has requested that you have an echocardiogram. Echocardiography is a painless test that uses sound waves to create images of your heart. It provides your doctor with information about the size and shape of your heart and how well your heart's chambers and valves are working. This procedure takes approximately one hour. There are no restrictions for this procedure.  PLEASE ARRIVE on Thursday 08/08/23  at 7:15 am for 7:30 am echo. YOU CAN'T BE ANY LATER THAN 7:15 AM ON THAT DAY--ECHO WILL BE HERE IN THIS OFFICE SO PLEASE ARRIVE 08/08/23 AT 7:15 AM   Please do NOT wear cologne, perfume, aftershave, or lotions (deodorant is allowed). Please arrive 15 minutes prior to your appointment time.  Please note: We ask at that you not bring children with you during ultrasound (echo/ vascular) testing. Due to room size and safety concerns, children are not allowed in the ultrasound rooms during exams. Our front office staff cannot provide observation of children in our lobby area while testing is being conducted. An adult accompanying a patient to their appointment will only be allowed in the ultrasound room at the discretion of the ultrasound technician under special circumstances. We apologize for any inconvenience.    Follow-Up:  4 WEEKS WITH AN EXTENDER IN THE OFFICE FOR RHYTHM CHECK TO DETERMINE IF IN AFIB OR NOT--IF STILL IN AFIB AT THAT TIME, ARRANGE CARDIOVERSION AT THE TIME

## 2023-08-08 ENCOUNTER — Telehealth: Payer: Self-pay

## 2023-08-08 ENCOUNTER — Ambulatory Visit (HOSPITAL_COMMUNITY): Payer: PPO | Attending: Cardiology

## 2023-08-08 DIAGNOSIS — I350 Nonrheumatic aortic (valve) stenosis: Secondary | ICD-10-CM | POA: Diagnosis not present

## 2023-08-08 DIAGNOSIS — I480 Paroxysmal atrial fibrillation: Secondary | ICD-10-CM | POA: Diagnosis present

## 2023-08-08 DIAGNOSIS — Z01810 Encounter for preprocedural cardiovascular examination: Secondary | ICD-10-CM

## 2023-08-08 LAB — ECHOCARDIOGRAM COMPLETE
AR max vel: 0.83 cm2
AV Area VTI: 0.92 cm2
AV Area mean vel: 0.84 cm2
AV Mean grad: 31.5 mmHg
AV Peak grad: 54.8 mmHg
Ao pk vel: 3.7 m/s
MV M vel: 5.9 m/s
MV Peak grad: 139.2 mmHg
P 1/2 time: 278 ms
S' Lateral: 3.2 cm

## 2023-08-08 MED ORDER — PERFLUTREN LIPID MICROSPHERE
1.0000 mL | INTRAVENOUS | Status: AC | PRN
  Administered 2023-08-08: 3 mL via INTRAVENOUS

## 2023-08-08 NOTE — Telephone Encounter (Signed)
-----   Message from [facility] sent at 08/08/2023 10:50 AM EST ----- I called the patient and left him a voicemail to call back to discuss echocardiogram results. I personally reviewed the echocardiogram, I do think this is severe aortic stenosis.  I do recommend proceeding with left and right heart catheterization and workup for TAVR.  My findings below for documentation only.  Thanks MJP    I personally reviewed and independently interpreted, as well as compared with previous echocardiogram in 10/2022 with images compared side-by-side.  I do think that the valve is clearly more calcified, with reduced excursion.  Mean gradient has increased from 27 mmHg, to 32 mmHg, V-max increased from 3.3 m/s to 3.7 m/s, AVA down from 1.1 cm to 0.86 cm, dimensionless index down from 0.26-0.23.  While AAS may be moderate by mean gradient and V-max criteria, AVA and dimensionless index, as well as 2D evaluation is clearly suggestive of severe aortic stenosis.  In the setting of recent chest pain, mild troponin elevation, clinically, I still think this is stage D symptomatic severe aortic stenosis.  I recommend continuing workup for TAVR, starting with left and right heart catheterization.  Elder Negus, MD

## 2023-08-08 NOTE — Progress Notes (Signed)
 I called the patient and left him a voicemail to call back to discuss echocardiogram results. I personally reviewed the echocardiogram, I do think this is severe aortic stenosis.  I do recommend proceeding with left and right heart catheterization and workup for TAVR.  My findings below for documentation only.  Thanks MJP    I personally reviewed and independently interpreted, as well as compared with previous echocardiogram in 10/2022 with images compared side-by-side.  I do think that the valve is clearly more calcified, with reduced excursion.  Mean gradient has increased from 27 mmHg, to 32 mmHg, V-max increased from 3.3 m/s to 3.7 m/s, AVA down from 1.1 cm to 0.86 cm, dimensionless index down from 0.26-0.23.  While AAS may be moderate by mean gradient and V-max criteria, AVA and dimensionless index, as well as 2D evaluation is clearly suggestive of severe aortic stenosis.  In the setting of recent chest pain, mild troponin elevation, clinically, I still think this is stage D symptomatic severe aortic stenosis.  I recommend continuing workup for TAVR, starting with left and right heart catheterization.  Elder Negus, MD

## 2023-08-08 NOTE — Telephone Encounter (Signed)
 Patient returned call

## 2023-08-08 NOTE — Telephone Encounter (Signed)
 Pt advised Dr Damian Leavell note and recommendation for the R&L Heart Cath... he agrees and verbalizes understanding.   08/16/23 with Dr Rosemary Holms at 7:30 am  Written instructions sent to the pt.

## 2023-08-08 NOTE — Addendum Note (Signed)
 Addended by: Bertram Millard on: 08/08/2023 12:59 PM   Modules accepted: Orders

## 2023-08-12 DIAGNOSIS — Z01810 Encounter for preprocedural cardiovascular examination: Secondary | ICD-10-CM | POA: Diagnosis not present

## 2023-08-13 LAB — BASIC METABOLIC PANEL
BUN/Creatinine Ratio: 16 (ref 10–24)
BUN: 21 mg/dL (ref 8–27)
CO2: 23 mmol/L (ref 20–29)
Calcium: 9.4 mg/dL (ref 8.6–10.2)
Chloride: 103 mmol/L (ref 96–106)
Creatinine, Ser: 1.32 mg/dL — ABNORMAL HIGH (ref 0.76–1.27)
Glucose: 101 mg/dL — ABNORMAL HIGH (ref 70–99)
Potassium: 4.8 mmol/L (ref 3.5–5.2)
Sodium: 141 mmol/L (ref 134–144)
eGFR: 58 mL/min/{1.73_m2} — ABNORMAL LOW (ref >59)

## 2023-08-13 LAB — CBC
Hematocrit: 35.4 % — ABNORMAL LOW (ref 37.5–51.0)
Hemoglobin: 10.9 g/dL — ABNORMAL LOW (ref 13.0–17.7)
MCH: 23.6 pg — ABNORMAL LOW (ref 26.6–33.0)
MCHC: 30.8 g/dL — ABNORMAL LOW (ref 31.5–35.7)
MCV: 77 fL — ABNORMAL LOW (ref 79–97)
Platelets: 247 10*3/uL (ref 150–450)
RBC: 4.62 x10E6/uL (ref 4.14–5.80)
RDW: 14.9 % (ref 11.6–15.4)
WBC: 5.6 10*3/uL (ref 3.4–10.8)

## 2023-08-13 NOTE — Progress Notes (Signed)
 Creatinine only mildly increased compared to 2 weeks ago.  Given his severe aortic stenosis, it may not be advisable to give him additional hydration before obtaining right heart catheterization data.  Continue regular hydration protocol, with additional fluids or diuretics to be added after heart catheterization, depending on filling pressures.  Thanks MJP

## 2023-08-14 ENCOUNTER — Telehealth: Payer: Self-pay | Admitting: *Deleted

## 2023-08-14 NOTE — Telephone Encounter (Signed)
 Cardiac Catheterization scheduled at [facility] for: Friday August 16, 2023 7:30 AM Arrival time [facility] Main Entrance A at: 5:30 AM  Nothing to eat after midnight prior to procedure, clear liquids until 5 AM day of procedure.  Medication instructions: -Hold:  Xarelto-none 08/14/23 until post procedure  Lisinopril/hydrochlorothiazide-day before and day of procedure-per protocol GFR <60 (58)  Metformin-day of procedure and 48 hours post procedure  -Other usual morning medications can be taken with sips of water including aspirin 81 mg.  Plan to go home the same day, you will only stay overnight if medically necessary.  You must have responsible adult to drive you home.  Someone must be with you the first 24 hours after you arrive home.  Reviewed procedure instructions with patient.

## 2023-08-16 ENCOUNTER — Telehealth: Payer: Self-pay | Admitting: *Deleted

## 2023-08-16 ENCOUNTER — Ambulatory Visit (HOSPITAL_COMMUNITY)
Admission: RE | Admit: 2023-08-16 | Discharge: 2023-08-16 | Disposition: A | Discharge status: Home / Self Care | Payer: PPO | Attending: Cardiology | Admitting: Cardiology

## 2023-08-16 ENCOUNTER — Encounter (HOSPITAL_COMMUNITY)
Admission: RE | Disposition: A | Discharge status: Home / Self Care | Payer: Self-pay | Source: Home / Self Care | Attending: Cardiology

## 2023-08-16 DIAGNOSIS — I340 Nonrheumatic mitral (valve) insufficiency: Secondary | ICD-10-CM

## 2023-08-16 DIAGNOSIS — I350 Nonrheumatic aortic (valve) stenosis: Secondary | ICD-10-CM

## 2023-08-16 DIAGNOSIS — I480 Paroxysmal atrial fibrillation: Secondary | ICD-10-CM | POA: Diagnosis not present

## 2023-08-16 DIAGNOSIS — E785 Hyperlipidemia, unspecified: Secondary | ICD-10-CM | POA: Insufficient documentation

## 2023-08-16 DIAGNOSIS — G4733 Obstructive sleep apnea (adult) (pediatric): Secondary | ICD-10-CM | POA: Diagnosis not present

## 2023-08-16 DIAGNOSIS — E119 Type 2 diabetes mellitus without complications: Secondary | ICD-10-CM | POA: Diagnosis not present

## 2023-08-16 DIAGNOSIS — Z7901 Long term (current) use of anticoagulants: Secondary | ICD-10-CM | POA: Diagnosis not present

## 2023-08-16 DIAGNOSIS — I272 Pulmonary hypertension, unspecified: Secondary | ICD-10-CM | POA: Insufficient documentation

## 2023-08-16 DIAGNOSIS — I10 Essential (primary) hypertension: Secondary | ICD-10-CM | POA: Diagnosis not present

## 2023-08-16 HISTORY — PX: RIGHT/LEFT HEART CATH AND CORONARY ANGIOGRAPHY: CATH118266

## 2023-08-16 LAB — GLUCOSE, CAPILLARY
Glucose-Capillary: 108 mg/dL — ABNORMAL HIGH (ref 70–99)
Glucose-Capillary: 115 mg/dL — ABNORMAL HIGH (ref 70–99)

## 2023-08-16 LAB — POCT I-STAT 7, (LYTES, BLD GAS, ICA,H+H)
Acid-base deficit: 2 mmol/L (ref 0.0–2.0)
Bicarbonate: 22.5 mmol/L (ref 20.0–28.0)
Calcium, Ion: 1.17 mmol/L (ref 1.15–1.40)
HCT: 33 % — ABNORMAL LOW (ref 39.0–52.0)
Hemoglobin: 11.2 g/dL — ABNORMAL LOW (ref 13.0–17.0)
O2 Saturation: 96 %
Potassium: 4.3 mmol/L (ref 3.5–5.1)
Sodium: 139 mmol/L (ref 135–145)
TCO2: 24 mmol/L (ref 22–32)
pCO2 arterial: 36.8 mmHg (ref 32–48)
pH, Arterial: 7.394 (ref 7.35–7.45)
pO2, Arterial: 81 mmHg — ABNORMAL LOW (ref 83–108)

## 2023-08-16 SURGERY — RIGHT/LEFT HEART CATH AND CORONARY ANGIOGRAPHY
Anesthesia: LOCAL

## 2023-08-16 MED ORDER — IOHEXOL 350 MG/ML SOLN
INTRAVENOUS | Status: DC | PRN
  Administered 2023-08-16: 40 mL

## 2023-08-16 MED ORDER — SODIUM CHLORIDE 0.9 % WEIGHT BASED INFUSION
3.0000 mL/kg/h | INTRAVENOUS | Status: AC
  Administered 2023-08-16: 3 mL/kg/h via INTRAVENOUS

## 2023-08-16 MED ORDER — SODIUM CHLORIDE 0.9% FLUSH: 3.0000 mL | INTRAVENOUS | Status: DC | PRN

## 2023-08-16 MED ORDER — LABETALOL HCL 5 MG/ML IV SOLN: 10.0000 mg | INTRAVENOUS | Status: DC | PRN

## 2023-08-16 MED ORDER — ONDANSETRON HCL 4 MG/2ML IJ SOLN: 4.0000 mg | Freq: Four times a day (QID) | INTRAMUSCULAR | Status: DC | PRN

## 2023-08-16 MED ORDER — HEPARIN SODIUM (PORCINE) 1000 UNIT/ML IJ SOLN
INTRAMUSCULAR | Status: DC | PRN
  Administered 2023-08-16: 6000 [IU] via INTRAVENOUS

## 2023-08-16 MED ORDER — SODIUM CHLORIDE 0.9% FLUSH: 3.0000 mL | Freq: Two times a day (BID) | INTRAVENOUS | Status: DC

## 2023-08-16 MED ORDER — VERAPAMIL HCL 2.5 MG/ML IV SOLN
INTRAVENOUS | Status: AC
  Filled 2023-08-16: qty 2

## 2023-08-16 MED ORDER — LIDOCAINE HCL (PF) 1 % IJ SOLN
INTRAMUSCULAR | Status: DC | PRN
  Administered 2023-08-16: 5 mL

## 2023-08-16 MED ORDER — MIDAZOLAM HCL 2 MG/2ML IJ SOLN
INTRAMUSCULAR | Status: AC
  Filled 2023-08-16: qty 2

## 2023-08-16 MED ORDER — FENTANYL CITRATE (PF) 100 MCG/2ML IJ SOLN
INTRAMUSCULAR | Status: DC | PRN
  Administered 2023-08-16: 25 ug via INTRAVENOUS

## 2023-08-16 MED ORDER — FENTANYL CITRATE (PF) 100 MCG/2ML IJ SOLN
INTRAMUSCULAR | Status: AC
  Filled 2023-08-16: qty 2

## 2023-08-16 MED ORDER — ASPIRIN 81 MG PO CHEW: 81.0000 mg | CHEWABLE_TABLET | ORAL | Status: DC

## 2023-08-16 MED ORDER — ACETAMINOPHEN 325 MG PO TABS: 650.0000 mg | ORAL_TABLET | ORAL | Status: DC | PRN

## 2023-08-16 MED ORDER — LIDOCAINE HCL (PF) 1 % IJ SOLN
INTRAMUSCULAR | Status: AC
  Filled 2023-08-16: qty 30

## 2023-08-16 MED ORDER — HEPARIN (PORCINE) IN NACL 1000-0.9 UT/500ML-% IV SOLN
INTRAVENOUS | Status: DC | PRN
  Administered 2023-08-16: 1000 mL

## 2023-08-16 MED ORDER — SODIUM CHLORIDE 0.9 % IV SOLN: 250.0000 mL | INTRAVENOUS | Status: DC | PRN

## 2023-08-16 MED ORDER — SODIUM CHLORIDE 0.9 % WEIGHT BASED INFUSION: 1.0000 mL/kg/h | INTRAVENOUS | Status: DC

## 2023-08-16 MED ORDER — MIDAZOLAM HCL 2 MG/2ML IJ SOLN
INTRAMUSCULAR | Status: DC | PRN
  Administered 2023-08-16: 1 mg via INTRAVENOUS

## 2023-08-16 MED ORDER — HYDRALAZINE HCL 20 MG/ML IJ SOLN: 10.0000 mg | INTRAMUSCULAR | Status: DC | PRN

## 2023-08-16 MED ORDER — HEPARIN SODIUM (PORCINE) 1000 UNIT/ML IJ SOLN
INTRAMUSCULAR | Status: AC
  Filled 2023-08-16: qty 10

## 2023-08-16 MED ORDER — VERAPAMIL HCL 2.5 MG/ML IV SOLN
INTRAVENOUS | Status: DC | PRN
  Administered 2023-08-16: 10 mL via INTRA_ARTERIAL

## 2023-08-16 SURGICAL SUPPLY — 12 items
CATH 5FR JL3.5 JR4 ANG PIG MP (CATHETERS) IMPLANT
CATH SWAN GANZ 7F STRAIGHT (CATHETERS) IMPLANT
DEVICE RAD COMP TR BAND LRG (VASCULAR PRODUCTS) IMPLANT
GLIDESHEATH SLEND A-KIT 6F 22G (SHEATH) IMPLANT
GLIDESHEATH SLENDER 7FR .021G (SHEATH) IMPLANT
GUIDEWIRE .025 260CM (WIRE) IMPLANT
GUIDEWIRE INQWIRE 1.5J.035X260 (WIRE) IMPLANT
INQWIRE 1.5J .035X260CM (WIRE) ×1 IMPLANT
KIT SINGLE USE MANIFOLD (KITS) IMPLANT
KIT SYRINGE INJ CVI SPIKEX1 (MISCELLANEOUS) IMPLANT
PACK CARDIAC CATHETERIZATION (CUSTOM PROCEDURE TRAY) ×1 IMPLANT
SET ATX-X65L (MISCELLANEOUS) IMPLANT

## 2023-08-16 NOTE — Discharge Instructions (Signed)

## 2023-08-16 NOTE — H&P (Signed)
 OV 08/06/2023 copied for documentation   Cardiology Office Note:  .   Date:  08/16/2023  ID:  Dillon Jones, DOB December 21, 1950, MRN [id number] PCP: Jeani Sow, MD  [facility] Providers Cardiologist:  Truett Mainland, MD PCP: Jeani Sow, MD  No chief complaint on file.     History of Present Illness: .    Dillon Jones is a 73 y.o. 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 Patient was last seen in 09/2022 by Dr. Shari Prows in our office.  Recently, he was seen in emergency room for chest pain.  He describes feeling of shortness of breath on bending down, and tightness in the chest towards the end of taking deep breath.  Workup showed mildly elevated but flat troponin levels.  Chest x-ray was normal.  He was recommended cardiology follow-up.  Since then, he has not had any chest pain symptoms.  This has coincided with him taking Pepcid, therefore he has attributed chest pain to GI etiology denies any orthopnea, PND, has minimal leg  Patient underwent knee replacement surgeries in July and November.  He is slowly increasing his physical activity, but still remains limited to working inside the house.  He does not do any regular walking.  Patient's last echocardiogram was in 10/2022, showed increasing gradients to aortic valve, but aortic stenosis still remained in the moderate range.   Vitals:   08/16/23 0645 08/16/23 0740  BP: (!) 145/68   Pulse: 90   Resp: 18   Temp: 98.1 F (36.7 C)   SpO2: 95% 97%     ROS:  Review of Systems  Cardiovascular:  Positive for chest pain (Currently resolved) and leg swelling. Negative for dyspnea on exertion, palpitations and syncope.     Studies Reviewed: Marland Kitchen        EKG 08/06/2023: Atrial fibrillation with premature ventricular or aberrantly conducted complexes Minimal voltage criteria for  LVH, may be normal variant ( R in aVL ) When compared with ECG of 26-Jul-2023 15:49, Atrial fibrillation has replaced Sinus rhythm    Independently interpreted 07/2023: Hb 10.8 Cr 1.16, eGFR >60 Trop HS 25, 24  03/2023: HbA1C 5.5%  11/2022: Chol 196, TG 96, HDL 104, LDL 73  Echocardiogram 10/11/2022: 1. Aortic stenosis remains moderate. Vmax 3.8 m/s, MG 33 mmHG AVA 1.07  cm2. Gradients are higher than prior study. The aortic valve is tricuspid.  There is severe calcifcation of the aortic valve. There is severe  thickening of the aortic valve. Aortic valve regurgitation is mild.  Moderate aortic valve stenosis. Aortic valve area, by VTI measures 1.07 cm. Aortic valve mean gradient measures 33.0 mmHg. Aortic valve Vmax measures 3.84 m/s.   2. Left ventricular ejection fraction, by estimation, is 60 to 65%. The  left ventricle has normal function. The left ventricle has no regional  wall motion abnormalities. Left ventricular diastolic parameters are  consistent with Grade I diastolic  dysfunction (impaired relaxation).   3. Right ventricular systolic function is normal. The right ventricular  size is normal. Tricuspid regurgitation signal is inadequate for assessing  PA pressure.   4. The mitral valve is grossly normal. Mild mitral valve regurgitation.  No evidence of mitral stenosis.   5. The inferior vena cava is normal in size with greater than 50%  respiratory variability, suggesting right atrial pressure of 3 mmHg.   Nuclear stress  test 09/2022:   The study is normal. The study is low risk.   No ST deviation was noted.   LV perfusion is normal. There is no evidence of ischemia. There is no evidence of infarction.   Left ventricular function is normal. Nuclear stress EF: 54 %. The left ventricular ejection fraction is mildly decreased (45-54%). End diastolic cavity size is normal. End systolic cavity size is normal.   Prior study available for comparison from 08/26/2018.    Normal stress nuclear study with inferior thinning but no ischemia or infarction; gated EF 54 with normal wall motion.     Risk Assessment/Calculations:    CHA2DS2-VASc Score = 3  This indicates a 3.2% annual risk of stroke. The patient's score is based upon: CHF History: 0 HTN History: 1 Diabetes History: 1 Stroke History: 0 Vascular Disease History: 0 Age Score: 1 Gender Score: 0      Physical Exam:   Physical Exam Vitals and nursing note reviewed.  Constitutional:      General: He is not in acute distress. Neck:     Vascular: No JVD.  Cardiovascular:     Rate and Rhythm: Normal rate. Rhythm irregular.     Heart sounds: Murmur heard.     Harsh midsystolic murmur is present with a grade of 3/6 at the upper right sternal border radiating to the neck.  Pulmonary:     Effort: Pulmonary effort is normal.     Breath sounds: Normal breath sounds. No wheezing or rales.  Musculoskeletal:     Right lower leg: Edema (Trace) present.     Left lower leg: Edema (Trace) present.      VISIT DIAGNOSES: No diagnosis found.    ASSESSMENT AND PLAN: .    Dillon Jones is a 73 y.o. male with hypertension, hyperlipidemia, type 2 diabetes mellitus, OSA on CPAP, aortic stenosis  Aortic stenosis: Historically, moderate in severity.  However, given his recent symptoms of chest pain, mild troponin elevation,I suspect if aortic stenosis is now increase to severe.  It is quite possible the chest pain was related to severe aortic stenosis.  Even if not, elevated troponin would suggest significant LV stress.  To summarize, I do think this is symptomatic stage D severe aortic stenosis. Recommend echocardiogram.  Based on echocardiogram findings, he may well need left and right heart catheterization. I have reviewed the risks, indications, and alternatives to cardiac catheterization, possible angioplasty, and stenting with the patient. Risks include but are not limited to bleeding, infection,  vascular injury, stroke, myocardial infection, arrhythmia, kidney injury, radiation-related injury in the case of prolonged fluoroscopy use, emergency cardiac surgery, and death. The patient understands the risks of serious complication is 1-2 in 1000 with diagnostic cardiac cath and 1-2% or less with angioplasty/stenting.   Based on echocardiogram and potentially, heart catheterization findings, I will then make referral to structural valve clinic.  Paroxysmal atrial fibrillation: This is a new finding.  Patient was in sinus rhythm during his ER visit on 07/26/2023, but is in rate controlled A-fib today.  He does not have any new symptoms that could be attributed to A-fib.  With his CHA2DS2-VASc Score = 3, his annual stroke risk is 3.6%.  Recommend anticoagulation with Xarelto 20 mg daily.  Xarelto will likely need to be held for 1-2 days before his heart catheterization, anticipating his aortic stenosis to be severe.  This will reset the timeline of 4-6 weeks uninterrupted anticoagulation before considering cardioversion.  Unequal blood pressures: Left arm  SBP > Rt arm SBP by SBP 24 mmHg No significant difference in radial pulses. No symptoms associated. He will likely get a CTA for TAVR workup, that would give a clue to presence or absence of supplements  Informed Consent   Shared Decision Making/Informed Consent The risks [stroke (1 in 1000), death (1 in 1000), kidney failure [usually temporary] (1 in 500), bleeding (1 in 200), allergic reaction [possibly serious] (1 in 200)], benefits (diagnostic support and management of coronary artery disease) and alternatives of a cardiac catheterization were discussed in detail with Mr. Shell and he is willing to proceed.       Meds ordered this encounter  Medications   aspirin chewable tablet 81 mg   FOLLOWED BY Linked Order Group    0.9% sodium chloride infusion    0.9% sodium chloride infusion    I spent 45 minutes in the care of HERCHEL HOPKIN  today including reviewing labs (07/26/2023), reviewing studies (echocardiogram 10/2022), face to face time discussing treatment options (Afib and aortic stenosis), reviewing records from prior office visit and recent ER visit  (07/26/2023), and documenting in the encounter.   F/u in 4 weeks Recommend repeat EKG at follow-up visit to see if patient is still in A-fib.  If yes, he will likely need cardioversion in a few weeks time  Signed, Elder Negus, MD

## 2023-08-16 NOTE — Telephone Encounter (Signed)
 Secure chat received by Dr. Rosemary Holms requesting that we refer this pt to our structural valve clinic for consideration of TAVR.   Referral will be placed and I will send our Structural RN Navigators a message to call him back and arrange this consult appt.   Dr. Rosemary Holms will speak more on this with the pt at his cath with him this morning.

## 2023-08-16 NOTE — Telephone Encounter (Signed)
 RE: refer to Structural Clinic per Patwardhan Received: Today Iona Coach, RN  Loa Socks, LPN Thank you Lajoyce Corners!!! I will give him a call on Monday to arrange appointment.  Enjoy your weekend:)

## 2023-08-16 NOTE — Interval H&P Note (Signed)
 History and Physical Interval Note:  08/16/2023 7:45 AM  Dillon Jones  has presented today for surgery, with the diagnosis of aortic stenosis.  The various methods of treatment have been discussed with the patient and family. After consideration of risks, benefits and other options for treatment, the patient has consented to  Procedure(s): RIGHT/LEFT HEART CATH AND CORONARY ANGIOGRAPHY (N/A) as a surgical intervention.  The patient's history has been reviewed, patient examined, no change in status, stable for surgery.  I have reviewed the patient's chart and labs.  Questions were answered to the patient's satisfaction.     Chrysten Woulfe J Delailah Spieth

## 2023-08-16 NOTE — Progress Notes (Signed)
TR BAND REMOVAL  LOCATION:    right radial  DEFLATED PER PROTOCOL:    Yes.    TIME BAND OFF / DRESSING APPLIED:    1015 gauze dressing applied     SITE UPON ARRIVAL:    Level 0  SITE AFTER BAND REMOVAL:    Level 0  CIRCULATION SENSATION AND MOVEMENT:    Within Normal Limits   Yes.    COMMENTS:   no issues noted

## 2023-08-18 ENCOUNTER — Encounter (HOSPITAL_COMMUNITY): Payer: Self-pay | Admitting: Cardiology

## 2023-08-19 LAB — POCT I-STAT EG7
Acid-Base Excess: 0 mmol/L (ref 0.0–2.0)
Bicarbonate: 24.7 mmol/L (ref 20.0–28.0)
Calcium, Ion: 1.19 mmol/L (ref 1.15–1.40)
HCT: 34 % — ABNORMAL LOW (ref 39.0–52.0)
Hemoglobin: 11.6 g/dL — ABNORMAL LOW (ref 13.0–17.0)
O2 Saturation: 67 %
Potassium: 4.4 mmol/L (ref 3.5–5.1)
Sodium: 139 mmol/L (ref 135–145)
TCO2: 26 mmol/L (ref 22–32)
pCO2, Ven: 40.9 mmHg — ABNORMAL LOW (ref 44–60)
pH, Ven: 7.39 (ref 7.25–7.43)
pO2, Ven: 35 mmHg (ref 32–45)

## 2023-08-19 NOTE — Progress Notes (Unsigned)
 Patient ID: Dillon Jones MRN: [id number] DOB/AGE: 71/25/1952 71 y.o.  Primary Care Physician:Kulik, Maryruth Hancock, MD Primary Cardiologist: Rosemary Holms  CC:  Aortic valvular disease management     FOCUSED PROBLEM LIST:   Aortic stenosis AVA 0.92, MG 32 DI 0.24 V-max 3.7 EF 50 to 55% EKG atrial fibrillation without bundle-branch blocks and occasional PVCs Type II DM On metformin Hypertension Hyperlipidemia CKD stage IIIa PAF CV 2 score 3 On Xarelto BMI 41/BSA 2.15 August 2023:  Patient consents to use of AI scribe.***          Past Medical History:  Diagnosis Date   Anemia    Arthritis    Diabetes mellitus    Has had for 13 years, type 2   Diabetes mellitus-type II 02/06/2012   GERD (gastroesophageal reflux disease)    Occasional - only 1x q45m   H/O umbilical hernia repair-mesh used; done in [place] 02/06/2012   Hypercholesteremia 02/06/2012   Hyperlipidemia    Hypertension    Hypothyroidism    Lap Roux Y Gastric Bypass Dec 2013 06/09/2012   Morbid obesity (HCC)    Morbid obesity with BMI of 55.8 06/05/2012   Neuromuscular disorder (HCC)    Neuropathy   OSA on CPAP 02/06/2012   Prostate cancer (HCC)    radioactive seeds   Sleep apnea     Past Surgical History:  Procedure Laterality Date   BREATH TEK H PYLORI  04/22/2012   Procedure: BREATH TEK H PYLORI;  Surgeon: Valarie Merino, MD;  Location: [facility];  Service: General;  Laterality: N/A;   GASTRIC ROUX-EN-Y  06/09/2012   Procedure: LAPAROSCOPIC ROUX-EN-Y GASTRIC;  Surgeon: Valarie Merino, MD;  Location: [facility];  Service: General;  Laterality: N/A;   HERNIA REPAIR  1992   umbilical   PROSTATE BIOPSY     RADIOACTIVE SEED IMPLANT N/A 12/09/2018   Procedure: RADIOACTIVE SEED IMPLANT/BRACHYTHERAPY IMPLANT;  Surgeon: Bjorn Pippin, MD;  Location: [facility];  Service: Urology;  Laterality: N/A;   right knee arthroscopy  1999   RIGHT/LEFT HEART CATH AND CORONARY ANGIOGRAPHY N/A 08/16/2023   Procedure:  RIGHT/LEFT HEART CATH AND CORONARY ANGIOGRAPHY;  Surgeon: Elder Negus, MD;  Location: [facility];  Service: Cardiovascular;  Laterality: N/A;   SPACE OAR INSTILLATION N/A 12/09/2018   Procedure: SPACE OAR INSTILLATION;  Surgeon: Bjorn Pippin, MD;  Location: [facility];  Service: Urology;  Laterality: N/A;   TOTAL KNEE ARTHROPLASTY Right 12/18/2022   Procedure: TOTAL KNEE ARTHROPLASTY;  Surgeon: Teryl Lucy, MD;  Location: [facility];  Service: Orthopedics;  Laterality: Right;   TOTAL KNEE ARTHROPLASTY Left 04/23/2023   Procedure: LEFT TOTAL KNEE ARTHROPLASTY;  Surgeon: Teryl Lucy, MD;  Location: [facility];  Service: Orthopedics;  Laterality: Left;   VENTRAL HERNIA REPAIR  06/09/2012   Procedure: LAPAROSCOPIC VENTRAL HERNIA;  Surgeon: Valarie Merino, MD;  Location: [facility];  Service: General;;  primary repair of ventral hernia    Family History  Problem Relation Age of Onset   Breast cancer Mother    Vision loss Mother    Varicose Veins Mother    Heart disease Father    Diabetes Father    Hypertension Father    Obesity Father    Varicose Veins Father    Diabetes Brother        2 brothers   Hypertension Brother    Obesity Brother    Diabetes Maternal Grandmother    Obesity Brother    Stroke Brother  Prostate cancer Neg Hx    Colon cancer Neg Hx    Pancreatic cancer Neg Hx     Social History   Socioeconomic History   Marital status: Married    Spouse name: Not on file   Number of children: 3   Years of education: Not on file   Highest education level: Doctorate  Occupational History    Comment: working full time  Tobacco Use   Smoking status: Never    Passive exposure: Never   Smokeless tobacco: Never  Vaping Use   Vaping status: Never Used  Substance and Sexual Activity   Alcohol use: Yes    Alcohol/week: 10.0 standard drinks of alcohol    Types: 2 Glasses of wine, 8 Shots of liquor per week    Comment: 2 shots sev times/wk   Drug use: No   Sexual  activity: Not Currently    Birth control/protection: Abstinence  Other Topics Concern   Not on file  Social History Narrative   Had 3 children but 1 passed away.  No grands   Retired Nurse, adult   Social Drivers of Corporate investment banker Strain: Low Risk  (03/16/2023)   Overall Financial Resource Strain (CARDIA)    Difficulty of Paying Living Expenses: Not hard at all  Food Insecurity: No Food Insecurity (03/16/2023)   Hunger Vital Sign    Worried About Running Out of Food in the Last Year: Never true    Ran Out of Food in the Last Year: Never true  Transportation Needs: No Transportation Needs (03/16/2023)   PRAPARE - Administrator, Civil Service (Medical): No    Lack of Transportation (Non-Medical): No  Physical Activity: Sufficiently Active (03/16/2023)   Exercise Vital Sign    Days of Exercise per Week: 5 days    Minutes of Exercise per Session: 30 min  Stress: No Stress Concern Present (03/16/2023)   [employer] of Occupational Health - Occupational Stress Questionnaire    Feeling of Stress : Not at all  Social Connections: Moderately Integrated (03/16/2023)   Social Connection and Isolation Panel [NHANES]    Frequency of Communication with Friends and Family: Once a week    Frequency of Social Gatherings with Friends and Family: Once a week    Attends Religious Services: More than 4 times per year    Active Member of [employer] or Organizations: Yes    Attends Banker Meetings: More than 4 times per year    Marital Status: Married  Catering manager Violence: Not At Risk (12/18/2022)   Humiliation, Afraid, Rape, and Kick questionnaire    Fear of Current or Ex-Partner: No    Emotionally Abused: No    Physically Abused: No    Sexually Abused: No     Prior to Admission medications   Medication Sig Start Date End Date Taking? Authorizing Provider  CALCIUM PO Take 2 tablets by mouth daily.    [provider]   hydrochlorothiazide (MICROZIDE) 12.5 MG capsule TAKE 1 CAPSULE BY MOUTH EVERY DAY 12/18/22   Jeani Sow, MD  ketoconazole (NIZORAL) 2 % cream Apply 1 Application topically 2 (two) times daily. Patient taking differently: Apply 1 Application topically daily as needed for irritation. 09/27/22   Jeani Sow, MD  levothyroxine (SYNTHROID) 200 MCG tablet TAKE 1 TABLET BY MOUTH EVERY DAY EVERYDAY EXCEPT MONDAY AND FRIDAY. TAKE 2 TIMES DAILY ON MONDAY AND FRIDAY 12/18/22   Jeani Sow, MD  lisinopril (ZESTRIL) 10  MG tablet TAKE 1 TABLET BY MOUTH EVERY DAY 12/18/22   Jeani Sow, MD  metFORMIN (GLUCOPHAGE) 1000 MG tablet Take 1 tablet (1,000 mg total) by mouth daily with breakfast. 03/07/23   Jeani Sow, MD  Multiple Vitamins-Minerals (BARIATRIC MULTIVITAMINS) CAPS Take 2 capsules by mouth daily.    [provider]  pravastatin (PRAVACHOL) 10 MG tablet TAKE 1 TABLET BY MOUTH EVERY DAY 12/18/22   Jeani Sow, MD  rivaroxaban (XARELTO) 20 MG TABS tablet Take 1 tablet (20 mg total) by mouth daily with supper. 08/06/23   Patwardhan, Anabel Bene, MD    No Known Allergies  REVIEW OF SYSTEMS:  General: no fevers/chills/night sweats Eyes: no blurry vision, diplopia, or amaurosis ENT: no sore throat or hearing loss Resp: no cough, wheezing, or hemoptysis CV: no edema or palpitations GI: no abdominal pain, nausea, vomiting, diarrhea, or constipation GU: no dysuria, frequency, or hematuria Skin: no rash Neuro: no headache, numbness, tingling, or weakness of extremities Musculoskeletal: no joint pain or swelling Heme: no bleeding, DVT, or easy bruising Endo: no polydipsia or polyuria  There were no vitals taken for this visit.  PHYSICAL EXAM: GEN:  AO x 3 in no acute distress HEENT: normal Dentition: Normal*** Neck: JVP normal. +2***carotid upstrokes without bruits. No thyromegaly. Lungs: equal expansion, clear bilaterally CV: Apex is discrete and nondisplaced, RRR without  murmur or gallop*** Abd: soft, non-tender, non-distended; no bruit; positive bowel sounds Ext: no edema, ecchymoses, or cyanosis Vascular: 2+ femoral pulses, 2+ radial pulses       Skin: warm and dry without rash Neuro: CN II-XII grossly intact; motor and sensory grossly intact    DATA AND STUDIES:  EKG: EKG from February 2025 demonstrates atrial fibrillation with occasional PVCs  EKG Interpretation Date/Time:    Ventricular Rate:    PR Interval:    QRS Duration:    QT Interval:    QTC Calculation:   R Axis:      Text Interpretation:          Cardiac Studies & Procedures   ______________________________________________________________________________________________ CARDIAC CATHETERIZATION  CARDIAC CATHETERIZATION 08/16/2023  Narrative Coronary angiography 08/16/2023: LM: Normal LAD: Large, no significant disease Lcx: Large, no significant disease RCA: Dominant, no significant disease  LVEDP 28 mmHg  Right heart catheterization 08/16/2023: RA: 15 mmHg RV: 79/0 mmHg PA: 84/34 mmHg, mPAP 53 mmHg PCW: 31 mmHg  AO sats: 96% PA sats: 67%  CO: 7.6 L/min CI: 3.0 L/min/m2  LV-Ao pullback was performed given equivocal findings on echocardiogram Aortic valve mean PG 48 mmHg, AVA 0.94 mm2  Normal coronaries, no significant CAD Severe aortic stenosis Severe pulmonary hypertension. WHO Grp II Elevated filling pressures  Will refer to TAVR clinic  Elder Negus, MD  Findings Coronary Findings Diagnostic  Dominance: Right  Left Main Vessel is large. Vessel is angiographically normal.  Left Anterior Descending Vessel is large. Vessel is angiographically normal.  Left Circumflex Vessel is large. Vessel is angiographically normal.  Right Coronary Artery Vessel is normal in caliber. Vessel is angiographically normal.  Intervention  No interventions have been documented.   STRESS TESTS  MYOCARDIAL PERFUSION IMAGING 09/25/2022  Narrative   The  study is normal. The study is low risk.   No ST deviation was noted.   LV perfusion is normal. There is no evidence of ischemia. There is no evidence of infarction.   Left ventricular function is normal. Nuclear stress EF: 54 %. The left ventricular ejection fraction is mildly decreased (45-54%).  End diastolic cavity size is normal. End systolic cavity size is normal.   Prior study available for comparison from 08/26/2018.  Normal stress nuclear study with inferior thinning but no ischemia or infarction; gated EF 54 with normal wall motion.   ECHOCARDIOGRAM  ECHOCARDIOGRAM COMPLETE 08/08/2023  Narrative ECHOCARDIOGRAM REPORT    Patient Name:   Dillon Jones Date of Exam: 08/08/2023 Medical Rec #:  [id number]      Height:       71.0 in Accession #:    [id number]     Weight:       295.6 lb Date of Birth:  1950-08-30      BSA:          2.489 m Patient Age:    72 years       BP:           138/68 mmHg Patient Gender: M              HR:           105 bpm. Exam Location:  [address]  Procedure: 2D Echo, Cardiac Doppler, Color Doppler and Intracardiac Opacification Agent (Both Spectral and Color Flow Doppler were utilized during procedure).  Indications:    I35.0 Aortic Stenosis  History:        Patient has prior history of Echocardiogram examinations, most recent 10/11/2022. Risk Factors:Hypertension, Diabetes and Dyslipidemia. Obstructive sleep apnea-CPAP.  Sonographer:    Daphine Deutscher RDCS Referring Phys: [id number] [facility] J PATWARDHAN  IMPRESSIONS   1. Left ventricular ejection fraction, by estimation, is 50 to 55%. The left ventricle has low normal function. The left ventricle has no regional wall motion abnormalities. There is moderate concentric left ventricular hypertrophy. Left ventricular diastolic parameters are indeterminate. 2. Right ventricular systolic function is normal. The right ventricular size is normal. 3. Left atrial size was severely dilated. 4. The  mitral valve is degenerative. Trivial mitral valve regurgitation. No evidence of mitral stenosis. 5. The aortic valve is calcified. There is severe calcifcation of the aortic valve. There is severe thickening of the aortic valve. Aortic valve regurgitation is mild. Moderate aortic valve stenosis. Aortic valve area, by VTI measures 0.92 cm. Aortic valve mean gradient measures 31.5 mmHg. Aortic valve Vmax measures 3.70 m/s. 6. The inferior vena cava is dilated in size with >50% respiratory variability, suggesting right atrial pressure of 8 mmHg.  FINDINGS Left Ventricle: Left ventricular ejection fraction, by estimation, is 50 to 55%. The left ventricle has low normal function. The left ventricle has no regional wall motion abnormalities. Definity contrast agent was given IV to delineate the left ventricular endocardial borders. Strain imaging was not performed. The left ventricular internal cavity size was normal in size. There is moderate concentric left ventricular hypertrophy. Left ventricular diastolic parameters are indeterminate.  Right Ventricle: The right ventricular size is normal. No increase in right ventricular wall thickness. Right ventricular systolic function is normal.  Left Atrium: Left atrial size was severely dilated.  Right Atrium: Right atrial size was normal in size.  Pericardium: There is no evidence of pericardial effusion.  Mitral Valve: The mitral valve is degenerative in appearance. Trivial mitral valve regurgitation. No evidence of mitral valve stenosis.  Tricuspid Valve: The tricuspid valve is normal in structure. Tricuspid valve regurgitation is not demonstrated. No evidence of tricuspid stenosis.  The aortic valve is calcified. There is severe calcifcation of the aortic valve. There is severe thickening of the aortic valve. Aortic valve regurgitation is mild. Moderate aortic  stenosis is present. Pulmonic Valve: The pulmonic valve was normal in structure. Pulmonic  valve regurgitation is not visualized. No evidence of pulmonic stenosis.  Aorta: The aortic root is normal in size and structure.  Venous: The inferior vena cava is dilated in size with greater than 50% respiratory variability, suggesting right atrial pressure of 8 mmHg.  IAS/Shunts: No atrial level shunt detected by color flow Doppler.  Additional Comments: 3D imaging was not performed.   LEFT VENTRICLE PLAX 2D LVIDd:         4.50 cm   Diastology LVIDs:         3.20 cm   LV e' medial:    8.59 cm/s LV PW:         1.60 cm   LV E/e' medial:  16.3 LV IVS:        1.60 cm   LV e' lateral:   13.80 cm/s LVOT diam:     2.20 cm   LV E/e' lateral: 10.1 LV SV:         58 LV SV Index:   23 LVOT Area:     3.80 cm   RIGHT VENTRICLE             IVC RV Basal diam:  5.50 cm     IVC diam: 2.20 cm RV S prime:     13.67 cm/s TAPSE (M-mode): 2.2 cm  LEFT ATRIUM              Index        RIGHT ATRIUM           Index LA diam:        4.90 cm  1.97 cm/m   RA Area:     19.60 cm LA Vol (A2C):   83.6 ml  33.58 ml/m  RA Volume:   57.00 ml  22.90 ml/m LA Vol (A4C):   103.0 ml 41.38 ml/m LA Biplane Vol: 97.0 ml  38.96 ml/m AORTIC VALVE AV Area (Vmax):    0.83 cm AV Area (Vmean):   0.84 cm AV Area (VTI):     0.92 cm AV Vmax:           370.00 cm/s AV Vmean:          255.200 cm/s AV VTI:            0.624 m AV Peak Grad:      54.8 mmHg AV Mean Grad:      31.5 mmHg LVOT Vmax:         81.10 cm/s LVOT Vmean:        56.100 cm/s LVOT VTI:          0.152 m LVOT/AV VTI ratio: 0.24 AI PHT:            278 msec  AORTA Ao Root diam: 3.50 cm Ao Asc diam:  3.60 cm  MR Peak grad: 139.2 mmHg MR Vmax:      590.00 cm/s   SHUNTS MV E velocity: 139.67 cm/s  Systemic VTI:  0.15 m Systemic Diam: 2.20 cm  Chilton Si MD Electronically signed by Chilton Si MD Signature Date/Time: 08/08/2023/9:48:49 AM    Final           ______________________________________________________________________________________________      09/18/2022: Magnesium 1.7 11/20/2022: TSH 2.58 03/20/2023: ALT 11 08/12/2023: BUN 21; Creatinine, Ser 1.32; Platelets 247 08/16/2023: Hemoglobin 11.2; Potassium 4.3; Sodium 139   STS RISK CALCULATOR: Pending  NHYA CLASS: ***    ASSESSMENT AND  PLAN:   1. Nonrheumatic aortic valve stenosis   2. Type 2 diabetes mellitus with complication, without long-term current use of insulin (HCC)   3. Hypertension associated with diabetes (HCC)   4. Hyperlipidemia associated with type 2 diabetes mellitus (HCC)   5. CKD stage 3 secondary to diabetes (HCC)   6. PAF (paroxysmal atrial fibrillation) (HCC)   7. Secondary hypercoagulable state (HCC)     Aortic stenosis: Severe on most recent echocardiogram with a DI of less than 0.25.  Patient had coronary angiography which demonstrated no obstructive coronary artery disease. T2DM: Continue Xarelto 20 mg daily instead of aspirin, pravastatin 10 mg daily, lisinopril 10 mg daily Hypertension: Continue lisinopril 10 mg daily and hydrochlorothiazide 12.5 mg daily.*** Hyperlipidemia: Continue pravastatin 10 mg daily; followed by patient's primary cardiologist. CKD stage III: Continue lisinopril 10 mg daily and consider SGLT2 inhibitor in the future. PAF: Continue Xarelto 20 mg daily.*** Secondary hypercoagulable state: Continue Xarelto 20 mg daily.   I have personally reviewed the patients imaging data as summarized above.  I have reviewed the natural history of aortic stenosis with the patient and family members who are present today. We have discussed the limitations of medical therapy and the poor prognosis associated with symptomatic aortic stenosis. We have also reviewed potential treatment options, including palliative medical therapy, conventional surgical aortic valve replacement, and transcatheter aortic valve replacement. We discussed treatment  options in the context of this patient's specific comorbid medical conditions.   All of the patient's questions were answered today. Will make further recommendations based on the results of studies outlined above.   I spent *** minutes reviewing all clinical data during and prior to this visit including all relevant imaging studies, laboratories, clinical information from other health systems and prior notes from both Cardiology and other specialties, interviewing the patient, conducting a complete physical examination, and coordinating care in order to formulate a comprehensive and personalized evaluation and treatment plan.   Orbie Pyo, MD  08/19/2023 1:16 PM    [facility] [address] [place], [place], [place]  [address] Phone: [phone]; Fax: [phone]

## 2023-08-20 LAB — POCT I-STAT EG7
Acid-base deficit: 1 mmol/L (ref 0.0–2.0)
Bicarbonate: 24.5 mmol/L (ref 20.0–28.0)
Calcium, Ion: 1.2 mmol/L (ref 1.15–1.40)
HCT: 34 % — ABNORMAL LOW (ref 39.0–52.0)
Hemoglobin: 11.6 g/dL — ABNORMAL LOW (ref 13.0–17.0)
O2 Saturation: 67 %
Potassium: 4.3 mmol/L (ref 3.5–5.1)
Sodium: 139 mmol/L (ref 135–145)
TCO2: 26 mmol/L (ref 22–32)
pCO2, Ven: 41.3 mmHg — ABNORMAL LOW (ref 44–60)
pH, Ven: 7.38 (ref 7.25–7.43)
pO2, Ven: 36 mmHg (ref 32–45)

## 2023-08-21 ENCOUNTER — Encounter: Payer: Self-pay | Admitting: Internal Medicine

## 2023-08-21 ENCOUNTER — Ambulatory Visit: Attending: Internal Medicine | Admitting: Internal Medicine

## 2023-08-21 DIAGNOSIS — I152 Hypertension secondary to endocrine disorders: Secondary | ICD-10-CM

## 2023-08-21 DIAGNOSIS — I350 Nonrheumatic aortic (valve) stenosis: Secondary | ICD-10-CM

## 2023-08-21 DIAGNOSIS — N183 Chronic kidney disease, stage 3 unspecified: Secondary | ICD-10-CM

## 2023-08-21 DIAGNOSIS — E785 Hyperlipidemia, unspecified: Secondary | ICD-10-CM

## 2023-08-21 DIAGNOSIS — D6869 Other thrombophilia: Secondary | ICD-10-CM

## 2023-08-21 DIAGNOSIS — E1159 Type 2 diabetes mellitus with other circulatory complications: Secondary | ICD-10-CM

## 2023-08-21 DIAGNOSIS — E118 Type 2 diabetes mellitus with unspecified complications: Secondary | ICD-10-CM

## 2023-08-21 DIAGNOSIS — I480 Paroxysmal atrial fibrillation: Secondary | ICD-10-CM

## 2023-08-21 DIAGNOSIS — E1122 Type 2 diabetes mellitus with diabetic chronic kidney disease: Secondary | ICD-10-CM

## 2023-08-21 DIAGNOSIS — E1169 Type 2 diabetes mellitus with other specified complication: Secondary | ICD-10-CM | POA: Diagnosis not present

## 2023-08-21 NOTE — Patient Instructions (Signed)
 Medication Instructions:  No changes *If you need a refill on your cardiac medications before your next appointment, please call your pharmacy*   Lab Work: none If you have labs (blood work) drawn today and your tests are completely normal, you will receive your results only by: MyChart Message (if you have MyChart) OR A paper copy in the mail If you have any lab test that is abnormal or we need to change your treatment, we will call you to review the results.   Testing/Procedures: ECHO DUE IN SEPT 2025 Your physician has requested that you have an echocardiogram. Echocardiography is a painless test that uses sound waves to create images of your heart. It provides your doctor with information about the size and shape of your heart and how well your heart's chambers and valves are working. This procedure takes approximately one hour. There are no restrictions for this procedure. Please do NOT wear cologne, perfume, aftershave, or lotions (deodorant is allowed). Please arrive 15 minutes prior to your appointment time.  Please note: We ask at that you not bring children with you during ultrasound (echo/ vascular) testing. Due to room size and safety concerns, children are not allowed in the ultrasound rooms during exams. Our front office staff cannot provide observation of children in our lobby area while testing is being conducted. An adult accompanying a patient to their appointment will only be allowed in the ultrasound room at the discretion of the ultrasound technician under special circumstances. We apologize for any inconvenience.    Follow-Up: At [facility], you and your health needs are our priority.  As part of our continuing mission to provide you with exceptional heart care, we have created designated Provider Care Teams.  These Care Teams include your primary Cardiologist (physician) and Advanced Practice Providers (APPs -  Physician Assistants and Nurse Practitioners) who  all work together to provide you with the care you need, when you need it.   Your next appointment:   6 month(s)  Provider:   Alverda Skeans, MD

## 2023-09-01 NOTE — Progress Notes (Unsigned)
 Cardiology Office Note    Patient Name: Dillon Jones Date of Encounter: 09/01/2023  Primary Care Provider:  Jeani Sow, MD Primary Cardiologist:  Dillon Sprague, MD (Inactive) Primary Electrophysiologist: None   Past Medical History    Past Medical History:  Diagnosis Date   Anemia    Arthritis    Diabetes mellitus    Has had for 13 years, type 2   Diabetes mellitus-type II 02/06/2012   GERD (gastroesophageal reflux disease)    Occasional - only 1x q73m   H/O umbilical hernia repair-mesh used; done in [place] 02/06/2012   Hypercholesteremia 02/06/2012   Hyperlipidemia    Hypertension    Hypothyroidism    Lap Roux Y Gastric Bypass Dec 2013 06/09/2012   Morbid obesity (HCC)    Morbid obesity with BMI of 55.8 06/05/2012   Neuromuscular disorder (HCC)    Neuropathy   OSA on CPAP 02/06/2012   Prostate cancer (HCC)    radioactive seeds   Sleep apnea     History of Present Illness  Dillon Jones is a 73 y.o. male with a PMH of paroxysmal AF (on Xarelto), HTN, HLD, GERD, DM type II, OSA (on CPAP), moderate AS, morbid obesity s/p gastric bypass 2013 who presents today for one month follow-up.  Mr. Dillon Jones was previously followed by Dr. Shari Jones and is now currently followed by Dr. Enid Jones for management of aortic stenosis.  He was seen initially in 2020 by referral of PCP for aortic stenosis.  2D echo at that time showed EF of 65 to 70% with mean gradient of 23 mmHg.  He was seen in follow-up on 09/2022 for surgical clearance and underwent Lexiscan review that was normal.  He also completed 2D echo that showed EF of 60 to 65% with grade 1 DD and mild MVR with moderate AS with mean gradient of 33 mmHg.  He was seen by Dr. Enid Jones on 08/05/2022 following ED visit for chest pain.  He described feeling of shortness of breath and tightness in his chest towards the end of taking a deep breath.  Due to his history of AS and current symptoms patient underwent R/LHC that was  complete and on 08/16/2023 that showed normal coronaries with severe AS and WHO group 2 severe PHT with elevated filling pressures.  He was referred to the structural heart clinic and was seen by Dr. Lynnette Jones for consultation on 08/21/2023.  He was advised to resume normal activities and was scheduled to follow-up in 6 months.  Mr. Dillon Jones presents today for 1 month follow-up.  He reports since his previous visit that he has not experienced any palpitations or perceived episodes of atrial fibrillation.  His blood pressure today was initially elevated at 144/84 and was 132/82 on recheck.  He reports since his previous follow-up he has resumed normal activities, including gym visits, focusing on leg exercises and riding an exercise bike for about fifteen minutes without experiencing shortness of breath. He feels 'fairly normal' and has not experienced further episodes of chest pain or shortness of breath. He is currently on Xarelto and lisinopril, with blood pressure readings typically in the 130s at home. He has not had any emergency department visits since the last follow-up. He continues to use his CPAP machine, which he has been using for decades, and finds it beneficial for his heart and blood pressure. No palpitations, tachycardia, skipped beats, or rhythm issues since his last visit. No current chest pain, shortness of breath, lightheadedness, or dizziness. Patient  denies chest pain, palpitations, dyspnea, PND, orthopnea, nausea, vomiting, dizziness, syncope, edema, weight gain, or early satiety.  Discussed the use of AI scribe software for clinical note transcription with the patient, who gave verbal consent to proceed.  History of Present Illness   Review of Systems  Please see the history of present illness.    All other systems reviewed and are otherwise negative except as noted above.  Physical Exam    Wt Readings from Last 3 Encounters:  08/21/23 291 lb 12.8 oz (132.4 kg)  08/16/23 290 lb  (131.5 kg)  08/06/23 295 lb 9.6 oz (134.1 kg)   ZO:XWRUE were no vitals filed for this visit.,There is no height or weight on file to calculate BMI. GEN: Well nourished, well developed in no acute distress Neck: No JVD; No carotid bruits Pulmonary: Clear to auscultation without rales, wheezing or rhonchi  Cardiovascular: Normal rate. Regular rhythm. Normal S1. Normal S2.   Murmurs: There is no murmur.  ABDOMEN: Soft, non-tender, non-distended EXTREMITIES:  No edema; No deformity   EKG/LABS/ Recent Cardiac Studies   ECG personally reviewed by me today -none completed today  Risk Assessment/Calculations:    CHA2DS2-VASc Score = 3   This indicates a 3.2% annual risk of stroke. The patient's score is based upon: CHF History: 0 HTN History: 1 Diabetes History: 1 Stroke History: 0 Vascular Disease History: 0 Age Score: 1 Gender Score: 0         Lab Results  Component Value Date   WBC 5.6 08/12/2023   HGB 11.2 (L) 08/16/2023   HCT 33.0 (L) 08/16/2023   MCV 77 (L) 08/12/2023   PLT 247 08/12/2023   Lab Results  Component Value Date   CREATININE 1.32 (H) 08/12/2023   BUN 21 08/12/2023   NA 139 08/16/2023   K 4.3 08/16/2023   CL 103 08/12/2023   CO2 23 08/12/2023   Lab Results  Component Value Date   CHOL 196 11/20/2022   HDL 104.60 11/20/2022   LDLCALC 73 11/20/2022   TRIG 96.0 11/20/2022   CHOLHDL 2 11/20/2022    Lab Results  Component Value Date   HGBA1C 5.5 03/20/2023   Assessment & Plan    1.  Severe nonrheumatic AS: -2D echo completed 07/2023 showing EF of 50-55% with severe dilated LA, trivial MR and moderate to severe aortic stenosis -Patient reports he has not experienced any presyncope or chest pain since his previous follow-up. -He has resumed normal activity and  -He has a 42-month echocardiogram scheduled before his before six-month follow-up with Dr. Lynnette Jones  2.  Paroxysmal AF: -Today patient is sinus rhythm today with rate of 84 bpm. -Patient's  creatinine clearance is 99 mL a minute -Continue Xarelto 20 mg  -Patient will wear 30-day event monitor to assess AF burden -Patient advised to avoid triggers such as stress, alcohol, caffeine   3.  Essential hypertension: -Patient's blood pressure today was stable at 144/84 and was 132/82 on recheck. -Continue lisinopril 10 mg and HCTZ 12.5 mg daily  4.  Hyperlipidemia: -Patient's last LDL cholesterol was 73 -Continue pravastatin 10 mg daily -Continue physical activity and low-sodium heart healthy diet.  5.  Morbid obesity: -Patient's BMI is 41.51 kg/m -Patient may be candidate for GLP-1 in the future and is currently resuming physical activity.    Disposition: Follow-up with Dillon Sprague, MD (Inactive) or APP in 3 months    Signed, Napoleon Form, Leodis Rains, NP 09/01/2023, 5:19 PM [facility]

## 2023-09-03 ENCOUNTER — Ambulatory Visit: Attending: Cardiology

## 2023-09-03 ENCOUNTER — Encounter: Payer: Self-pay | Admitting: Nurse Practitioner

## 2023-09-03 ENCOUNTER — Ambulatory Visit: Payer: PPO | Attending: Nurse Practitioner | Admitting: Nurse Practitioner

## 2023-09-03 VITALS — BP 132/82 | HR 84 | Ht 71.0 in | Wt 297.6 lb

## 2023-09-03 DIAGNOSIS — E118 Type 2 diabetes mellitus with unspecified complications: Secondary | ICD-10-CM | POA: Diagnosis not present

## 2023-09-03 DIAGNOSIS — I480 Paroxysmal atrial fibrillation: Secondary | ICD-10-CM

## 2023-09-03 DIAGNOSIS — I350 Nonrheumatic aortic (valve) stenosis: Secondary | ICD-10-CM | POA: Diagnosis not present

## 2023-09-03 DIAGNOSIS — E1159 Type 2 diabetes mellitus with other circulatory complications: Secondary | ICD-10-CM

## 2023-09-03 DIAGNOSIS — I152 Hypertension secondary to endocrine disorders: Secondary | ICD-10-CM

## 2023-09-03 NOTE — Progress Notes (Unsigned)
 [place] event serial # [id number] from office inventory applied to patient.

## 2023-09-03 NOTE — Patient Instructions (Signed)
 Medication Instructions:  Your physician recommends that you continue on your current medications as directed. Please refer to the Current Medication list given to you today. *If you need a refill on your cardiac medications before your next appointment, please call your pharmacy*   Lab Work: None Ordered If you have labs (blood work) drawn today and your tests are completely normal, you will receive your results only by: MyChart Message (if you have MyChart) OR A paper copy in the mail If you have any lab test that is abnormal or we need to change your treatment, we will call you to review the results.   Testing/Procedures: Your physician has recommended that you wear an 30 days event monitor. Event monitors are medical devices that record the heart's electrical activity. Doctors most often [place] these monitors to diagnose arrhythmias. Arrhythmias are problems with the speed or rhythm of the heartbeat. The monitor is a small, portable device. You can wear one while you do your normal daily activities. This is usually used to diagnose what is causing palpitations/syncope (passing out).   Follow-Up: At [facility], you and your health needs are our priority.  As part of our continuing mission to provide you with exceptional heart care, we have created designated Provider Care Teams.  These Care Teams include your primary Cardiologist (physician) and Advanced Practice Providers (APPs -  Physician Assistants and Nurse Practitioners) who all work together to provide you with the care you need, when you need it.  We recommend signing up for the patient portal called "MyChart".  Sign up information is provided on this After Visit Summary.  MyChart is used to connect with patients for Virtual Visits (Telemedicine).  Patients are able to view lab/test results, encounter notes, upcoming appointments, etc.  Non-urgent messages can be sent to your provider as well.   To learn more about what you can  do with MyChart, go to [email or web address].    Your next appointment:   3 month(s)  Provider:   Robin Searing, NP      Other Instructions

## 2023-09-04 ENCOUNTER — Encounter: Payer: Self-pay | Admitting: Family Medicine

## 2023-09-18 ENCOUNTER — Ambulatory Visit: Payer: PPO | Admitting: Family Medicine

## 2023-09-20 ENCOUNTER — Ambulatory Visit: Payer: PPO | Admitting: Family Medicine

## 2023-09-20 ENCOUNTER — Encounter: Payer: Self-pay | Admitting: Family Medicine

## 2023-09-20 VITALS — BP 133/66 | HR 75 | Temp 97.5°F | Resp 18 | Ht 71.0 in | Wt 293.4 lb

## 2023-09-20 DIAGNOSIS — I350 Nonrheumatic aortic (valve) stenosis: Secondary | ICD-10-CM | POA: Diagnosis not present

## 2023-09-20 DIAGNOSIS — E039 Hypothyroidism, unspecified: Secondary | ICD-10-CM

## 2023-09-20 DIAGNOSIS — E7800 Pure hypercholesterolemia, unspecified: Secondary | ICD-10-CM | POA: Diagnosis not present

## 2023-09-20 DIAGNOSIS — Z7984 Long term (current) use of oral hypoglycemic drugs: Secondary | ICD-10-CM | POA: Diagnosis not present

## 2023-09-20 DIAGNOSIS — E119 Type 2 diabetes mellitus without complications: Secondary | ICD-10-CM | POA: Diagnosis not present

## 2023-09-20 DIAGNOSIS — Z9884 Bariatric surgery status: Secondary | ICD-10-CM | POA: Diagnosis not present

## 2023-09-20 DIAGNOSIS — C61 Malignant neoplasm of prostate: Secondary | ICD-10-CM | POA: Diagnosis not present

## 2023-09-20 DIAGNOSIS — I10 Essential (primary) hypertension: Secondary | ICD-10-CM | POA: Diagnosis not present

## 2023-09-20 LAB — MICROALBUMIN / CREATININE URINE RATIO
Creatinine,U: 159.1 mg/dL
Microalb Creat Ratio: 41.4 mg/g — ABNORMAL HIGH (ref 0.0–30.0)
Microalb, Ur: 6.6 mg/dL — ABNORMAL HIGH (ref 0.0–1.9)

## 2023-09-20 LAB — LIPID PANEL
Cholesterol: 192 mg/dL (ref 0–200)
HDL: 101 mg/dL (ref >39.00)
LDL Cholesterol: 75 mg/dL (ref 0–99)
NonHDL: 90.89
Total CHOL/HDL Ratio: 2
Triglycerides: 77 mg/dL (ref 0.0–149.0)
VLDL: 15.4 mg/dL (ref 0.0–40.0)

## 2023-09-20 LAB — CBC WITH DIFFERENTIAL/PLATELET
Basophils Absolute: 0 10*3/uL (ref 0.0–0.1)
Basophils Relative: 1 % (ref 0.0–3.0)
Eosinophils Absolute: 0.2 10*3/uL (ref 0.0–0.7)
Eosinophils Relative: 4.4 % (ref 0.0–5.0)
HCT: 36.8 % — ABNORMAL LOW (ref 39.0–52.0)
Hemoglobin: 11.2 g/dL — ABNORMAL LOW (ref 13.0–17.0)
Lymphocytes Relative: 15.8 % (ref 12.0–46.0)
Lymphs Abs: 0.7 10*3/uL (ref 0.7–4.0)
MCHC: 30.6 g/dL (ref 30.0–36.0)
MCV: 73.9 fl — ABNORMAL LOW (ref 78.0–100.0)
Monocytes Absolute: 0.5 10*3/uL (ref 0.1–1.0)
Monocytes Relative: 12.4 % — ABNORMAL HIGH (ref 3.0–12.0)
Neutro Abs: 2.9 10*3/uL (ref 1.4–7.7)
Neutrophils Relative %: 66.4 % (ref 43.0–77.0)
Platelets: 212 10*3/uL (ref 150.0–400.0)
RBC: 4.97 Mil/uL (ref 4.22–5.81)
RDW: 18.5 % — ABNORMAL HIGH (ref 11.5–15.5)
WBC: 4.3 10*3/uL (ref 4.0–10.5)

## 2023-09-20 LAB — COMPREHENSIVE METABOLIC PANEL WITH GFR
ALT: 14 U/L (ref 0–53)
AST: 16 U/L (ref 0–37)
Albumin: 4.1 g/dL (ref 3.5–5.2)
Alkaline Phosphatase: 57 U/L (ref 39–117)
BUN: 27 mg/dL — ABNORMAL HIGH (ref 6–23)
CO2: 26 meq/L (ref 19–32)
Calcium: 9.4 mg/dL (ref 8.4–10.5)
Chloride: 102 meq/L (ref 96–112)
Creatinine, Ser: 1.26 mg/dL (ref 0.40–1.50)
GFR: 57.73 mL/min — ABNORMAL LOW (ref >60.00)
Glucose, Bld: 106 mg/dL — ABNORMAL HIGH (ref 70–99)
Potassium: 4.5 meq/L (ref 3.5–5.1)
Sodium: 136 meq/L (ref 135–145)
Total Bilirubin: 0.5 mg/dL (ref 0.2–1.2)
Total Protein: 7.2 g/dL (ref 6.0–8.3)

## 2023-09-20 LAB — IBC + FERRITIN
Ferritin: 9.1 ng/mL — ABNORMAL LOW (ref 22.0–322.0)
Iron: 33 ug/dL — ABNORMAL LOW (ref 42–165)
Saturation Ratios: 6.1 % — ABNORMAL LOW (ref 20.0–50.0)
TIBC: 539 ug/dL — ABNORMAL HIGH (ref 250.0–450.0)
Transferrin: 385 mg/dL — ABNORMAL HIGH (ref 212.0–360.0)

## 2023-09-20 LAB — HEMOGLOBIN A1C: Hgb A1c MFr Bld: 6.2 % (ref 4.6–6.5)

## 2023-09-20 LAB — VITAMIN D 25 HYDROXY (VIT D DEFICIENCY, FRACTURES): VITD: 20.47 ng/mL — ABNORMAL LOW (ref 30.00–100.00)

## 2023-09-20 LAB — VITAMIN B12: Vitamin B-12: 200 pg/mL — ABNORMAL LOW (ref 211–911)

## 2023-09-20 LAB — TSH: TSH: 5.82 u[IU]/mL — ABNORMAL HIGH (ref 0.35–5.50)

## 2023-09-20 NOTE — Patient Instructions (Signed)
 It was very nice to see you today!  Decrease alcohol Get eye exam done and sent to me   PLEASE NOTE:  If you had any lab tests please let us know if you have not heard back within a few days. You may see your results on MyChart before we have a chance to review them but we will give you a call once they are reviewed by [place]. If we ordered any referrals today, please let us know if you have not heard from their office within the next week.   Please try these tips to maintain a healthy lifestyle:  Eat most of your calories during the day when you are active. Eliminate processed foods including packaged sweets (pies, cakes, cookies), reduce intake of potatoes, white bread, white pasta, and white rice. Look for whole grain options, oat flour or almond flour.  Each meal should contain half fruits/vegetables, one quarter protein, and one quarter carbs (no bigger than a computer mouse).  Cut down on sweet beverages. This includes juice, soda, and sweet tea. Also watch fruit intake, though this is a healthier sweet option, it still contains natural sugar! Limit to 3 servings daily.  Drink at least 1 glass of water with each meal and aim for at least 8 glasses per day  Exercise at least 150 minutes every week.

## 2023-09-20 NOTE — Progress Notes (Signed)
 Subjective:     Patient ID: Dillon Jones, male    DOB: November 15, 1950, 73 y.o.   MRN: [id number]  Chief Complaint  Patient presents with   Medical Management of Chronic Issues    6 month follow-up on htn, dm Not fasting   HPI DM type 2- Pt is on metformin 1000 mg tablet daily. Sugars are running unchecked. Reports a low carbohydrate diet. Not exercising.  Gastric bypass in past   HTN - Pt is on hydrochlorothiazide 12.5 mg and lisinopril 10 mg daily.  Bp's running unchecked. No ha/dizziness/cp/palp/edema/cough/sob. Dillon Jones  Hypothyroidism.  On synthroid 200-doing well.  Discussed the use of AI scribe software for clinical note transcription with the patient, who gave verbal consent to proceed.  History of Present Illness Dillon Jones is a 73 year old male with aortic valve disease and atrial fibrillation who presents for follow-up on cardiac health and diabetes management.   He has a history of aortic valve disease, which has been worsening. A recent echocardiogram indicated deterioration of the aortic valve. He underwent a left and right heart catheterization, which showed normal coronary vessels. Initially, he was in atrial fibrillation but returned to normal sinus rhythm post-catheterization. He is currently on a 30-day cardiac event monitor and has not experienced any atrial fibrillation events in the first two weeks. He is taking Xarelto for atrial fibrillation. Followed by Card  He has diabetes and is currently taking metformin 1000 mg once daily. He follows a low carbohydrate diet and has lost 160 pounds since his gastric bypass surgery. His A1c is 5.5. He occasionally experiences yeast infections in skin creases.  He is on hydrochlorothiazide 12.5 mg and lisinopril 10 mg for blood pressure management. He has not been checking his blood pressure at home but reports that it has been stable during recent doctor visits. No headaches, dizziness, chest pain, shortness of breath, or  coughing.  He takes Synthroid 200 mcg for thyroid management and pravastatin for cholesterol. He has not had a recent eye exam and plans to schedule one soon.  He reports excessive alcohol consumption, approximately four drinks most evenings, but intends to reduce intake, especially while caring for his wife.  He has a history of prostate cancer and is monitored by a urologist. Recent tests showed blood in the urine and a large kidney stone, which is being monitored. No nausea, vomiting, or diarrhea.    Health Maintenance Due  Topic Date Due   OPHTHALMOLOGY EXAM  03/27/2023   Medicare Annual Wellness (AWV)  10/18/2023    Past Medical History:  Diagnosis Date   Anemia    Arthritis    Diabetes mellitus    Has had for 13 years, type 2   Diabetes mellitus-type II 02/06/2012   GERD (gastroesophageal reflux disease)    Occasional - only 1x q32m   H/O umbilical hernia repair-mesh used; done in [place] 02/06/2012   Heart murmur    Hypercholesteremia 02/06/2012   Hyperlipidemia    Hypertension    Hypothyroidism    Lap Roux Y Gastric Bypass Dec 2013 06/09/2012   Morbid obesity (HCC)    Morbid obesity with BMI of 55.8 06/05/2012   Neuromuscular disorder (HCC)    Neuropathy   OSA on CPAP 02/06/2012   Prostate cancer (HCC)    radioactive seeds   Sleep apnea     Past Surgical History:  Procedure Laterality Date   BREATH TEK H PYLORI  04/22/2012   Procedure: BREATH TEK H PYLORI;  Surgeon: Azucena Bollard, MD;  Location: [facility];  Service: General;  Laterality: N/A;   CARDIAC CATHETERIZATION     GASTRIC ROUX-EN-Y  06/09/2012   Procedure: LAPAROSCOPIC ROUX-EN-Y GASTRIC;  Surgeon: Azucena Bollard, MD;  Location: [facility];  Service: General;  Laterality: N/A;   HERNIA REPAIR  1992   umbilical   PROSTATE BIOPSY     RADIOACTIVE SEED IMPLANT N/A 12/09/2018   Procedure: RADIOACTIVE SEED IMPLANT/BRACHYTHERAPY IMPLANT;  Surgeon: Homero Luster, MD;  Location: [facility];  Service: Urology;   Laterality: N/A;   right knee arthroscopy  1999   RIGHT/LEFT HEART CATH AND CORONARY ANGIOGRAPHY N/A 08/16/2023   Procedure: RIGHT/LEFT HEART CATH AND CORONARY ANGIOGRAPHY;  Surgeon: Cody Das, MD;  Location: [facility];  Service: Cardiovascular;  Laterality: N/A;   SPACE OAR INSTILLATION N/A 12/09/2018   Procedure: SPACE OAR INSTILLATION;  Surgeon: Homero Luster, MD;  Location: [facility];  Service: Urology;  Laterality: N/A;   TOTAL KNEE ARTHROPLASTY Right 12/18/2022   Procedure: TOTAL KNEE ARTHROPLASTY;  Surgeon: Osa Blase, MD;  Location: [facility];  Service: Orthopedics;  Laterality: Right;   TOTAL KNEE ARTHROPLASTY Left 04/23/2023   Procedure: LEFT TOTAL KNEE ARTHROPLASTY;  Surgeon: Osa Blase, MD;  Location: [facility];  Service: Orthopedics;  Laterality: Left;   VENTRAL HERNIA REPAIR  06/09/2012   Procedure: LAPAROSCOPIC VENTRAL HERNIA;  Surgeon: Azucena Bollard, MD;  Location: [facility];  Service: General;;  primary repair of ventral hernia     Current Outpatient Medications:    CALCIUM PO, Take 2 tablets by mouth daily., Disp: , Rfl:    hydrochlorothiazide (MICROZIDE) 12.5 MG capsule, TAKE 1 CAPSULE BY MOUTH EVERY DAY, Disp: 90 capsule, Rfl: 1   ketoconazole (NIZORAL) 2 % cream, Apply 1 Application topically 2 (two) times daily. (Patient taking differently: Apply 1 Application topically daily as needed for irritation.), Disp: 60 g, Rfl: 2   levothyroxine (SYNTHROID) 200 MCG tablet, TAKE 1 TABLET BY MOUTH EVERY DAY EVERYDAY EXCEPT MONDAY AND FRIDAY. TAKE 2 TIMES DAILY ON MONDAY AND FRIDAY, Disp: 90 tablet, Rfl: 1   lisinopril (ZESTRIL) 10 MG tablet, TAKE 1 TABLET BY MOUTH EVERY DAY, Disp: 90 tablet, Rfl: 1   metFORMIN (GLUCOPHAGE) 1000 MG tablet, Take 1 tablet (1,000 mg total) by mouth daily with breakfast., Disp: 90 tablet, Rfl: 1   Multiple Vitamins-Minerals (BARIATRIC MULTIVITAMINS) CAPS, Take 2 capsules by mouth daily., Disp: , Rfl:    pravastatin (PRAVACHOL) 10 MG  tablet, TAKE 1 TABLET BY MOUTH EVERY DAY, Disp: 90 tablet, Rfl: 1   rivaroxaban (XARELTO) 20 MG TABS tablet, Take 1 tablet (20 mg total) by mouth daily with supper., Disp: 30 tablet, Rfl: 6  No Known Allergies ROS neg/noncontributory except as noted HPI/below      Objective:     BP 133/66   Pulse 75   Temp (!) 97.5 F (36.4 C) (Temporal)   Resp 18   Ht 5\' 11"  (1.803 m)   Wt 293 lb 6 oz (133.1 kg)   SpO2 99%   BMI 40.92 kg/m  Wt Readings from Last 3 Encounters:  09/20/23 293 lb 6 oz (133.1 kg)  09/03/23 297 lb 9.6 oz (135 kg)  08/21/23 291 lb 12.8 oz (132.4 kg)   Physical Exam   Gen: WDWN NAD HEENT: NCAT, conjunctiva not injected, sclera nonicteric NECK:  supple, no thyromegaly, no nodes, no carotid bruits CARDIAC: RRR, S1S2+. DP 2+B +2/6 systolic murmur LUNGS: CTAB. No wheezes ABDOMEN:  BS+, soft,  NTND, No HSM, no masses umbilical hernia EXT:  no edema MSK: no gross abnormalities.  NEURO: A&O x3.  CN II-XII intact.  PSYCH: normal mood. Good eye contact        Assessment & Plan:  Type 2 diabetes mellitus without complication, without long-term current use of insulin (HCC) -     Comprehensive metabolic panel with GFR -     Hemoglobin A1c -     Microalbumin / creatinine urine ratio  Primary hypertension -     Comprehensive metabolic panel with GFR -     Microalbumin / creatinine urine ratio -     CBC with Differential/Platelet  Hypercholesteremia -     Lipid panel  Acquired hypothyroidism -     TSH  Lap Roux Y Gastric Bypass Dec 2013 -     Vitamin B12 -     IBC + Ferritin -     VITAMIN D 25 Hydroxy (Vit-D Deficiency, Fractures)  Long term current use of oral hypoglycemic drug  Nonrheumatic aortic valve stenosis  Prostate cancer (HCC)  Assessment and Plan Assessment & Plan Aortic valve stenosis   Progressive worsening confirmed by recent echocardiogram, with normal coronary vessels shown in left and right heart catheterization. No immediate  symptoms present. Cardiologist anticipates valve replacement in 6 months to a year. Emphasis on reducing alcohol intake to improve overall health and prepare for potential valve replacement. Monitor with cardiologist and prepare for potential valve replacement in 6 months to a year. Decrease alcohol consumption.  Atrial fibrillation   Intermittent atrial fibrillation noted during initial cardiologist visit, currently in normal sinus rhythm. Undergoing 30-day cardiac event monitoring with no events reported in the first two weeks. Plan to discontinue Xarelto if no events occur during monitoring period. Continue 30-day cardiac event monitoring and discontinue Xarelto if no events occur.  Type 2 diabetes mellitus   Managed with metformin 1000 mg once daily and low carbohydrate diet. A1c well controlled at 5.5. Discussed potential addition of Jardiance if proteinuria persists. Consideration of Ozempic for further weight loss and kidney protection, but deferred. Informed about risk of yeast infections with Jardiance and advised on hygiene practices to mitigate risk. Continue metformin 1000 mg once daily and low carbohydrate diet. Consider Jardiance if proteinuria persists and Ozempic in the future. Maintain good hygiene to prevent yeast infections.  Chronic kidney disease   microalbuminuria indicating kidney damage likely due to diabetes. Currently on lisinopril to preserve kidney function. Potential addition of Jardiance discussed if proteinuria persists. Informed about benefits of Jardiance in preserving kidney function and its use in heart failure. Monitor proteinuria and consider Jardiance if proteinuria persists.  Hypertension   Managed with hydrochlorothiazide 12.5 mg and lisinopril 10 mg. Blood pressure well-controlled with no symptoms of headache, dizziness, chest pain, or shortness of breath. Continue hydrochlorothiazide 12.5 mg and lisinopril 10 mg.  Hyperlipidemia   Managed with pravastatin. No  recent lipid panel results discussed, but medication adherence noted. Continue pravastatin.  Hypothyroidism   Managed with Synthroid 200 mcg. No recent thyroid function test results discussed. Continue Synthroid 200 mcg.  Prostate cancer   Recent PSA levels near zero. Recent CT scan showed a large kidney stone but no other significant findings. Follow-up with urologist planned in a couple of months. Follow up with urologist in a couple of months.  General Health Maintenance   Eye exam overdue. Emphasis on maintaining good hygiene to prevent yeast infections, especially if Jardiance is started. Schedule eye exam and send results to  provider.  H/o gastric bypass.  Check labs   Follow-up   Routine follow-up planned in six months unless issues arise sooner. Prefers to access after-visit summary via MyChart. Schedule follow-up appointment in six months and access after-visit summary via MyChart.    Return in about 6 months (around 03/21/2024) for chronic follow-up.     Ellsworth Haas, MD

## 2023-09-22 ENCOUNTER — Encounter: Payer: Self-pay | Admitting: Family Medicine

## 2023-09-22 NOTE — Progress Notes (Signed)
 No panic but please sch appt(can be virtual) to discuss labs and plan

## 2023-10-03 ENCOUNTER — Encounter: Payer: Self-pay | Admitting: Family Medicine

## 2023-10-03 ENCOUNTER — Ambulatory Visit (INDEPENDENT_AMBULATORY_CARE_PROVIDER_SITE_OTHER): Admitting: Family Medicine

## 2023-10-03 VITALS — BP 122/55 | HR 76 | Temp 97.8°F | Resp 18 | Ht 71.0 in | Wt 297.5 lb

## 2023-10-03 DIAGNOSIS — Z7984 Long term (current) use of oral hypoglycemic drugs: Secondary | ICD-10-CM | POA: Diagnosis not present

## 2023-10-03 DIAGNOSIS — E559 Vitamin D deficiency, unspecified: Secondary | ICD-10-CM

## 2023-10-03 DIAGNOSIS — E119 Type 2 diabetes mellitus without complications: Secondary | ICD-10-CM

## 2023-10-03 DIAGNOSIS — E538 Deficiency of other specified B group vitamins: Secondary | ICD-10-CM | POA: Diagnosis not present

## 2023-10-03 DIAGNOSIS — Z9884 Bariatric surgery status: Secondary | ICD-10-CM

## 2023-10-03 DIAGNOSIS — D508 Other iron deficiency anemias: Secondary | ICD-10-CM

## 2023-10-03 DIAGNOSIS — E039 Hypothyroidism, unspecified: Secondary | ICD-10-CM | POA: Diagnosis not present

## 2023-10-03 MED ORDER — EMPAGLIFLOZIN 10 MG PO TABS
10.0000 mg | ORAL_TABLET | Freq: Every day | ORAL | 1 refills | Status: DC
Start: 2023-10-03 — End: 2023-11-18

## 2023-10-03 NOTE — Patient Instructions (Signed)
 Starting Jardiance   Take the thyroid  regularly Get the Bariatric vitamins

## 2023-10-03 NOTE — Progress Notes (Signed)
 Subjective:     Patient ID: Dillon Jones, male    DOB: June 12, 1950, 73 y.o.   MRN: [id number]  Chief Complaint  Patient presents with   Medication Follow-up    Discuss recent lab results and medications Not fasting     HPI Microalbuminuria Anemia-colon 10/03/20 Thy-on 200,  B12 low, D low  Discussed the use of AI scribe software for clinical note transcription with the patient, who gave verbal consent to proceed.  History of Present Illness Dillon Jones is a 72 year old male with diabetes who presents for follow-up on lab results and medication management.  His HbA1c has increased to the low 6s from the mid 5s, which was unexpected. He attributes this change to a decrease in physical activity due to caring for his wife post-surgery and possible dietary indiscretions. He has not been exercising regularly since his wife's knee surgery but plans to resume now that she is recovering. He consumes alcohol, which may contribute to his weight and blood sugar levels.- has discussed options such as Jardiance  and Ozempic. Will do jardiance  d/t microalbuminuria and pt preference  He has a history of gastric bypass surgery and has experienced significant weight loss but acknowledges there is more weight to lose. He is currently not taking any specific vitamins-just didn't re-order but will now.  Regarding his thyroid , he has been on a high dose of thyroid  medication (200 mcg daily) but misses doses occasionally. He has been more diligent in the past few weeks but was inconsistent the month prior. His thyroid  levels were slightly off, which is unusual for him.  He has a history of anemia, which may be multifactorial. He had a normal colonoscopy in 2022 and is not experiencing any dark tarry stools. He has a history of hematuria, which his urologist is investigating. He has a large kidney stone that is not causing obstruction. He is not currently taking bariatric vitamins, which may contribute to  his anemia, along with his history of gastric bypass and recent surgeries.  He reports low vitamin B12 and vitamin D  levels. He has not been taking bariatric vitamins recently. His metformin  use and gastric bypass can affect B12 absorption. He has experienced low B12 levels in the past, which have been monitored.  He is on Xarelto  but has not experienced any bleeding events. He recently completed a 30-day cardiac monitor, which showed no events, and he has remained in sinus rhythm.    Health Maintenance Due  Topic Date Due   OPHTHALMOLOGY EXAM  03/27/2023   Medicare Annual Wellness (AWV)  10/18/2023    Past Medical History:  Diagnosis Date   Anemia    Arthritis    Diabetes mellitus    Has had for 13 years, type 2   Diabetes mellitus-type II 02/06/2012   GERD (gastroesophageal reflux disease)    Occasional - only 1x q15m   H/O umbilical hernia repair-mesh used; done in [place] 02/06/2012   Heart murmur    Hypercholesteremia 02/06/2012   Hyperlipidemia    Hypertension    Hypothyroidism    Lap [place] Y Gastric Bypass Dec 2013 06/09/2012   Morbid obesity (HCC)    Morbid obesity with BMI of 55.8 06/05/2012   Neuromuscular disorder (HCC)    Neuropathy   OSA on CPAP 02/06/2012   Prostate cancer (HCC)    radioactive seeds   Sleep apnea     Past Surgical History:  Procedure Laterality Date   BREATH TEK H PYLORI  04/22/2012  Procedure: BREATH TEK H PYLORI;  Surgeon: Azucena Bollard, MD;  Location: [facility];  Service: General;  Laterality: N/A;   CARDIAC CATHETERIZATION     GASTRIC ROUX-EN-Y  06/09/2012   Procedure: LAPAROSCOPIC ROUX-EN-Y GASTRIC;  Surgeon: Azucena Bollard, MD;  Location: [facility];  Service: General;  Laterality: N/A;   HERNIA REPAIR  1992   umbilical   PROSTATE BIOPSY     RADIOACTIVE SEED IMPLANT N/A 12/09/2018   Procedure: RADIOACTIVE SEED IMPLANT/BRACHYTHERAPY IMPLANT;  Surgeon: Homero Luster, MD;  Location: [facility];  Service: Urology;  Laterality: N/A;    right knee arthroscopy  1999   RIGHT/LEFT HEART CATH AND CORONARY ANGIOGRAPHY N/A 08/16/2023   Procedure: RIGHT/LEFT HEART CATH AND CORONARY ANGIOGRAPHY;  Surgeon: Cody Das, MD;  Location: [facility];  Service: Cardiovascular;  Laterality: N/A;   SPACE OAR INSTILLATION N/A 12/09/2018   Procedure: SPACE OAR INSTILLATION;  Surgeon: Homero Luster, MD;  Location: [facility];  Service: Urology;  Laterality: N/A;   TOTAL KNEE ARTHROPLASTY Right 12/18/2022   Procedure: TOTAL KNEE ARTHROPLASTY;  Surgeon: Osa Blase, MD;  Location: [facility];  Service: Orthopedics;  Laterality: Right;   TOTAL KNEE ARTHROPLASTY Left 04/23/2023   Procedure: LEFT TOTAL KNEE ARTHROPLASTY;  Surgeon: Osa Blase, MD;  Location: [facility];  Service: Orthopedics;  Laterality: Left;   VENTRAL HERNIA REPAIR  06/09/2012   Procedure: LAPAROSCOPIC VENTRAL HERNIA;  Surgeon: Azucena Bollard, MD;  Location: [facility];  Service: General;;  primary repair of ventral hernia     Current Outpatient Medications:    CALCIUM PO, Take 2 tablets by mouth daily., Disp: , Rfl:    empagliflozin  (JARDIANCE ) 10 MG TABS tablet, Take 1 tablet (10 mg total) by mouth daily before breakfast., Disp: 30 tablet, Rfl: 1   hydrochlorothiazide  (MICROZIDE ) 12.5 MG capsule, TAKE 1 CAPSULE BY MOUTH EVERY DAY, Disp: 90 capsule, Rfl: 1   ketoconazole  (NIZORAL ) 2 % cream, Apply 1 Application topically 2 (two) times daily. (Patient taking differently: Apply 1 Application topically daily as needed for irritation.), Disp: 60 g, Rfl: 2   levothyroxine  (SYNTHROID ) 200 MCG tablet, TAKE 1 TABLET BY MOUTH EVERY DAY EVERYDAY EXCEPT MONDAY AND FRIDAY. TAKE 2 TIMES DAILY ON MONDAY AND FRIDAY, Disp: 90 tablet, Rfl: 1   lisinopril  (ZESTRIL ) 10 MG tablet, TAKE 1 TABLET BY MOUTH EVERY DAY, Disp: 90 tablet, Rfl: 1   metFORMIN  (GLUCOPHAGE ) 1000 MG tablet, Take 1 tablet (1,000 mg total) by mouth daily with breakfast., Disp: 90 tablet, Rfl: 1   Multiple Vitamins-Minerals  (BARIATRIC MULTIVITAMINS) CAPS, Take 2 capsules by mouth daily., Disp: , Rfl:    pravastatin  (PRAVACHOL ) 10 MG tablet, TAKE 1 TABLET BY MOUTH EVERY DAY, Disp: 90 tablet, Rfl: 1   rivaroxaban  (XARELTO ) 20 MG TABS tablet, Take 1 tablet (20 mg total) by mouth daily with supper., Disp: 30 tablet, Rfl: 6  No Known Allergies ROS neg/noncontributory except as noted HPI/below      Objective:     BP (!) 122/55   Pulse 76   Temp 97.8 F (36.6 C) (Temporal)   Resp 18   Ht 5\' 11"  (1.803 m)   Wt 297 lb 8 oz (134.9 kg)   SpO2 99%   BMI 41.49 kg/m  Wt Readings from Last 3 Encounters:  10/03/23 297 lb 8 oz (134.9 kg)  09/20/23 293 lb 6 oz (133.1 kg)  09/03/23 297 lb 9.6 oz (135 kg)    Physical Exam   Gen: [facility] NAD HEENT: NCAT,  conjunctiva not injected, sclera nonicteric MSK: no gross abnormalities.  NEURO: A&O x3.  CN II-XII intact.  PSYCH: normal mood. Good eye contact  Discussed labs     Assessment & Plan:  Type 2 diabetes mellitus without complication, without long-term current use of insulin  (HCC) -     Empagliflozin ; Take 1 tablet (10 mg total) by mouth daily before breakfast.  Dispense: 30 tablet; Refill: 1 -     Basic metabolic panel with GFR; Future  Acquired hypothyroidism -     TSH; Future  Lap [place] Y Gastric Bypass Dec 2013  Iron deficiency anemia secondary to inadequate dietary iron intake -     CBC with Differential/Platelet; Future -     Vitamin B12; Future  Vitamin B12 deficiency  Vitamin D  deficiency  Assessment and Plan Assessment & Plan Type 2 diabetes mellitus with microalbuminuria   His HbA1c is elevated in the low 6s, indicating suboptimal glycemic control, and microalbuminuria suggests renal involvement. Lifestyle factors, including dietary indiscretions and lack of exercise due to caregiving responsibilities, may contribute to the elevated HbA1c. Start empagliflozin  (Jardiance ) for its renal benefits, with potential side effects of yeast infections  and dehydration. Monitor for yeast infections and adjust treatment if necessary. Check kidney function in one month and ensure adequate hydration without overhydration.   Hypothyroidism, unspecified   Thyroid  levels are slightly off, possibly due to inconsistent medication adherence. He is on a high dose of 200 mcg daily. Recent improvement in adherence is noted with medication now placed at a visible location to aid compliance. Continue the current dose of thyroid  medication and recheck thyroid  function tests with other labs in one month.  Vitamin B12 deficiency anemia   His B12 level is at 200, indicating deficiency, likely due to metformin  use and gastric bypass leading to malabsorption. Previous B12 levels were low-normal, and he has experienced symptoms consistent with deficiency in the past. Order bariatric vitamins and monitor B12 levels with future labs.  Anemia, unspecified   Anemia is likely multifactorial, potentially due to vitamin deficiencies, recent surgeries, and possible malabsorption post-gastric bypass. A recent colonoscopy in 2022 was normal, reducing concern for gastrointestinal bleeding. A urologist is investigating hematuria, but it is not believed to be significant enough to cause anemia. Order bariatric vitamins, monitor hemoglobin and hematocrit with future labs, and evaluate anemia status in two months.  Vitamin D  deficiency   Vitamin D  levels are low, likely due to malabsorption post-gastric bypass and lack of supplementation. Order bariatric vitamins and monitor vitamin D  levels with future labs.  Gastric bypass   Gastric bypass is contributing to malabsorption issues, affecting vitamin B12 and D levels, as well as anemia. Order bariatric vitamins to address malabsorption-related deficiencies.  Follow-up   Plan to monitor treatment efficacy and adjust as necessary. Schedule lab tests in one month, no fasting required, and review lab results to determine further  management.    Return for lab only in 1 month.  Ellsworth Haas, MD

## 2023-10-10 ENCOUNTER — Other Ambulatory Visit (HOSPITAL_COMMUNITY): Payer: PPO

## 2023-10-15 DIAGNOSIS — E118 Type 2 diabetes mellitus with unspecified complications: Secondary | ICD-10-CM | POA: Diagnosis not present

## 2023-10-15 DIAGNOSIS — E1159 Type 2 diabetes mellitus with other circulatory complications: Secondary | ICD-10-CM | POA: Diagnosis not present

## 2023-10-15 DIAGNOSIS — I480 Paroxysmal atrial fibrillation: Secondary | ICD-10-CM | POA: Diagnosis not present

## 2023-10-15 DIAGNOSIS — I350 Nonrheumatic aortic (valve) stenosis: Secondary | ICD-10-CM | POA: Diagnosis not present

## 2023-10-15 DIAGNOSIS — I152 Hypertension secondary to endocrine disorders: Secondary | ICD-10-CM

## 2023-10-15 NOTE — Progress Notes (Signed)
 Occasional episodes of reduced conduction, but without any associated symptoms.  These episodes are benign, and do not require any further workup.  Keep follow-up with Renelda Carry June.  Thanks MJP

## 2023-10-17 ENCOUNTER — Encounter (HOSPITAL_COMMUNITY): Payer: Self-pay

## 2023-10-18 NOTE — Progress Notes (Signed)
 He should be on Xarelto  for stroke prevention given his A-fib.  Thanks MJP

## 2023-10-18 NOTE — Progress Notes (Signed)
 Even though A-fib was not noted on most recent monitor, it has been noted in the past, therefore I would continue Xarelto .  Thanks MJP

## 2023-10-24 ENCOUNTER — Ambulatory Visit: Payer: PPO

## 2023-10-24 VITALS — Ht 71.0 in | Wt 297.0 lb

## 2023-10-24 DIAGNOSIS — Z0000 Encounter for general adult medical examination without abnormal findings: Secondary | ICD-10-CM | POA: Diagnosis not present

## 2023-10-24 NOTE — Patient Instructions (Signed)
 Mr. Dillon Jones , Thank you for taking time out of your busy schedule to complete your Annual Wellness Visit with me. I enjoyed our conversation and look forward to speaking with you again next year. I, as well as your care team,  appreciate your ongoing commitment to your health goals. Please review the following plan we discussed and let me know if I can assist you in the future. Your Game plan/ To Do List    Referrals: If you haven't heard from the office you've been referred to, please reach out to them at the phone provided.   Follow up Visits: Next Medicare AWV with our clinical staff: 10/29/24   Have you seen your provider in the last 6 months (3 months if uncontrolled diabetes)? Yes Next Office Visit with your provider: 03/23/24  Clinician Recommendations:  Aim for 30 minutes of exercise or brisk walking, 6-8 glasses of water , and 5 servings of fruits and vegetables each day.       This is a list of the screening recommended for you and due dates:  Health Maintenance  Topic Date Due   Eye exam for diabetics  03/27/2023   COVID-19 Vaccine (5 - 2024-25 season) 05/08/2023   Medicare Annual Wellness Visit  10/18/2023   DTaP/Tdap/Td vaccine (1 - Tdap) 01/21/2024*   Pneumonia Vaccine (2 of 2 - PPSV23) 01/22/2024*   Flu Shot  01/10/2024   Complete foot exam   03/19/2024   Hemoglobin A1C  03/21/2024   Yearly kidney function blood test for diabetes  09/19/2024   Yearly kidney health urinalysis for diabetes  09/19/2024   Colon Cancer Screening  10/04/2030   Hepatitis C Screening  Completed   Zoster (Shingles) Vaccine  Completed   HPV Vaccine  Aged Out   Meningitis B Vaccine  Aged Out  *Topic was postponed. The date shown is not the original due date.    Advanced directives: (Declined) Advance directive discussed with you today. Even though you declined this today, please call our office should you change your mind, and we can give you the proper paperwork for you to fill out. Advance Care  Planning is important because it:  [x]  Makes sure you receive the medical care that is consistent with your values, goals, and preferences  [x]  It provides guidance to your family and loved ones and reduces their decisional burden about whether or not they are making the right decisions based on your wishes.  Follow the link provided in your after visit summary or read over the paperwork we have mailed to you to help you started getting your Advance Directives in place. If you need assistance in completing these, please reach out to us  so that we can help you!  See attachments for Preventive Care and Fall Prevention Tips.

## 2023-10-24 NOTE — Progress Notes (Signed)
 Subjective:   Dillon Jones is a 73 y.o. who presents for a Medicare Wellness preventive visit.  As a reminder, Annual Wellness Visits don't include a physical exam, and some assessments may be limited, especially if this visit is performed virtually. We may recommend an in-person visit if needed.  Visit Complete: Virtual I connected with  Dillon Jones on 10/24/23 by a audio enabled telemedicine application and verified that I am speaking with the correct person using two identifiers.  Patient Location: Home  Provider Location: Office/Clinic  I discussed the limitations of evaluation and management by telemedicine. The patient expressed understanding and agreed to proceed.  Vital Signs: Because this visit was a virtual/telehealth visit, some criteria may be missing or patient reported. Any vitals not documented were not able to be obtained and vitals that have been documented are patient reported.  VideoDeclined- This patient declined Librarian, academic. Therefore the visit was completed with audio only.  Persons Participating in Visit: Patient.  AWV Questionnaire: No: Patient Medicare AWV questionnaire was not completed prior to this visit.  Cardiac Risk Factors include: advanced age (>4men, >39 women);dyslipidemia;hypertension;male gender;obesity (BMI >30kg/m2)     Objective:     Today's Vitals   10/24/23 1007  Weight: 297 lb (134.7 kg)  Height: 5\' 11"  (1.803 m)   Body mass index is 41.42 kg/m.     10/24/2023   10:10 AM 08/16/2023    6:07 AM 07/26/2023    7:08 PM 04/23/2023    8:33 AM 04/10/2023    1:04 PM 12/18/2022    2:32 PM 12/05/2022    2:05 PM  Advanced Directives  Does Patient Have a Medical Advance Directive? No No No No No No No  Would patient like information on creating a medical advance directive? No - Patient declined No - Patient declined No - Guardian declined No - Patient declined No - Patient declined No - Patient declined  No - Patient declined    Current Medications (verified) Outpatient Encounter Medications as of 10/24/2023  Medication Sig   CALCIUM PO Take 2 tablets by mouth daily.   empagliflozin  (JARDIANCE ) 10 MG TABS tablet Take 1 tablet (10 mg total) by mouth daily before breakfast.   hydrochlorothiazide  (MICROZIDE ) 12.5 MG capsule TAKE 1 CAPSULE BY MOUTH EVERY DAY   ketoconazole  (NIZORAL ) 2 % cream Apply 1 Application topically 2 (two) times daily. (Patient taking differently: Apply 1 Application topically daily as needed for irritation.)   levothyroxine  (SYNTHROID ) 200 MCG tablet TAKE 1 TABLET BY MOUTH EVERY DAY EVERYDAY EXCEPT MONDAY AND FRIDAY. TAKE 2 TIMES DAILY ON MONDAY AND FRIDAY   lisinopril  (ZESTRIL ) 10 MG tablet TAKE 1 TABLET BY MOUTH EVERY DAY   metFORMIN  (GLUCOPHAGE ) 1000 MG tablet Take 1 tablet (1,000 mg total) by mouth daily with breakfast.   Multiple Vitamins-Minerals (BARIATRIC MULTIVITAMINS) CAPS Take 2 capsules by mouth daily.   pravastatin  (PRAVACHOL ) 10 MG tablet TAKE 1 TABLET BY MOUTH EVERY DAY   rivaroxaban  (XARELTO ) 20 MG TABS tablet Take 1 tablet (20 mg total) by mouth daily with supper.   No facility-administered encounter medications on file as of 10/24/2023.    Allergies (verified) Patient has no known allergies.   History: Past Medical History:  Diagnosis Date   Anemia    Arthritis    Diabetes mellitus    Has had for 13 years, type 2   Diabetes mellitus-type II 02/06/2012   GERD (gastroesophageal reflux disease)    Occasional - only 1x q3m  H/O umbilical hernia repair-mesh used; done in [place] 02/06/2012   Heart murmur    Hypercholesteremia 02/06/2012   Hyperlipidemia    Hypertension    Hypothyroidism    Lap Roux Y Gastric Bypass Dec 2013 06/09/2012   Morbid obesity (HCC)    Morbid obesity with BMI of 55.8 06/05/2012   Neuromuscular disorder (HCC)    Neuropathy   OSA on CPAP 02/06/2012   Prostate cancer (HCC)    radioactive seeds   Sleep apnea    Past  Surgical History:  Procedure Laterality Date   BREATH TEK H PYLORI  04/22/2012   Procedure: BREATH TEK H PYLORI;  Surgeon: Azucena Bollard, MD;  Location: [facility];  Service: General;  Laterality: N/A;   CARDIAC CATHETERIZATION     GASTRIC ROUX-EN-Y  06/09/2012   Procedure: LAPAROSCOPIC ROUX-EN-Y GASTRIC;  Surgeon: Azucena Bollard, MD;  Location: [facility];  Service: General;  Laterality: N/A;   HERNIA REPAIR  1992   umbilical   PROSTATE BIOPSY     RADIOACTIVE SEED IMPLANT N/A 12/09/2018   Procedure: RADIOACTIVE SEED IMPLANT/BRACHYTHERAPY IMPLANT;  Surgeon: Homero Luster, MD;  Location: [facility];  Service: Urology;  Laterality: N/A;   right knee arthroscopy  1999   RIGHT/LEFT HEART CATH AND CORONARY ANGIOGRAPHY N/A 08/16/2023   Procedure: RIGHT/LEFT HEART CATH AND CORONARY ANGIOGRAPHY;  Surgeon: Cody Das, MD;  Location: [facility];  Service: Cardiovascular;  Laterality: N/A;   SPACE OAR INSTILLATION N/A 12/09/2018   Procedure: SPACE OAR INSTILLATION;  Surgeon: Homero Luster, MD;  Location: [facility];  Service: Urology;  Laterality: N/A;   TOTAL KNEE ARTHROPLASTY Right 12/18/2022   Procedure: TOTAL KNEE ARTHROPLASTY;  Surgeon: Osa Blase, MD;  Location: [facility];  Service: Orthopedics;  Laterality: Right;   TOTAL KNEE ARTHROPLASTY Left 04/23/2023   Procedure: LEFT TOTAL KNEE ARTHROPLASTY;  Surgeon: Osa Blase, MD;  Location: [facility];  Service: Orthopedics;  Laterality: Left;   VENTRAL HERNIA REPAIR  06/09/2012   Procedure: LAPAROSCOPIC VENTRAL HERNIA;  Surgeon: Azucena Bollard, MD;  Location: [facility];  Service: General;;  primary repair of ventral hernia   Family History  Problem Relation Age of Onset   Breast cancer Mother    Vision loss Mother    Varicose Veins Mother    Heart disease Father        rhuematic heart disease   Diabetes Father    Hypertension Father    Obesity Father    Varicose Veins Father    Diabetes Brother        2 brothers   Hypertension  Brother    Obesity Brother    Diabetes Maternal Grandmother    Obesity Brother    Stroke Brother    Prostate cancer Neg Hx    Colon cancer Neg Hx    Pancreatic cancer Neg Hx    Social History   Socioeconomic History   Marital status: Married    Spouse name: Not on file   Number of children: 3   Years of education: Not on file   Highest education level: Doctorate  Occupational History    Comment: working full time  Tobacco Use   Smoking status: Never    Passive exposure: Never   Smokeless tobacco: Never  Vaping Use   Vaping status: Never Used  Substance and Sexual Activity   Alcohol use: Yes    Alcohol/week: 10.0 standard drinks of alcohol    Types: 2 Glasses of wine, 8 Shots of liquor  per week    Comment: 2 shots sev times/wk   Drug use: No   Sexual activity: Not Currently    Birth control/protection: Abstinence  Other Topics Concern   Not on file  Social History Narrative   Had 3 children but 1 passed away.  No grands   Retired Nurse, adult   Social Drivers of Corporate investment banker Strain: Low Risk  (10/24/2023)   Overall Financial Resource Strain (CARDIA)    Difficulty of Paying Living Expenses: Not hard at all  Food Insecurity: No Food Insecurity (10/24/2023)   Hunger Vital Sign    Worried About Running Out of Food in the Last Year: Never true    Ran Out of Food in the Last Year: Never true  Transportation Needs: No Transportation Needs (10/24/2023)   PRAPARE - Administrator, Civil Service (Medical): No    Lack of Transportation (Non-Medical): No  Physical Activity: Insufficiently Active (10/24/2023)   Exercise Vital Sign    Days of Exercise per Week: 3 days    Minutes of Exercise per Session: 30 min  Stress: No Stress Concern Present (10/24/2023)   [employer] of Occupational Health - Occupational Stress Questionnaire    Feeling of Stress : Not at all  Social Connections: Moderately Integrated (10/24/2023)   Social Connection  and Isolation Panel [NHANES]    Frequency of Communication with Friends and Family: Once a week    Frequency of Social Gatherings with Friends and Family: More than three times a week    Attends Religious Services: More than 4 times per year    Active Member of [employer] or Organizations: No    Attends Engineer, structural: Never    Marital Status: Married    Tobacco Counseling Counseling given: Not Answered    Clinical Intake:  Pre-visit preparation completed: Yes  Pain : No/denies pain     BMI - recorded: 41.42 Nutritional Status: BMI > 30  Obese Nutritional Risks: None Diabetes: Yes CBG done?: No Did pt. bring in CBG monitor from home?: No  Lab Results  Component Value Date   HGBA1C 6.2 09/20/2023   HGBA1C 5.5 03/20/2023   HGBA1C 5.5 12/05/2022     How often do you need to have someone help you when you read instructions, pamphlets, or other written materials from your doctor or pharmacy?: 1 - Never  Interpreter Needed?: No  Information entered by :: Lamont Pilsner, LPN   Activities of Daily Living     10/24/2023   10:09 AM 04/24/2023    8:00 AM  In your present state of health, do you have any difficulty performing the following activities:  Hearing? 0   Vision? 0   Difficulty concentrating or making decisions? 0   Walking or climbing stairs? 0   Dressing or bathing? 0   Doing errands, shopping? 0 0  Preparing Food and eating ? N   Using the Toilet? N   In the past six months, have you accidently leaked urine? Y   Comment at times   Do you have problems with loss of bowel control? N   Managing your Medications? N   Managing your Finances? N   Housekeeping or managing your Housekeeping? N     Patient Care Team: Christel Cousins, MD as PCP - General (Family Medicine) Himmelrich, Jesslyn Moro, RD (Inactive) as Dietitian (Bariatrics) Judyth Nunnery, RN Nurse Navigator as Registered Nurse (Medical Oncology) Cody Das, MD as Consulting Physician  (Cardiology)  Indicate any recent Medical Services you may have received from other than [facility] providers in the past year (date may be approximate).     Assessment:    This is a routine wellness examination for Cebert.  Hearing/Vision screen Hearing Screening - Comments:: Pt denies any hearing loss  Vision Screening - Comments:: Wears rx glasses - up to date with routine eye exams with Dr Radonna Buggy with [facility]     Goals Addressed             This Visit's Progress    Patient Stated       Lose weight       Depression Screen     10/24/2023   10:11 AM 03/20/2023   11:33 AM 10/18/2022    9:37 AM 09/18/2022    1:20 PM  PHQ 2/9 Scores  PHQ - 2 Score 0 0 0 0  PHQ- 9 Score  0 0 0    Fall Risk     10/24/2023   10:12 AM 03/20/2023   11:33 AM 10/18/2022    8:30 AM 09/18/2022    1:18 PM  Fall Risk   Falls in the past year? 0 0 0 0  Number falls in past yr: 0 0 0 0  Injury with Fall? 0 0 0 0  Risk for fall due to : Impaired balance/gait;Impaired mobility No Fall Risks Impaired vision;Impaired mobility Other (Comment)  Risk for fall due to: Comment   pain with walking uses a rolling walker  Follow up Falls prevention discussed Falls evaluation completed Falls prevention discussed Falls evaluation completed    MEDICARE RISK AT HOME:  Medicare Risk at Home Any stairs in or around the home?: Yes If so, are there any without handrails?: Yes Home free of loose throw rugs in walkways, pet beds, electrical cords, etc?: Yes Adequate lighting in your home to reduce risk of falls?: Yes Life alert?: No Use of a cane, walker or w/c?: No Grab bars in the bathroom?: Yes Shower chair or bench in shower?: Yes Elevated toilet seat or a handicapped toilet?: Yes  TIMED UP AND GO:  Was the test performed?  No  Cognitive Function: 6CIT completed        10/24/2023   10:13 AM 10/18/2022    9:40 AM  6CIT Screen  What Year? 0 points 0 points  What month? 0 points 0 points  What time? 0  points 0 points  Count back from 20 0 points 0 points  Months in reverse 0 points 0 points  Repeat phrase 0 points 0 points  Total Score 0 points 0 points    Immunizations Immunization History  Administered Date(s) Administered   Fluad Trivalent(High Dose 65+) 03/13/2023   PFIZER Comirnaty(Gray Top)Covid-19 Tri-Sucrose Vaccine 03/13/2023   PFIZER(Purple Top)SARS-COV-2 Vaccination 08/10/2019, 09/08/2019   Pfizer(Comirnaty)Fall Seasonal Vaccine 12 years and older 04/30/2022   Pneumococcal Conjugate-13 02/04/2019   RSV,unspecified 03/20/2022   Zoster Recombinant(Shingrix) 07/15/2020, 02/06/2023   Zoster, Live 09/20/2022    Screening Tests Health Maintenance  Topic Date Due   OPHTHALMOLOGY EXAM  03/27/2023   COVID-19 Vaccine (5 - 2024-25 season) 05/08/2023   DTaP/Tdap/Td (1 - Tdap) 01/21/2024 (Originally 01/28/1970)   Pneumonia Vaccine 90+ Years old (2 of 2 - PPSV23) 01/22/2024 (Originally 04/01/2019)   INFLUENZA VACCINE  01/10/2024   FOOT EXAM  03/19/2024   HEMOGLOBIN A1C  03/21/2024   Diabetic kidney evaluation - eGFR measurement  09/19/2024   Diabetic kidney evaluation - Urine ACR  09/19/2024  Medicare Annual Wellness (AWV)  10/23/2024   Colonoscopy  10/04/2030   Hepatitis C Screening  Completed   Zoster Vaccines- Shingrix  Completed   HPV VACCINES  Aged Out   Meningococcal B Vaccine  Aged Out    Health Maintenance  Health Maintenance Due  Topic Date Due   OPHTHALMOLOGY EXAM  03/27/2023   COVID-19 Vaccine (5 - 2024-25 season) 05/08/2023   Health Maintenance Items Addressed: See Nurse Notes  Additional Screening:  Vision Screening: Recommended annual ophthalmology exams for early detection of glaucoma and other disorders of the eye.  Dental Screening: Recommended annual dental exams for proper oral hygiene  Community Resource Referral / Chronic Care Management: CRR required this visit?  No   CCM required this visit?  No   Plan:    I have personally  reviewed and noted the following in the patient's chart:   Medical and social history Use of alcohol, tobacco or illicit drugs  Current medications and supplements including opioid prescriptions. Patient is not currently taking opioid prescriptions. Functional ability and status Nutritional status Physical activity Advanced directives List of other physicians Hospitalizations, surgeries, and ER visits in previous 12 months Vitals Screenings to include cognitive, depression, and falls Referrals and appointments  In addition, I have reviewed and discussed with patient certain preventive protocols, quality metrics, and best practice recommendations. A written personalized care plan for preventive services as well as general preventive health recommendations were provided to patient.   Bruno Capri, LPN   0/[id number]   After Visit Summary: (MyChart) Due to this being a telephonic visit, the after visit summary with patients personalized plan was offered to patient via MyChart   Notes: Pt stated he has an upcoming eye exam with Dr Cari Char requested records be sent to office.

## 2023-11-01 ENCOUNTER — Other Ambulatory Visit (INDEPENDENT_AMBULATORY_CARE_PROVIDER_SITE_OTHER)

## 2023-11-01 DIAGNOSIS — E119 Type 2 diabetes mellitus without complications: Secondary | ICD-10-CM | POA: Diagnosis not present

## 2023-11-01 DIAGNOSIS — D508 Other iron deficiency anemias: Secondary | ICD-10-CM

## 2023-11-01 DIAGNOSIS — E039 Hypothyroidism, unspecified: Secondary | ICD-10-CM

## 2023-11-01 DIAGNOSIS — H43813 Vitreous degeneration, bilateral: Secondary | ICD-10-CM | POA: Diagnosis not present

## 2023-11-01 DIAGNOSIS — E113293 Type 2 diabetes mellitus with mild nonproliferative diabetic retinopathy without macular edema, bilateral: Secondary | ICD-10-CM | POA: Diagnosis not present

## 2023-11-01 DIAGNOSIS — H2513 Age-related nuclear cataract, bilateral: Secondary | ICD-10-CM | POA: Diagnosis not present

## 2023-11-01 DIAGNOSIS — Z7984 Long term (current) use of oral hypoglycemic drugs: Secondary | ICD-10-CM | POA: Diagnosis not present

## 2023-11-01 LAB — CBC WITH DIFFERENTIAL/PLATELET
Basophils Absolute: 0 10*3/uL (ref 0.0–0.1)
Basophils Relative: 1.1 % (ref 0.0–3.0)
Eosinophils Absolute: 0.2 10*3/uL (ref 0.0–0.7)
Eosinophils Relative: 4 % (ref 0.0–5.0)
HCT: 37.6 % — ABNORMAL LOW (ref 39.0–52.0)
Hemoglobin: 11.7 g/dL — ABNORMAL LOW (ref 13.0–17.0)
Lymphocytes Relative: 18.9 % (ref 12.0–46.0)
Lymphs Abs: 0.8 10*3/uL (ref 0.7–4.0)
MCHC: 31.2 g/dL (ref 30.0–36.0)
MCV: 73.3 fl — ABNORMAL LOW (ref 78.0–100.0)
Monocytes Absolute: 0.5 10*3/uL (ref 0.1–1.0)
Monocytes Relative: 11.4 % (ref 3.0–12.0)
Neutro Abs: 2.6 10*3/uL (ref 1.4–7.7)
Neutrophils Relative %: 64.6 % (ref 43.0–77.0)
Platelets: 183 10*3/uL (ref 150.0–400.0)
RBC: 5.13 Mil/uL (ref 4.22–5.81)
RDW: 22 % — ABNORMAL HIGH (ref 11.5–15.5)
WBC: 4.1 10*3/uL (ref 4.0–10.5)

## 2023-11-01 LAB — BASIC METABOLIC PANEL WITH GFR
BUN: 28 mg/dL — ABNORMAL HIGH (ref 6–23)
CO2: 21 meq/L (ref 19–32)
Calcium: 9.7 mg/dL (ref 8.4–10.5)
Chloride: 102 meq/L (ref 96–112)
Creatinine, Ser: 1.14 mg/dL (ref 0.40–1.50)
GFR: 65.05 mL/min (ref >60.00)
Glucose, Bld: 138 mg/dL — ABNORMAL HIGH (ref 70–99)
Potassium: 4.6 meq/L (ref 3.5–5.1)
Sodium: 135 meq/L (ref 135–145)

## 2023-11-01 LAB — VITAMIN B12: Vitamin B-12: 304 pg/mL (ref 211–911)

## 2023-11-01 LAB — TSH: TSH: 0.96 u[IU]/mL (ref 0.35–5.50)

## 2023-11-01 LAB — HM DIABETES EYE EXAM

## 2023-11-03 ENCOUNTER — Ambulatory Visit: Payer: Self-pay | Admitting: Family Medicine

## 2023-11-03 NOTE — Progress Notes (Signed)
 Stable/improved.  B12 slightly better.  Continue supplements.  Thyroid  improved as well since taking regularly.  Repeat cbcd,iron studies,B12,cmp in 3 months

## 2023-11-05 ENCOUNTER — Other Ambulatory Visit: Payer: Self-pay | Admitting: *Deleted

## 2023-11-05 DIAGNOSIS — D508 Other iron deficiency anemias: Secondary | ICD-10-CM

## 2023-11-05 DIAGNOSIS — E538 Deficiency of other specified B group vitamins: Secondary | ICD-10-CM

## 2023-11-05 DIAGNOSIS — E7800 Pure hypercholesterolemia, unspecified: Secondary | ICD-10-CM

## 2023-11-18 ENCOUNTER — Other Ambulatory Visit: Payer: Self-pay | Admitting: Family Medicine

## 2023-11-18 DIAGNOSIS — E119 Type 2 diabetes mellitus without complications: Secondary | ICD-10-CM

## 2023-11-26 ENCOUNTER — Telehealth: Payer: Self-pay

## 2023-11-26 DIAGNOSIS — H25813 Combined forms of age-related cataract, bilateral: Secondary | ICD-10-CM | POA: Diagnosis not present

## 2023-11-26 NOTE — Telephone Encounter (Signed)
   Pre-operative Risk Assessment    Patient Name: Dillon Jones  DOB: [birth year] MRN: [id number]   Date of last office visit: 09/03/23 Rejeana Card, NP Date of next office visit: 12/06/23 Rejeana Card, NP   Request for Surgical Clearance    Procedure:  CATARACT EXTRACTION BY PE, IOL - LEFT THEN RIGHT   Date of Surgery:  Clearance 12/12/23    & 12/30/23                       Surgeon:  DR Lorraine Roses Surgeon's Group or Practice Name:  [facility] Phone number:  [phone] Fax number:  [phone]   Type of Clearance Requested:   - Medical  - Pharmacy:  Hold Rivaroxaban  (Xarelto )     Type of Anesthesia:  IV SEDATION   Additional requests/questions:    Signed, Collin Deal   11/26/2023, 4:46 PM

## 2023-11-27 NOTE — Telephone Encounter (Signed)
   Patient Name: Dillon Jones  DOB: 1950-09-17 MRN: [id number]  Primary Cardiologist: Cody Das, MD  Chart reviewed as part of pre-operative protocol coverage. Cataract extractions are recognized in guidelines as low risk surgeries that do not typically require specific preoperative testing or holding of blood thinner therapy. Therefore, given past medical history and time since last visit, based on ACC/AHA guidelines, Dillon Jones would be at acceptable risk for the planned procedure without further cardiovascular testing.   I will route this recommendation to the requesting party via Epic fax function and remove from pre-op pool.  Given aortic stenosis, would avoid hypotension and aggressive IVF.  Please call with questions.  Warren Haber Kitzia Camus, PA 11/27/2023, 10:18 AM

## 2023-12-02 DIAGNOSIS — G4733 Obstructive sleep apnea (adult) (pediatric): Secondary | ICD-10-CM | POA: Diagnosis not present

## 2023-12-05 NOTE — Progress Notes (Signed)
 Cardiology Office Note    Patient Name: Dillon Jones Date of Encounter: 12/05/2023  Primary Care Provider:  Wendolyn Jenkins Jansky, MD Primary Cardiologist:  Newman JINNY Lawrence, MD Primary Electrophysiologist: None   Past Medical History    Past Medical History:  Diagnosis Date   Anemia    Arthritis    Diabetes mellitus    Has had for 13 years, type 2   Diabetes mellitus-type II 02/06/2012   GERD (gastroesophageal reflux disease)    Occasional - only 1x q67m   H/O umbilical hernia repair-mesh used; done in [place] 02/06/2012   Heart murmur    Hypercholesteremia 02/06/2012   Hyperlipidemia    Hypertension    Hypothyroidism    Lap Roux Y Gastric Bypass Dec 2013 06/09/2012   Morbid obesity (HCC)    Morbid obesity with BMI of 55.8 06/05/2012   Neuromuscular disorder (HCC)    Neuropathy   OSA on CPAP 02/06/2012   Prostate cancer (HCC)    radioactive seeds   Sleep apnea     History of Present Illness  Dillon Jones is a 73 y.o. male with a PMH of paroxysmal AF (on Xarelto ), HTN, HLD, GERD, DM type II, OSA (on CPAP), moderate AS, morbid obesity s/p gastric bypass 2013 who presents today for 53-month follow-up.  Dillon Jones was last seen on 09/03/2023 for follow-up visit.  During his visit he reported not experiencing further palpitations she was stable.  He wore a 30-day event monitor to assess AF burden that showed no evidence of atrial fibrillation with 3% PVC burden and no other arrhythmias noted.  Dillon Jones presents today for 69-month follow-up.He was last seen on March 25th for follow-up regarding palpitations. Monitoring for atrial fibrillation showed a 3% burden of premature ventricular contractions (PVCs) but no atrial fibrillation. He is on Xarelto  for prophylaxis due to his history of atrial fibrillation.  He has not experienced any recent palpitations. He is not on any medication specifically to control heart rate but is taking lisinopril . His blood pressure was slightly  elevated at 148/86, which he attributes to walking, but it typically runs in the high 130s. He has a history of type 2 diabetes and is currently on Jardiance , which he started recently. His A1c had increased from 5.5 due to not being diligent with his medication, but he plans to recheck it in a couple of months. He has a history of gastric bypass surgery and was previously 430 pounds. He is currently working on [employer] management by eating smaller portions. He has been using a CPAP machine for 30 years and reports resting well. He also mentions mild swelling in his ankles that improves with elevation. His cholesterol levels are slightly above goal, but he attributes this to not taking his medication diligently. He is considering increasing his physical activity, particularly swimming, now that the pool is open. He is also exploring other exercise options, such as the [facility] gym, which offers a pool and indoor track. Patient denies chest pain, palpitations, dyspnea, PND, orthopnea, nausea, vomiting, dizziness, syncope, edema, weight gain, or early satiety.   Discussed the use of AI scribe software for clinical note transcription with the patient, who gave verbal consent to proceed.  History of Present Illness   Review of Systems  Please see the history of present illness.    All other systems reviewed and are otherwise negative except as noted above.  Physical Exam    Wt Readings from Last 3 Encounters:  10/24/23 297 lb (134.7  kg)  10/03/23 297 lb 8 oz (134.9 kg)  09/20/23 293 lb 6 oz (133.1 kg)   CD:Uyzmz were no vitals filed for this visit.,There is no height or weight on file to calculate BMI. GEN: Well nourished, well developed in no acute distress Neck: No JVD; No carotid bruits Pulmonary: Clear to auscultation without rales, wheezing or rhonchi  Cardiovascular: Normal rate. Regular rhythm. Normal S1. Normal S2.   Murmurs: There is no murmur.  ABDOMEN: Soft, non-tender,  non-distended EXTREMITIES:  No edema; No deformity   EKG/LABS/ Recent Cardiac Studies   ECG personally reviewed by me today -none completed today  Risk Assessment/Calculations:    CHA2DS2-VASc Score = 3   This indicates a 3.2% annual risk of stroke. The patient's score is based upon: CHF History: 0 HTN History: 1 Diabetes History: 1 Stroke History: 0 Vascular Disease History: 0 Age Score: 1 Gender Score: 0         Lab Results  Component Value Date   WBC 4.1 11/01/2023   HGB 11.7 (L) 11/01/2023   HCT 37.6 (L) 11/01/2023   MCV 73.3 (L) 11/01/2023   PLT 183.0 11/01/2023   Lab Results  Component Value Date   CREATININE 1.14 11/01/2023   BUN 28 (H) 11/01/2023   NA 135 11/01/2023   K 4.6 11/01/2023   CL 102 11/01/2023   CO2 21 11/01/2023   Lab Results  Component Value Date   CHOL 192 09/20/2023   HDL 101.00 09/20/2023   LDLCALC 75 09/20/2023   TRIG 77.0 09/20/2023   CHOLHDL 2 09/20/2023    Lab Results  Component Value Date   HGBA1C 6.2 09/20/2023   Assessment & Plan    Assessment and Plan Assessment & Plan Premature ventricular contractions 3% PVC burden, asymptomatic, no atrial fibrillation. - Continue Xarelto  for prophylaxis. - Consider beta blocker or calcium channel blocker if palpitations increase.  Hypertension Initial elevated blood pressure likely due to activity, normalized on recheck. - Recheck blood pressure before concluding the visit.  Type 2 diabetes mellitus Managed with Jardiance , recent A1c increase due to non-adherence, now improved. - Continue Jardiance . - Recheck A1c in a couple of months with primary care provider.  Obstructive sleep apnea Managed with CPAP therapy, compliant and effective.  Gastric bypass surgery Significant weight loss post-surgery, recent weight gain, not interested in medications. - Encourage smaller portion sizes and increased physical activity. - Consider weight loss medications if weight plateau  persists.    1.Paroxysmal AF:  - Patient is rate controlled at 88 bpm today with event monitor showing no evidence of atrial fibrillation. - Patient did have 3% PVC burden, asymptomatic, no atrial fibrillation. - Continue Xarelto  for prophylaxis. - Consider beta blocker or calcium channel blocker if palpitations increase.  2. Severe nonrheumatic AS: -2D echo completed 07/2023 showing EF of 50-55% with severe dilated LA, trivial MR and moderate to severe aortic stenosis -Patient reports no shortness of breath or dizziness since previous follow-up. - Continue lisinopril  10 mg for BP control. - Continue to monitor lower extremities for swelling and shortness of breath  3.  Essential HTN: - Patient's blood pressure today was stable at 148/86 and decreased to 118/72 on recheck - Continue lisinopril  10 mg daily and HCTZ 12.5 mg daily  4.  Hyperlipidemia: - Patient's LDL cholesterol was 75 at goal - Continue pravastatin  10 mg daily  5.  Diabetes type 2 Managed with Jardiance , recent A1c increase due to non-adherence, now improved. - Continue Jardiance . - Recheck A1c in  a couple of months with primary care provider.  6.  Obesity: - Patient's BMI is 42.48 kg/m -History of gastric bypass surgery -Significant weight loss post-surgery, recent weight gain, not interested in medications. - Encourage smaller portion sizes and increased physical activity. - Consider weight loss medications if weight plateau persists.  Disposition: Follow-up with Newman JINNY Lawrence, MD or APP in 6 months    Signed, Wyn Raddle, Jackee Shove, NP 12/05/2023, 1:07 PM [facility]

## 2023-12-06 ENCOUNTER — Encounter: Payer: Self-pay | Admitting: Nurse Practitioner

## 2023-12-06 ENCOUNTER — Ambulatory Visit: Attending: Nurse Practitioner | Admitting: Nurse Practitioner

## 2023-12-06 VITALS — BP 148/86 | HR 88 | Ht 71.0 in | Wt 304.0 lb

## 2023-12-06 DIAGNOSIS — E118 Type 2 diabetes mellitus with unspecified complications: Secondary | ICD-10-CM | POA: Diagnosis not present

## 2023-12-06 DIAGNOSIS — E1159 Type 2 diabetes mellitus with other circulatory complications: Secondary | ICD-10-CM | POA: Diagnosis not present

## 2023-12-06 DIAGNOSIS — I350 Nonrheumatic aortic (valve) stenosis: Secondary | ICD-10-CM | POA: Diagnosis not present

## 2023-12-06 DIAGNOSIS — E1169 Type 2 diabetes mellitus with other specified complication: Secondary | ICD-10-CM | POA: Diagnosis not present

## 2023-12-06 DIAGNOSIS — E785 Hyperlipidemia, unspecified: Secondary | ICD-10-CM | POA: Diagnosis not present

## 2023-12-06 DIAGNOSIS — I152 Hypertension secondary to endocrine disorders: Secondary | ICD-10-CM

## 2023-12-06 DIAGNOSIS — I480 Paroxysmal atrial fibrillation: Secondary | ICD-10-CM | POA: Diagnosis not present

## 2023-12-06 NOTE — Patient Instructions (Addendum)
 Medication Instructions:  Your physician recommends that you continue on your current medications as directed. Please refer to the Current Medication list given to you today. *If you need a refill on your cardiac medications before your next appointment, please call your pharmacy*  Lab Work: None ordered If you have labs (blood work) drawn today and your tests are completely normal, you will receive your results only by: MyChart Message (if you have MyChart) OR A paper copy in the mail If you have any lab test that is abnormal or we need to change your treatment, we will call you to review the results.  Testing/Procedures: THERE IS AN ORDER FOR THE ECHO PLEASE SCHEDULE FOR FEBRUARY   Follow-Up: At [facility], you and your health needs are our priority.  As part of our continuing mission to provide you with exceptional heart care, our providers are all part of one team.  This team includes your primary Cardiologist (physician) and Advanced Practice Providers or APPs (Physician Assistants and Nurse Practitioners) who all work together to provide you with the care you need, when you need it.  Your next appointment:   12 month(s)  Provider:   Newman JINNY Lawrence, MD  or Jackee Alberts, NP  We recommend signing up for the patient portal called MyChart.  Sign up information is provided on this After Visit Summary.  MyChart is used to connect with patients for Virtual Visits (Telemedicine).  Patients are able to view lab/test results, encounter notes, upcoming appointments, etc.  Non-urgent messages can be sent to your provider as well.   To learn more about what you can do with MyChart, go to [email or web address].   Other Instructions

## 2023-12-11 DIAGNOSIS — Z8546 Personal history of malignant neoplasm of prostate: Secondary | ICD-10-CM | POA: Diagnosis not present

## 2023-12-12 DIAGNOSIS — E039 Hypothyroidism, unspecified: Secondary | ICD-10-CM | POA: Diagnosis not present

## 2023-12-12 DIAGNOSIS — H25812 Combined forms of age-related cataract, left eye: Secondary | ICD-10-CM | POA: Diagnosis not present

## 2023-12-12 DIAGNOSIS — E1136 Type 2 diabetes mellitus with diabetic cataract: Secondary | ICD-10-CM | POA: Diagnosis not present

## 2023-12-27 DIAGNOSIS — M5134 Other intervertebral disc degeneration, thoracic region: Secondary | ICD-10-CM | POA: Diagnosis not present

## 2023-12-27 DIAGNOSIS — M9902 Segmental and somatic dysfunction of thoracic region: Secondary | ICD-10-CM | POA: Diagnosis not present

## 2023-12-27 DIAGNOSIS — M9901 Segmental and somatic dysfunction of cervical region: Secondary | ICD-10-CM | POA: Diagnosis not present

## 2023-12-27 DIAGNOSIS — M5032 Other cervical disc degeneration, mid-cervical region, unspecified level: Secondary | ICD-10-CM | POA: Diagnosis not present

## 2023-12-30 ENCOUNTER — Other Ambulatory Visit: Payer: Self-pay

## 2023-12-30 DIAGNOSIS — I350 Nonrheumatic aortic (valve) stenosis: Secondary | ICD-10-CM

## 2024-01-02 DIAGNOSIS — E1136 Type 2 diabetes mellitus with diabetic cataract: Secondary | ICD-10-CM | POA: Diagnosis not present

## 2024-01-02 DIAGNOSIS — H25811 Combined forms of age-related cataract, right eye: Secondary | ICD-10-CM | POA: Diagnosis not present

## 2024-01-02 DIAGNOSIS — G4733 Obstructive sleep apnea (adult) (pediatric): Secondary | ICD-10-CM | POA: Diagnosis not present

## 2024-01-03 DIAGNOSIS — M5032 Other cervical disc degeneration, mid-cervical region, unspecified level: Secondary | ICD-10-CM | POA: Diagnosis not present

## 2024-01-03 DIAGNOSIS — M9901 Segmental and somatic dysfunction of cervical region: Secondary | ICD-10-CM | POA: Diagnosis not present

## 2024-01-03 DIAGNOSIS — M9902 Segmental and somatic dysfunction of thoracic region: Secondary | ICD-10-CM | POA: Diagnosis not present

## 2024-01-03 DIAGNOSIS — M5134 Other intervertebral disc degeneration, thoracic region: Secondary | ICD-10-CM | POA: Diagnosis not present

## 2024-01-07 DIAGNOSIS — M9902 Segmental and somatic dysfunction of thoracic region: Secondary | ICD-10-CM | POA: Diagnosis not present

## 2024-01-07 DIAGNOSIS — M9901 Segmental and somatic dysfunction of cervical region: Secondary | ICD-10-CM | POA: Diagnosis not present

## 2024-01-07 DIAGNOSIS — M5134 Other intervertebral disc degeneration, thoracic region: Secondary | ICD-10-CM | POA: Diagnosis not present

## 2024-01-07 DIAGNOSIS — M5032 Other cervical disc degeneration, mid-cervical region, unspecified level: Secondary | ICD-10-CM | POA: Diagnosis not present

## 2024-01-10 DIAGNOSIS — M9901 Segmental and somatic dysfunction of cervical region: Secondary | ICD-10-CM | POA: Diagnosis not present

## 2024-01-10 DIAGNOSIS — M5134 Other intervertebral disc degeneration, thoracic region: Secondary | ICD-10-CM | POA: Diagnosis not present

## 2024-01-10 DIAGNOSIS — M9902 Segmental and somatic dysfunction of thoracic region: Secondary | ICD-10-CM | POA: Diagnosis not present

## 2024-01-10 DIAGNOSIS — M5032 Other cervical disc degeneration, mid-cervical region, unspecified level: Secondary | ICD-10-CM | POA: Diagnosis not present

## 2024-01-14 DIAGNOSIS — M9901 Segmental and somatic dysfunction of cervical region: Secondary | ICD-10-CM | POA: Diagnosis not present

## 2024-01-14 DIAGNOSIS — M9902 Segmental and somatic dysfunction of thoracic region: Secondary | ICD-10-CM | POA: Diagnosis not present

## 2024-01-14 DIAGNOSIS — M9903 Segmental and somatic dysfunction of lumbar region: Secondary | ICD-10-CM | POA: Diagnosis not present

## 2024-01-14 DIAGNOSIS — M5386 Other specified dorsopathies, lumbar region: Secondary | ICD-10-CM | POA: Diagnosis not present

## 2024-01-14 DIAGNOSIS — M5032 Other cervical disc degeneration, mid-cervical region, unspecified level: Secondary | ICD-10-CM | POA: Diagnosis not present

## 2024-01-14 DIAGNOSIS — M5134 Other intervertebral disc degeneration, thoracic region: Secondary | ICD-10-CM | POA: Diagnosis not present

## 2024-01-15 DIAGNOSIS — N200 Calculus of kidney: Secondary | ICD-10-CM | POA: Diagnosis not present

## 2024-01-15 DIAGNOSIS — Z8546 Personal history of malignant neoplasm of prostate: Secondary | ICD-10-CM | POA: Diagnosis not present

## 2024-01-15 DIAGNOSIS — N3941 Urge incontinence: Secondary | ICD-10-CM | POA: Diagnosis not present

## 2024-01-17 DIAGNOSIS — M9903 Segmental and somatic dysfunction of lumbar region: Secondary | ICD-10-CM | POA: Diagnosis not present

## 2024-01-17 DIAGNOSIS — M5386 Other specified dorsopathies, lumbar region: Secondary | ICD-10-CM | POA: Diagnosis not present

## 2024-01-17 DIAGNOSIS — M5032 Other cervical disc degeneration, mid-cervical region, unspecified level: Secondary | ICD-10-CM | POA: Diagnosis not present

## 2024-01-17 DIAGNOSIS — M9901 Segmental and somatic dysfunction of cervical region: Secondary | ICD-10-CM | POA: Diagnosis not present

## 2024-01-17 DIAGNOSIS — M5134 Other intervertebral disc degeneration, thoracic region: Secondary | ICD-10-CM | POA: Diagnosis not present

## 2024-01-17 DIAGNOSIS — M9902 Segmental and somatic dysfunction of thoracic region: Secondary | ICD-10-CM | POA: Diagnosis not present

## 2024-01-22 DIAGNOSIS — M9902 Segmental and somatic dysfunction of thoracic region: Secondary | ICD-10-CM | POA: Diagnosis not present

## 2024-01-22 DIAGNOSIS — M9903 Segmental and somatic dysfunction of lumbar region: Secondary | ICD-10-CM | POA: Diagnosis not present

## 2024-01-22 DIAGNOSIS — M5032 Other cervical disc degeneration, mid-cervical region, unspecified level: Secondary | ICD-10-CM | POA: Diagnosis not present

## 2024-01-22 DIAGNOSIS — M5134 Other intervertebral disc degeneration, thoracic region: Secondary | ICD-10-CM | POA: Diagnosis not present

## 2024-01-22 DIAGNOSIS — M5386 Other specified dorsopathies, lumbar region: Secondary | ICD-10-CM | POA: Diagnosis not present

## 2024-01-22 DIAGNOSIS — M9901 Segmental and somatic dysfunction of cervical region: Secondary | ICD-10-CM | POA: Diagnosis not present

## 2024-01-24 DIAGNOSIS — M9902 Segmental and somatic dysfunction of thoracic region: Secondary | ICD-10-CM | POA: Diagnosis not present

## 2024-01-24 DIAGNOSIS — M9901 Segmental and somatic dysfunction of cervical region: Secondary | ICD-10-CM | POA: Diagnosis not present

## 2024-01-24 DIAGNOSIS — M5032 Other cervical disc degeneration, mid-cervical region, unspecified level: Secondary | ICD-10-CM | POA: Diagnosis not present

## 2024-01-24 DIAGNOSIS — M5134 Other intervertebral disc degeneration, thoracic region: Secondary | ICD-10-CM | POA: Diagnosis not present

## 2024-01-24 DIAGNOSIS — M9903 Segmental and somatic dysfunction of lumbar region: Secondary | ICD-10-CM | POA: Diagnosis not present

## 2024-01-24 DIAGNOSIS — M5386 Other specified dorsopathies, lumbar region: Secondary | ICD-10-CM | POA: Diagnosis not present

## 2024-01-28 DIAGNOSIS — M9903 Segmental and somatic dysfunction of lumbar region: Secondary | ICD-10-CM | POA: Diagnosis not present

## 2024-01-28 DIAGNOSIS — M5134 Other intervertebral disc degeneration, thoracic region: Secondary | ICD-10-CM | POA: Diagnosis not present

## 2024-01-28 DIAGNOSIS — M5032 Other cervical disc degeneration, mid-cervical region, unspecified level: Secondary | ICD-10-CM | POA: Diagnosis not present

## 2024-01-28 DIAGNOSIS — M9902 Segmental and somatic dysfunction of thoracic region: Secondary | ICD-10-CM | POA: Diagnosis not present

## 2024-01-28 DIAGNOSIS — M5386 Other specified dorsopathies, lumbar region: Secondary | ICD-10-CM | POA: Diagnosis not present

## 2024-01-28 DIAGNOSIS — M9901 Segmental and somatic dysfunction of cervical region: Secondary | ICD-10-CM | POA: Diagnosis not present

## 2024-01-31 DIAGNOSIS — M5386 Other specified dorsopathies, lumbar region: Secondary | ICD-10-CM | POA: Diagnosis not present

## 2024-01-31 DIAGNOSIS — M9903 Segmental and somatic dysfunction of lumbar region: Secondary | ICD-10-CM | POA: Diagnosis not present

## 2024-01-31 DIAGNOSIS — M5134 Other intervertebral disc degeneration, thoracic region: Secondary | ICD-10-CM | POA: Diagnosis not present

## 2024-01-31 DIAGNOSIS — M9901 Segmental and somatic dysfunction of cervical region: Secondary | ICD-10-CM | POA: Diagnosis not present

## 2024-01-31 DIAGNOSIS — M9902 Segmental and somatic dysfunction of thoracic region: Secondary | ICD-10-CM | POA: Diagnosis not present

## 2024-01-31 DIAGNOSIS — M5032 Other cervical disc degeneration, mid-cervical region, unspecified level: Secondary | ICD-10-CM | POA: Diagnosis not present

## 2024-02-04 DIAGNOSIS — M9902 Segmental and somatic dysfunction of thoracic region: Secondary | ICD-10-CM | POA: Diagnosis not present

## 2024-02-04 DIAGNOSIS — M9901 Segmental and somatic dysfunction of cervical region: Secondary | ICD-10-CM | POA: Diagnosis not present

## 2024-02-04 DIAGNOSIS — M5032 Other cervical disc degeneration, mid-cervical region, unspecified level: Secondary | ICD-10-CM | POA: Diagnosis not present

## 2024-02-04 DIAGNOSIS — M9903 Segmental and somatic dysfunction of lumbar region: Secondary | ICD-10-CM | POA: Diagnosis not present

## 2024-02-04 DIAGNOSIS — M5386 Other specified dorsopathies, lumbar region: Secondary | ICD-10-CM | POA: Diagnosis not present

## 2024-02-04 DIAGNOSIS — M5134 Other intervertebral disc degeneration, thoracic region: Secondary | ICD-10-CM | POA: Diagnosis not present

## 2024-02-07 DIAGNOSIS — M9901 Segmental and somatic dysfunction of cervical region: Secondary | ICD-10-CM | POA: Diagnosis not present

## 2024-02-07 DIAGNOSIS — M9902 Segmental and somatic dysfunction of thoracic region: Secondary | ICD-10-CM | POA: Diagnosis not present

## 2024-02-07 DIAGNOSIS — M9903 Segmental and somatic dysfunction of lumbar region: Secondary | ICD-10-CM | POA: Diagnosis not present

## 2024-02-07 DIAGNOSIS — M5032 Other cervical disc degeneration, mid-cervical region, unspecified level: Secondary | ICD-10-CM | POA: Diagnosis not present

## 2024-02-07 DIAGNOSIS — M5134 Other intervertebral disc degeneration, thoracic region: Secondary | ICD-10-CM | POA: Diagnosis not present

## 2024-02-07 DIAGNOSIS — M5386 Other specified dorsopathies, lumbar region: Secondary | ICD-10-CM | POA: Diagnosis not present

## 2024-03-06 NOTE — Progress Notes (Unsigned)
 Patient ID: Dillon Jones MRN: [id number] DOB/AGE: Oct 29, 1950 73 y.o.  Primary Care Physician:Kulik, Jenkins Jansky, MD Primary Cardiologist: [place]  CC:  Aortic valvular disease management     FOCUSED PROBLEM LIST:   Aortic stenosis AVA 0.92, MG 32 DI 0.24 V-max 3.7 EF 50 to 55% TTE February 2025 AVA 1.48, MG 16, V-max 2.79, EF 55-60% September 2025 EKG atrial fibrillation without bundle-branch blocks and occasional PVCs Type II DM On metformin  Hypertension Hyperlipidemia CKD stage IIIa PAF CV 2 score 3 On Xarelto  BMI 41/BSA 2.15 August 2023:  Patient consents to use of AI scribe. The patient was seen by his primary cardiologist last month.  He had developed some shortness of breath and chest tightness.  His echocardiogram demonstrated a dimensionless index of less than 0.25.  He was referred for cardiac catheterization which showed no obstructive coronary artery disease.  He is here for recommendations regarding his severe symptomatic aortic stenosis.  On Valentine's Day, he experienced shortness of breath, chest tightness, and an inability to take a deep breath, prompting an urgent care visit. Diagnostic tests, including an EKG and troponin tests, showed elevated but not rising troponin levels. His symptoms resolved by the end of the visit.  He did have children at his house and thought he may have developed a URI.  Currently, no shortness of breath during daily activities, such as walking or performing household tasks, and no lightheadedness or difficulty breathing when lying flat. He is hesitant to return to the gym due to these recent events but plans to resume exercise.  He has a history of atrial fibrillation and is currently on Xarelto , which he resumed after a recent cardiac catheterization that did not reveal any severe blockages. He has not experienced a stroke and was not in atrial fibrillation during the Valentine's Day episode.  He has type 2 diabetes, managed with  metformin , and reports a stable hemoglobin A1c of 5.5% for years following gastric bypass surgery 14 years ago. He has not discussed discontinuing metformin  with his primary care provider.  He has high cholesterol, managed with pravastatin , and high blood pressure, managed with multiple medications. He is aware of having mildly impaired kidney function.  In terms of social history, he is retired and previously worked as a Publishing rights manager. He enjoys cooking and is active in daily life, though his activity is limited by knee issues. He has undergone knee surgeries, with the most recent one in November, and his wife is scheduled for knee surgery soon.  He has a history of cavities but denies any dental symptoms currently.  Plan: Follow-up in 6 months.  September 2025:  Patient consents to use of AI scribe. He was seen in June and was doing well and without cardiovascular complaints.  He experiences occasional exertional dyspnea, which resolves with rest. No chest pain, lightheadedness, or orthopnea.  His blood pressure is typically in the 130s, but it was 144 during the visit. He is on hydrochlorothiazide  12.5 mg daily.  He has diabetes managed with metformin  and Jardiance . His hemoglobin A1c is in the 5s, attributed to a gastric bypass surgery 12 years ago. He notes recent weight gain due to dietary changes while caring for his wife post-surgery.  He is on anticoagulation therapy and reports skin tenderness but no excessive bleeding from minor injuries.  His knees have improved following bilateral knee replacement surgeries, with the last surgery in November, and he can now walk without issues.  Past Medical History:  Diagnosis Date   Anemia    Arthritis    Diabetes mellitus    Has had for 13 years, type 2   Diabetes mellitus-type II 02/06/2012   GERD (gastroesophageal reflux disease)    Occasional - only 1x q20m   H/O umbilical hernia repair-mesh used; done in  [place] 02/06/2012   Heart murmur    Hypercholesteremia 02/06/2012   Hyperlipidemia    Hypertension    Hypothyroidism    Lap Roux Y Gastric Bypass Dec 2013 06/09/2012   Morbid obesity (HCC)    Morbid obesity with BMI of 55.8 06/05/2012   Neuromuscular disorder (HCC)    Neuropathy   OSA on CPAP 02/06/2012   Prostate cancer (HCC)    radioactive seeds   Sleep apnea     Past Surgical History:  Procedure Laterality Date   BREATH TEK H PYLORI  04/22/2012   Procedure: BREATH TEK H PYLORI;  Surgeon: Donnice KATHEE Lunger, MD;  Location: [facility];  Service: General;  Laterality: N/A;   CARDIAC CATHETERIZATION     GASTRIC ROUX-EN-Y  06/09/2012   Procedure: LAPAROSCOPIC ROUX-EN-Y GASTRIC;  Surgeon: Donnice KATHEE Lunger, MD;  Location: [facility];  Service: General;  Laterality: N/A;   HERNIA REPAIR  1992   umbilical   PROSTATE BIOPSY     RADIOACTIVE SEED IMPLANT N/A 12/09/2018   Procedure: RADIOACTIVE SEED IMPLANT/BRACHYTHERAPY IMPLANT;  Surgeon: Watt Rush, MD;  Location: [facility];  Service: Urology;  Laterality: N/A;   right knee arthroscopy  1999   RIGHT/LEFT HEART CATH AND CORONARY ANGIOGRAPHY N/A 08/16/2023   Procedure: RIGHT/LEFT HEART CATH AND CORONARY ANGIOGRAPHY;  Surgeon: Elmira Newman PARAS, MD;  Location: [facility];  Service: Cardiovascular;  Laterality: N/A;   SPACE OAR INSTILLATION N/A 12/09/2018   Procedure: SPACE OAR INSTILLATION;  Surgeon: Watt Rush, MD;  Location: [facility];  Service: Urology;  Laterality: N/A;   TOTAL KNEE ARTHROPLASTY Right 12/18/2022   Procedure: TOTAL KNEE ARTHROPLASTY;  Surgeon: Josefina Chew, MD;  Location: [facility];  Service: Orthopedics;  Laterality: Right;   TOTAL KNEE ARTHROPLASTY Left 04/23/2023   Procedure: LEFT TOTAL KNEE ARTHROPLASTY;  Surgeon: Josefina Chew, MD;  Location: [facility];  Service: Orthopedics;  Laterality: Left;   VENTRAL HERNIA REPAIR  06/09/2012   Procedure: LAPAROSCOPIC VENTRAL HERNIA;  Surgeon: Donnice KATHEE Lunger, MD;  Location:  [facility];  Service: General;;  primary repair of ventral hernia    Family History  Problem Relation Age of Onset   Breast cancer Mother    Vision loss Mother    Varicose Veins Mother    Heart disease Father        rhuematic heart disease   Diabetes Father    Hypertension Father    Obesity Father    Varicose Veins Father    Diabetes Brother        2 brothers   Hypertension Brother    Obesity Brother    Diabetes Maternal Grandmother    Obesity Brother    Stroke Brother    Prostate cancer Neg Hx    Colon cancer Neg Hx    Pancreatic cancer Neg Hx     Social History   Socioeconomic History   Marital status: Married    Spouse name: Not on file   Number of children: 3   Years of education: Not on file   Highest education level: Doctorate  Occupational History    Comment: working full time  Tobacco Use   Smoking  status: Never    Passive exposure: Never   Smokeless tobacco: Never  Vaping Use   Vaping status: Never Used  Substance and Sexual Activity   Alcohol use: Yes    Alcohol/week: 10.0 standard drinks of alcohol    Types: 2 Glasses of wine, 8 Shots of liquor per week    Comment: 2 shots sev times/wk   Drug use: No   Sexual activity: Not Currently    Birth control/protection: Abstinence  Other Topics Concern   Not on file  Social History Narrative   Had 3 children but 1 passed away.  No grands   Retired Nurse, adult   Social Drivers of Corporate investment banker Strain: Low Risk  (10/24/2023)   Overall Financial Resource Strain (CARDIA)    Difficulty of Paying Living Expenses: Not hard at all  Food Insecurity: No Food Insecurity (10/24/2023)   Hunger Vital Sign    Worried About Running Out of Food in the Last Year: Never true    Ran Out of Food in the Last Year: Never true  Transportation Needs: No Transportation Needs (10/24/2023)   PRAPARE - Administrator, Civil Service (Medical): No    Lack of Transportation (Non-Medical): No  Physical  Activity: Insufficiently Active (10/24/2023)   Exercise Vital Sign    Days of Exercise per Week: 3 days    Minutes of Exercise per Session: 30 min  Stress: No Stress Concern Present (10/24/2023)   [employer] of Occupational Health - Occupational Stress Questionnaire    Feeling of Stress : Not at all  Social Connections: Moderately Integrated (10/24/2023)   Social Connection and Isolation Panel    Frequency of Communication with Friends and Family: Once a week    Frequency of Social Gatherings with Friends and Family: More than three times a week    Attends Religious Services: More than 4 times per year    Active Member of [employer] or Organizations: No    Attends Banker Meetings: Never    Marital Status: Married  Catering manager Violence: Not At Risk (10/24/2023)   Humiliation, Afraid, Rape, and Kick questionnaire    Fear of Current or Ex-Partner: No    Emotionally Abused: No    Physically Abused: No    Sexually Abused: No     Prior to Admission medications   Medication Sig Start Date End Date Taking? Authorizing Provider  CALCIUM PO Take 2 tablets by mouth daily.    [provider]  hydrochlorothiazide  (MICROZIDE ) 12.5 MG capsule TAKE 1 CAPSULE BY MOUTH EVERY DAY 12/18/22   Wendolyn Jenkins Jansky, MD  ketoconazole  (NIZORAL ) 2 % cream Apply 1 Application topically 2 (two) times daily. Patient taking differently: Apply 1 Application topically daily as needed for irritation. 09/27/22   Wendolyn Jenkins Jansky, MD  levothyroxine  (SYNTHROID ) 200 MCG tablet TAKE 1 TABLET BY MOUTH EVERY DAY EVERYDAY EXCEPT MONDAY AND FRIDAY. TAKE 2 TIMES DAILY ON MONDAY AND FRIDAY 12/18/22   Wendolyn Jenkins Jansky, MD  lisinopril  (ZESTRIL ) 10 MG tablet TAKE 1 TABLET BY MOUTH EVERY DAY 12/18/22   Wendolyn Jenkins Jansky, MD  metFORMIN  (GLUCOPHAGE ) 1000 MG tablet Take 1 tablet (1,000 mg total) by mouth daily with breakfast. 03/07/23   Wendolyn Jenkins Jansky, MD  Multiple Vitamins-Minerals (BARIATRIC MULTIVITAMINS) CAPS Take 2  capsules by mouth daily.    [provider]  pravastatin  (PRAVACHOL ) 10 MG tablet TAKE 1 TABLET BY MOUTH EVERY DAY 12/18/22   Wendolyn Jenkins Jansky, MD  rivaroxaban  (  XARELTO ) 20 MG TABS tablet Take 1 tablet (20 mg total) by mouth daily with supper. 08/06/23   Patwardhan, Newman PARAS, MD    No Known Allergies  REVIEW OF SYSTEMS:  General: no fevers/chills/night sweats Eyes: no blurry vision, diplopia, or amaurosis ENT: no sore throat or hearing loss Resp: no cough, wheezing, or hemoptysis CV: no edema or palpitations GI: no abdominal pain, nausea, vomiting, diarrhea, or constipation GU: no dysuria, frequency, or hematuria Skin: no rash Neuro: no headache, numbness, tingling, or weakness of extremities Musculoskeletal: no joint pain or swelling Heme: no bleeding, DVT, or easy bruising Endo: no polydipsia or polyuria  BP (!) 144/70 (BP Location: Left Arm, Patient Position: Sitting, Cuff Size: Large)   Pulse (!) 50   Ht 5' 11 (1.803 m)   Wt (!) 322 lb 3.2 oz (146.1 kg)   SpO2 98%   BMI 44.94 kg/m   PHYSICAL EXAM: GEN:  AO x 3 in no acute distress HEENT: normal Dentition: Normal Neck: JVP normal. +2 carotid upstrokes without bruits. No thyromegaly. Lungs: equal expansion, clear bilaterally CV: Apex is discrete and nondisplaced, irregular RR with 3/6 SEM Abd: soft, non-tender, non-distended; no bruit; positive bowel sounds Ext: no edema, ecchymoses, or cyanosis Vascular: 2+ femoral pulses, 2+ radial pulses       Skin: warm and dry without rash Neuro: CN II-XII grossly intact; motor and sensory grossly intact    DATA AND STUDIES:  EKG: EKG from February 2025 demonstrates atrial fibrillation with occasional PVCs  EKG Interpretation Date/Time:    Ventricular Rate:    PR Interval:    QRS Duration:    QT Interval:    QTC Calculation:   R Axis:      Text Interpretation:          Cardiac Studies & Procedures    ______________________________________________________________________________________________ CARDIAC CATHETERIZATION  CARDIAC CATHETERIZATION 08/16/2023  Conclusion Coronary angiography 08/16/2023: LM: Normal LAD: Large, no significant disease Lcx: Large, no significant disease RCA: Dominant, no significant disease  LVEDP 28 mmHg  Right heart catheterization 08/16/2023: RA: 15 mmHg RV: 79/0 mmHg PA: 84/34 mmHg, mPAP 53 mmHg PCW: 31 mmHg  AO sats: 96% PA sats: 67%  CO: 7.6 L/min CI: 3.0 L/min/m2  LV-Ao pullback was performed given equivocal findings on echocardiogram Aortic valve mean PG 48 mmHg, AVA 0.94 mm2  Normal coronaries, no significant CAD Severe aortic stenosis Severe pulmonary hypertension. WHO Grp II Elevated filling pressures  Will refer to TAVR clinic  Newman PARAS Lawrence, MD  Findings Coronary Findings Diagnostic  Dominance: Right  Left Main Vessel is large. Vessel is angiographically normal.  Left Anterior Descending Vessel is large. Vessel is angiographically normal.  Left Circumflex Vessel is large. Vessel is angiographically normal.  Right Coronary Artery Vessel is normal in caliber. Vessel is angiographically normal.  Intervention  No interventions have been documented.   STRESS TESTS  MYOCARDIAL PERFUSION IMAGING 09/25/2022  Interpretation Summary   The study is normal. The study is low risk.   No ST deviation was noted.   LV perfusion is normal. There is no evidence of ischemia. There is no evidence of infarction.   Left ventricular function is normal. Nuclear stress EF: 54 %. The left ventricular ejection fraction is mildly decreased (45-54%). End diastolic cavity size is normal. End systolic cavity size is normal.   Prior study available for comparison from 08/26/2018.  Normal stress nuclear study with inferior thinning but no ischemia or infarction; gated EF 54 with normal wall motion.  ECHOCARDIOGRAM  ECHOCARDIOGRAM  COMPLETE 03/09/2024  Narrative ECHOCARDIOGRAM REPORT    Patient Name:   Dillon Jones Date of Exam: 03/09/2024 Medical Rec #:  [id number]      Height:       71.0 in Accession #:    [id number]     Weight:       304.0 lb Date of Birth:  August 03, 1950      BSA:          2.519 m Patient Age:    73 years       BP:           148/86 mmHg Patient Gender: M              HR:           92 bpm. Exam Location:  [address]  Procedure: 2D Echo and Intracardiac Opacification Agent (Both Spectral and Color Flow Doppler were utilized during procedure).  Indications:    I35.0 Nonrheumatic aortic (valve) stenosis  History:        Patient has prior history of Echocardiogram examinations, most recent 08/08/2023. Risk Factors:Hypertension, Diabetes, Dyslipidemia and Sleep Apnea. Hypothyroidism. Morbid obesity. Prostate cancer.  Sonographer:    Jon Hacker RCS Referring Phys: [id number] Terence Bart K Chalet Kerwin  IMPRESSIONS   1. Left ventricular ejection fraction, by estimation, is 55 to 60%. The left ventricle has normal function. The left ventricle has no regional wall motion abnormalities. There is mild concentric left ventricular hypertrophy. Left ventricular diastolic parameters are consistent with Grade II diastolic dysfunction (pseudonormalization). 2. Right ventricular systolic function is mildly reduced. The right ventricular size is moderately enlarged. 3. Left atrial size was mildly dilated. 4. The mitral valve is degenerative. Trivial mitral valve regurgitation. No evidence of mitral stenosis. 5. The aortic valve is tricuspid. There is mild calcification of the aortic valve. Aortic valve regurgitation is trivial. Mild to moderate aortic valve stenosis. Aortic valve area, by VTI measures 1.48 cm. Aortic valve mean gradient measures 16.5 mmHg. Aortic valve Vmax measures 2.79 m/s. 6. Aortic dilatation noted. There is mild dilatation of the ascending aorta, measuring 41 mm. 7. The inferior vena cava is  dilated in size with <50% respiratory variability, suggesting right atrial pressure of 15 mmHg.  FINDINGS Left Ventricle: Left ventricular ejection fraction, by estimation, is 55 to 60%. The left ventricle has normal function. The left ventricle has no regional wall motion abnormalities. The left ventricular internal cavity size was normal in size. There is mild concentric left ventricular hypertrophy. Left ventricular diastolic parameters are consistent with Grade II diastolic dysfunction (pseudonormalization).  Right Ventricle: The right ventricular size is moderately enlarged. No increase in right ventricular wall thickness. Right ventricular systolic function is mildly reduced.  Left Atrium: Left atrial size was mildly dilated.  Right Atrium: Right atrial size was normal in size.  Pericardium: There is no evidence of pericardial effusion.  Mitral Valve: The mitral valve is degenerative in appearance. There is mild calcification of the mitral valve leaflet(s). Mild mitral annular calcification. Trivial mitral valve regurgitation. No evidence of mitral valve stenosis.  Tricuspid Valve: The tricuspid valve is normal in structure. Tricuspid valve regurgitation is not demonstrated. No evidence of tricuspid stenosis.  Aortic Valve: The aortic valve is tricuspid. There is mild calcification of the aortic valve. Aortic valve regurgitation is trivial. Mild to moderate aortic stenosis is present. Aortic valve mean gradient measures 16.5 mmHg. Aortic valve peak gradient measures 31.1 mmHg. Aortic valve area, by VTI measures 1.48 cm.  Pulmonic Valve: The pulmonic valve was not well visualized. Pulmonic valve regurgitation is not visualized. No evidence of pulmonic stenosis.  Aorta: Aortic dilatation noted. There is mild dilatation of the ascending aorta, measuring 41 mm.  Venous: The inferior vena cava is dilated in size with less than 50% respiratory variability, suggesting right atrial pressure  of 15 mmHg.  IAS/Shunts: No atrial level shunt detected by color flow Doppler.   LEFT VENTRICLE PLAX 2D LVIDd:         5.17 cm LVIDs:         3.57 cm LV PW:         1.24 cm LV IVS:        1.32 cm LVOT diam:     2.10 cm LV SV:         82 LV SV Index:   33 LVOT Area:     3.46 cm   RIGHT VENTRICLE TAPSE (M-mode): 1.5 cm  LEFT ATRIUM             Index LA diam:        4.50 cm 1.79 cm/m LA Vol (A2C):   49.4 ml 19.61 ml/m LA Vol (A4C):   51.9 ml 20.60 ml/m LA Biplane Vol: 55.5 ml 22.03 ml/m AORTIC VALVE AV Area (Vmax):    1.32 cm AV Area (Vmean):   1.39 cm AV Area (VTI):     1.48 cm AV Vmax:           279.00 cm/s AV Vmean:          188.000 cm/s AV VTI:            0.557 m AV Peak Grad:      31.1 mmHg AV Mean Grad:      16.5 mmHg LVOT Vmax:         106.00 cm/s LVOT Vmean:        75.400 cm/s LVOT VTI:          0.238 m LVOT/AV VTI ratio: 0.43  AORTA Ao Root diam: 3.70 cm Ao Asc diam:  4.10 cm   SHUNTS Systemic VTI:  0.24 m Systemic Diam: 2.10 cm  Toribio Fuel MD Electronically signed by Toribio Fuel MD Signature Date/Time: 03/10/2024/10:35:57 AM    Final    MONITORS  CARDIAC EVENT MONITOR 09/03/2023  Narrative Mobile cardiac outpatient telemetry 30 days 09/03/2023 - 10/02/2023: Dominant rhythm: Sinus. 1 patient triggered episode correlated with sinus rhythm and first degree AV block 14 auto triggered episodes, including 8 episodes with first-degree and second-degree type I AV block (during awake hours).  No associated symptoms reported. 3% PVC burden. No associated symptoms reported. No other arrhythmias noted.       ______________________________________________________________________________________________      09/20/2023: ALT 14 11/01/2023: BUN 28; Creatinine, Ser 1.14; Hemoglobin 11.7; Platelets 183.0; Potassium 4.6; Sodium 135; TSH 0.96   STS RISK CALCULATOR: Pending  NHYA CLASS: 1/2    ASSESSMENT AND PLAN:   1. Nonrheumatic  aortic valve stenosis   2. Type 2 diabetes mellitus with complication, without long-term current use of insulin  (HCC)   3. Hypertension associated with diabetes (HCC)   4. Hyperlipidemia associated with type 2 diabetes mellitus (HCC)   5. CKD stage 3 secondary to diabetes (HCC)   6. PAF (paroxysmal atrial fibrillation) (HCC)   7. Secondary hypercoagulable state   8. BMI 40.0-44.9, adult [facility])      Aortic stenosis: The patient is doing fairly well.  He has mild shortness of breath when  he exerts himself more than moderately.  This does not really interrupt his activities of daily living. I did discuss involvement in the KATALYST trial and he is interested.  Otherwise I will see him back in 6 months with another echocardiogram. T2DM: Continue Xarelto  20 mg daily instead of aspirin , pravastatin  10 mg daily, lisinopril  10 mg daily Hypertension: Continue lisinopril  10 mg daily and increase hydrochlorothiazide  to 25.  Blood pressure above goal of 130/80 Hyperlipidemia: Continue pravastatin  10 mg daily; followed by patient's primary cardiologist. CKD stage III: Continue lisinopril  10 mg daily and Jardiance  10 mg. PAF: Continue Xarelto  20 mg daily.  Secondary hypercoagulable state: Continue Xarelto  20 mg daily. Elevated BMI: Refer to Pharm.D. for recommendation regarding GLP-1 receptor agonist therapy   I have personally reviewed the patients imaging data as summarized above.  I have reviewed the natural history of aortic stenosis with the patient and family members who are present today. We have discussed the limitations of medical therapy and the poor prognosis associated with symptomatic aortic stenosis. We have also reviewed potential treatment options, including palliative medical therapy, conventional surgical aortic valve replacement, and transcatheter aortic valve replacement. We discussed treatment options in the context of this patient's specific comorbid medical conditions.   All of the  patient's questions were answered today. Will make further recommendations based on the results of studies outlined above.    Marnisha Stampley K Lerry Cordrey, MD  03/10/2024 11:36 AM    [facility] [address] [place], [place], [place]  [address] Phone: [phone]; Fax: [phone]

## 2024-03-09 ENCOUNTER — Encounter: Payer: Self-pay | Admitting: Internal Medicine

## 2024-03-09 ENCOUNTER — Ambulatory Visit: Admitting: Internal Medicine

## 2024-03-09 ENCOUNTER — Ambulatory Visit (HOSPITAL_COMMUNITY)
Admission: RE | Admit: 2024-03-09 | Discharge: 2024-03-09 | Disposition: A | Discharge status: Home / Self Care | Source: Ambulatory Visit | Attending: Cardiology | Admitting: Cardiology

## 2024-03-09 VITALS — BP 144/70 | HR 50 | Ht 71.0 in | Wt 322.2 lb

## 2024-03-09 DIAGNOSIS — I480 Paroxysmal atrial fibrillation: Secondary | ICD-10-CM | POA: Insufficient documentation

## 2024-03-09 DIAGNOSIS — E1159 Type 2 diabetes mellitus with other circulatory complications: Secondary | ICD-10-CM | POA: Insufficient documentation

## 2024-03-09 DIAGNOSIS — E118 Type 2 diabetes mellitus with unspecified complications: Secondary | ICD-10-CM | POA: Insufficient documentation

## 2024-03-09 DIAGNOSIS — E1169 Type 2 diabetes mellitus with other specified complication: Secondary | ICD-10-CM

## 2024-03-09 DIAGNOSIS — N183 Chronic kidney disease, stage 3 unspecified: Secondary | ICD-10-CM | POA: Diagnosis not present

## 2024-03-09 DIAGNOSIS — Z6841 Body Mass Index (BMI) 40.0 and over, adult: Secondary | ICD-10-CM | POA: Diagnosis not present

## 2024-03-09 DIAGNOSIS — I152 Hypertension secondary to endocrine disorders: Secondary | ICD-10-CM | POA: Insufficient documentation

## 2024-03-09 DIAGNOSIS — E785 Hyperlipidemia, unspecified: Secondary | ICD-10-CM

## 2024-03-09 DIAGNOSIS — E1122 Type 2 diabetes mellitus with diabetic chronic kidney disease: Secondary | ICD-10-CM

## 2024-03-09 DIAGNOSIS — I350 Nonrheumatic aortic (valve) stenosis: Secondary | ICD-10-CM | POA: Diagnosis not present

## 2024-03-09 DIAGNOSIS — D6869 Other thrombophilia: Secondary | ICD-10-CM | POA: Diagnosis not present

## 2024-03-09 MED ORDER — PERFLUTREN LIPID MICROSPHERE
1.0000 mL | INTRAVENOUS | Status: AC | PRN
  Administered 2024-03-09: 2 mL via INTRAVENOUS

## 2024-03-09 MED ORDER — HYDROCHLOROTHIAZIDE 25 MG PO TABS: 25.0000 mg | ORAL_TABLET | Freq: Every day | ORAL | 3 refills | Status: AC

## 2024-03-09 NOTE — Patient Instructions (Signed)
 Medication Instructions:  INCREASE Hydrochlorothiazide  to 25 mg once daily   *If you need a refill on your cardiac medications before your next appointment, please call your pharmacy*  Lab Work: None ordered today. If you have labs (blood work) drawn today and your tests are completely normal, you will receive your results only by: MyChart Message (if you have MyChart) OR A paper copy in the mail If you have any lab test that is abnormal or we need to change your treatment, we will call you to review the results.  Testing/Procedures: Your physician has requested that you have an echocardiogram to be completed prior to 6 month follow-up. Echocardiography is a painless test that uses sound waves to create images of your heart. It provides your doctor with information about the size and shape of your heart and how well your heart's chambers and valves are working. This procedure takes approximately one hour. There are no restrictions for this procedure. Please do NOT wear cologne, perfume, aftershave, or lotions (deodorant is allowed). Please arrive 15 minutes prior to your appointment time.  Please note: We ask at that you not bring children with you during ultrasound (echo/ vascular) testing. Due to room size and safety concerns, children are not allowed in the ultrasound rooms during exams. Our front office staff cannot provide observation of children in our lobby area while testing is being conducted. An adult accompanying a patient to their appointment will only be allowed in the ultrasound room at the discretion of the ultrasound technician under special circumstances. We apologize for any inconvenience.   Follow-Up: At [facility], you and your health needs are our priority.  As part of our continuing mission to provide you with exceptional heart care, our providers are all part of one team.  This team includes your primary Cardiologist (physician) and Advanced Practice Providers or  APPs (Physician Assistants and Nurse Practitioners) who all work together to provide you with the care you need, when you need it.  Your next appointment:   6 month(s)  Provider:   Lurena Red, MD    We recommend signing up for the patient portal called MyChart.  Sign up information is provided on this After Visit Summary.  MyChart is used to connect with patients for Virtual Visits (Telemedicine).  Patients are able to view lab/test results, encounter notes, upcoming appointments, etc.  Non-urgent messages can be sent to your provider as well.   To learn more about what you can do with MyChart, go to [email or web address].   Other Instructions You have been referred to PharmD to discuss starting a GLP1 medication. Someone from our office will be in touch to schedule this appointment.

## 2024-03-10 ENCOUNTER — Ambulatory Visit: Payer: Self-pay | Admitting: Internal Medicine

## 2024-03-10 LAB — ECHOCARDIOGRAM COMPLETE
AR max vel: 1.32 cm2
AV Area VTI: 1.48 cm2
AV Area mean vel: 1.39 cm2
AV Mean grad: 16.5 mmHg
AV Peak grad: 31.1 mmHg
Ao pk vel: 2.79 m/s
S' Lateral: 3.57 cm

## 2024-03-23 ENCOUNTER — Encounter: Payer: Self-pay | Admitting: Family Medicine

## 2024-03-23 ENCOUNTER — Ambulatory Visit: Admitting: Family Medicine

## 2024-03-23 ENCOUNTER — Ambulatory Visit: Payer: Self-pay | Admitting: Family Medicine

## 2024-03-23 VITALS — BP 130/60 | HR 74 | Temp 97.7°F | Ht 71.0 in | Wt 319.0 lb

## 2024-03-23 DIAGNOSIS — E7800 Pure hypercholesterolemia, unspecified: Secondary | ICD-10-CM | POA: Diagnosis not present

## 2024-03-23 DIAGNOSIS — E119 Type 2 diabetes mellitus without complications: Secondary | ICD-10-CM

## 2024-03-23 DIAGNOSIS — M79645 Pain in left finger(s): Secondary | ICD-10-CM

## 2024-03-23 DIAGNOSIS — E039 Hypothyroidism, unspecified: Secondary | ICD-10-CM | POA: Diagnosis not present

## 2024-03-23 DIAGNOSIS — I350 Nonrheumatic aortic (valve) stenosis: Secondary | ICD-10-CM

## 2024-03-23 DIAGNOSIS — Z23 Encounter for immunization: Secondary | ICD-10-CM

## 2024-03-23 DIAGNOSIS — Z7984 Long term (current) use of oral hypoglycemic drugs: Secondary | ICD-10-CM

## 2024-03-23 DIAGNOSIS — Z9884 Bariatric surgery status: Secondary | ICD-10-CM | POA: Diagnosis not present

## 2024-03-23 DIAGNOSIS — I10 Essential (primary) hypertension: Secondary | ICD-10-CM

## 2024-03-23 DIAGNOSIS — M79644 Pain in right finger(s): Secondary | ICD-10-CM

## 2024-03-23 DIAGNOSIS — E538 Deficiency of other specified B group vitamins: Secondary | ICD-10-CM

## 2024-03-23 DIAGNOSIS — D508 Other iron deficiency anemias: Secondary | ICD-10-CM

## 2024-03-23 LAB — CBC WITH DIFFERENTIAL/PLATELET
Basophils Absolute: 0.1 K/uL (ref 0.0–0.1)
Basophils Relative: 1 % (ref 0.0–3.0)
Eosinophils Absolute: 0.2 K/uL (ref 0.0–0.7)
Eosinophils Relative: 2.8 % (ref 0.0–5.0)
HCT: 35.8 % — ABNORMAL LOW (ref 39.0–52.0)
Hemoglobin: 10.9 g/dL — ABNORMAL LOW (ref 13.0–17.0)
Lymphocytes Relative: 14.4 % (ref 12.0–46.0)
Lymphs Abs: 0.9 K/uL (ref 0.7–4.0)
MCHC: 30.3 g/dL (ref 30.0–36.0)
MCV: 78.7 fl (ref 78.0–100.0)
Monocytes Absolute: 0.7 K/uL (ref 0.1–1.0)
Monocytes Relative: 11.1 % (ref 3.0–12.0)
Neutro Abs: 4.6 K/uL (ref 1.4–7.7)
Neutrophils Relative %: 70.7 % (ref 43.0–77.0)
Platelets: 197 K/uL (ref 150.0–400.0)
RBC: 4.55 Mil/uL (ref 4.22–5.81)
RDW: 18.6 % — ABNORMAL HIGH (ref 11.5–15.5)
WBC: 6.6 K/uL (ref 4.0–10.5)

## 2024-03-23 LAB — COMPREHENSIVE METABOLIC PANEL WITH GFR
ALT: 15 U/L (ref 0–53)
AST: 17 U/L (ref 0–37)
Albumin: 4.1 g/dL (ref 3.5–5.2)
Alkaline Phosphatase: 59 U/L (ref 39–117)
BUN: 23 mg/dL (ref 6–23)
CO2: 25 meq/L (ref 19–32)
Calcium: 9.3 mg/dL (ref 8.4–10.5)
Chloride: 101 meq/L (ref 96–112)
Creatinine, Ser: 1.11 mg/dL (ref 0.40–1.50)
GFR: 66.98 mL/min (ref >60.00)
Glucose, Bld: 95 mg/dL (ref 70–99)
Potassium: 4.2 meq/L (ref 3.5–5.1)
Sodium: 134 meq/L — ABNORMAL LOW (ref 135–145)
Total Bilirubin: 0.5 mg/dL (ref 0.2–1.2)
Total Protein: 7.5 g/dL (ref 6.0–8.3)

## 2024-03-23 LAB — VITAMIN B12: Vitamin B-12: 413 pg/mL (ref 211–911)

## 2024-03-23 LAB — TSH: TSH: 2.41 u[IU]/mL (ref 0.35–5.50)

## 2024-03-23 LAB — VITAMIN D 25 HYDROXY (VIT D DEFICIENCY, FRACTURES): VITD: 27.11 ng/mL — ABNORMAL LOW (ref 30.00–100.00)

## 2024-03-23 LAB — IBC + FERRITIN
Ferritin: 12.7 ng/mL — ABNORMAL LOW (ref 22.0–322.0)
Iron: 39 ug/dL — ABNORMAL LOW (ref 42–165)
Saturation Ratios: 7 % — ABNORMAL LOW (ref 20.0–50.0)
TIBC: 557.2 ug/dL — ABNORMAL HIGH (ref 250.0–450.0)
Transferrin: 398 mg/dL — ABNORMAL HIGH (ref 212.0–360.0)

## 2024-03-23 LAB — HEMOGLOBIN A1C: Hgb A1c MFr Bld: 6.4 % (ref 4.6–6.5)

## 2024-03-23 MED ORDER — PRAVASTATIN SODIUM 10 MG PO TABS: 10.0000 mg | ORAL_TABLET | Freq: Every day | ORAL | 1 refills | Status: AC

## 2024-03-23 MED ORDER — LISINOPRIL 10 MG PO TABS: 10.0000 mg | ORAL_TABLET | Freq: Every day | ORAL | 1 refills | Status: AC

## 2024-03-23 MED ORDER — LEVOTHYROXINE SODIUM 200 MCG PO TABS: ORAL_TABLET | ORAL | 1 refills | Status: DC

## 2024-03-23 MED ORDER — DICLOFENAC SODIUM 1 % EX GEL: 2.0000 g | Freq: Four times a day (QID) | CUTANEOUS | 3 refills | Status: AC

## 2024-03-23 MED ORDER — METFORMIN HCL 1000 MG PO TABS: 1000.0000 mg | ORAL_TABLET | Freq: Every day | ORAL | 1 refills | Status: AC

## 2024-03-23 MED ORDER — EMPAGLIFLOZIN 10 MG PO TABS: 10.0000 mg | ORAL_TABLET | Freq: Every day | ORAL | 1 refills | Status: AC

## 2024-03-23 NOTE — Progress Notes (Signed)
 Subjective:     Patient ID: Dillon Jones, male    DOB: Nov 21, 1950, 73 y.o.   MRN: [id number]  Chief Complaint  Patient presents with   Diabetes    59mo follow up; only concern thumb joints in hands, arthritis is becoming annoying    HPI Discussed the use of AI scribe software for clinical note transcription with the patient, who gave verbal consent to proceed.  History of Present Illness Dillon Jones is a 73 year old male who presents for medication management and follow-up.  He recently visited a cardiologist where his blood pressure medications were adjusted. He is now taking hydrochlorothiazide  25 mg daily and lisinopril  10 mg daily. His blood pressure at the cardiologist's office was in the 130s. He does not monitor his blood pressure at home. No headaches, dizziness, chest pain, shortness of breath, or leg swelling.  For his thyroid  condition, he takes Synthroid  200 mcg daily, except on Mondays and Fridays when he takes 400 mcg. This dosing regimen has been in place for years. No heart palpitations, shakiness, or tremors.  Regarding his diabetes, he takes metformin  1000 mg once daily and empagliflozin  10 mg daily, although he recently ran out of the latter and needs a refill. He has regained some weight after previous gastric bypass surgery and knee replacements. Card working on GLP-1  He is on pravastatin  10 mg daily for cholesterol management and Xarelto  20 mg daily for atrial fibrillation, although he has not experienced atrial fibrillation episodes recently. He continues to take bariatric vitamins   Socially, he is working on reducing alcohol consumption, having cut it by half. He remains active and is working on weight loss. He has received pneumonia and shingles vaccinations and is due for a tetanus booster.  He reports worsening arthritis in both thumbs, particularly affecting his grip. He experiences difficulty when holding objects, such as a cup, and sometimes uses both  hands to manage. He cannot take ibuprofen due to Xarelto  but uses Tylenol  for relief.    Health Maintenance Due  Topic Date Due   DTaP/Tdap/Td (2 - Td or Tdap) 10/11/2021   COVID-19 Vaccine (5 - 2025-26 season) 02/10/2024   HEMOGLOBIN A1C  03/21/2024    Past Medical History:  Diagnosis Date   Anemia    Arthritis    Diabetes mellitus    Has had for 13 years, type 2   Diabetes mellitus-type II 02/06/2012   GERD (gastroesophageal reflux disease)    Occasional - only 1x q32m   H/O umbilical hernia repair-mesh used; done in [place] 02/06/2012   Heart murmur    Hypercholesteremia 02/06/2012   Hyperlipidemia    Hypertension    Hypothyroidism    Lap Roux Y Gastric Bypass Dec 2013 06/09/2012   Morbid obesity (HCC)    Morbid obesity with BMI of 55.8 06/05/2012   Neuromuscular disorder (HCC)    Neuropathy   OSA on CPAP 02/06/2012   Prostate cancer (HCC)    radioactive seeds   Sleep apnea     Past Surgical History:  Procedure Laterality Date   BREATH TEK H PYLORI  04/22/2012   Procedure: BREATH TEK H PYLORI;  Surgeon: Donnice KATHEE Lunger, MD;  Location: [facility];  Service: General;  Laterality: N/A;   CARDIAC CATHETERIZATION     GASTRIC ROUX-EN-Y  06/09/2012   Procedure: LAPAROSCOPIC ROUX-EN-Y GASTRIC;  Surgeon: Donnice KATHEE Lunger, MD;  Location: [facility];  Service: General;  Laterality: N/A;   HERNIA REPAIR  1992  umbilical   PROSTATE BIOPSY     RADIOACTIVE SEED IMPLANT N/A 12/09/2018   Procedure: RADIOACTIVE SEED IMPLANT/BRACHYTHERAPY IMPLANT;  Surgeon: Watt Rush, MD;  Location: [facility];  Service: Urology;  Laterality: N/A;   right knee arthroscopy  1999   RIGHT/LEFT HEART CATH AND CORONARY ANGIOGRAPHY N/A 08/16/2023   Procedure: RIGHT/LEFT HEART CATH AND CORONARY ANGIOGRAPHY;  Surgeon: Elmira Newman PARAS, MD;  Location: [facility];  Service: Cardiovascular;  Laterality: N/A;   SPACE OAR INSTILLATION N/A 12/09/2018   Procedure: SPACE OAR INSTILLATION;  Surgeon: Watt Rush, MD;  Location: [facility];  Service: Urology;  Laterality: N/A;   TOTAL KNEE ARTHROPLASTY Right 12/18/2022   Procedure: TOTAL KNEE ARTHROPLASTY;  Surgeon: Josefina Chew, MD;  Location: [facility];  Service: Orthopedics;  Laterality: Right;   TOTAL KNEE ARTHROPLASTY Left 04/23/2023   Procedure: LEFT TOTAL KNEE ARTHROPLASTY;  Surgeon: Josefina Chew, MD;  Location: [facility];  Service: Orthopedics;  Laterality: Left;   VENTRAL HERNIA REPAIR  06/09/2012   Procedure: LAPAROSCOPIC VENTRAL HERNIA;  Surgeon: Donnice KATHEE Lunger, MD;  Location: [facility];  Service: General;;  primary repair of ventral hernia     Current Outpatient Medications:    CALCIUM PO, Take 2 tablets by mouth daily., Disp: , Rfl:    diclofenac Sodium (VOLTAREN) 1 % GEL, Apply 2 g topically 4 (four) times daily., Disp: 300 g, Rfl: 3   hydrochlorothiazide  (HYDRODIURIL ) 25 MG tablet, Take 1 tablet (25 mg total) by mouth daily., Disp: 90 tablet, Rfl: 3   Multiple Vitamins-Minerals (BARIATRIC MULTIVITAMINS) CAPS, Take 2 capsules by mouth daily., Disp: , Rfl:    rivaroxaban  (XARELTO ) 20 MG TABS tablet, Take 1 tablet (20 mg total) by mouth daily with supper., Disp: 30 tablet, Rfl: 6   empagliflozin  (JARDIANCE ) 10 MG TABS tablet, Take 1 tablet (10 mg total) by mouth daily before breakfast., Disp: 90 tablet, Rfl: 1   levothyroxine  (SYNTHROID ) 200 MCG tablet, 1 tab po daily but 2 tabs on Monday and Friday, Disp: 125 tablet, Rfl: 1   lisinopril  (ZESTRIL ) 10 MG tablet, Take 1 tablet (10 mg total) by mouth daily., Disp: 90 tablet, Rfl: 1   metFORMIN  (GLUCOPHAGE ) 1000 MG tablet, Take 1 tablet (1,000 mg total) by mouth daily with breakfast., Disp: 90 tablet, Rfl: 1   pravastatin  (PRAVACHOL ) 10 MG tablet, Take 1 tablet (10 mg total) by mouth daily., Disp: 90 tablet, Rfl: 1  No Known Allergies ROS neg/noncontributory except as noted HPI/below      Objective:     BP 130/60   Pulse 74   Temp 97.7 F (36.5 C)   Ht 5' 11 (1.803 m)   Wt (!) 319 lb  (144.7 kg)   SpO2 98%   BMI 44.49 kg/m  Wt Readings from Last 3 Encounters:  03/23/24 (!) 319 lb (144.7 kg)  03/09/24 (!) 322 lb 3.2 oz (146.1 kg)  12/06/23 (!) 304 lb (137.9 kg)    Physical Exam   Gen: WDWN NAD HEENT: NCAT, conjunctiva not injected, sclera nonicteric NECK:  supple, no thyromegaly, no nodes, no carotid bruits CARDIAC: RRR, S1S2+, +2/6 sys murmur. DP 2+B LUNGS: CTAB. No wheezes ABDOMEN:  BS+, soft, NTND, No HSM, no masses. + um hernia EXT:  no edema MSK: no gross abnormalities.  NEURO: A&O x3.  CN II-XII intact.  PSYCH: normal mood. Good eye contact  Diabetic Foot Exam - Simple   Simple Foot Form Diabetic Foot exam was performed with the following findings: Yes 03/23/2024 10:28  AM  Visual Inspection No deformities, no ulcerations, no other skin breakdown bilaterally: Yes Sensation Testing See comments: Yes Pulse Check Posterior Tibialis and Dorsalis pulse intact bilaterally: Yes Comments Some dec sens toes L>R         Assessment & Plan:  Type 2 diabetes mellitus without complication, without long-term current use of insulin  (HCC) -     Empagliflozin ; Take 1 tablet (10 mg total) by mouth daily before breakfast.  Dispense: 90 tablet; Refill: 1 -     Comprehensive metabolic panel with GFR -     Hemoglobin A1c  Primary hypertension -     Lisinopril ; Take 1 tablet (10 mg total) by mouth daily.  Dispense: 90 tablet; Refill: 1  Iron deficiency anemia secondary to inadequate dietary iron intake -     CBC with Differential/Platelet -     IBC + Ferritin -     Vitamin B12  Vitamin B12 deficiency -     Vitamin B12  Acquired hypothyroidism -     Levothyroxine  Sodium; 1 tab po daily but 2 tabs on Monday and Friday  Dispense: 125 tablet; Refill: 1 -     TSH  Encounter for immunization -     Flu vaccine HIGH DOSE PF(Fluzone Trivalent)  Lap Roux Y Gastric Bypass Dec 2013 -     VITAMIN D  25 Hydroxy (Vit-D Deficiency, Fractures)  Hypercholesteremia -      metFORMIN  HCl; Take 1 tablet (1,000 mg total) by mouth daily with breakfast.  Dispense: 90 tablet; Refill: 1 -     Pravastatin  Sodium; Take 1 tablet (10 mg total) by mouth daily.  Dispense: 90 tablet; Refill: 1  Morbid obesity with BMI of 55.8  Nonrheumatic aortic valve stenosis  Long term current use of oral hypoglycemic drug  Pain of right thumb  Pain of left thumb  Other orders -     Diclofenac Sodium; Apply 2 g topically 4 (four) times daily.  Dispense: 300 g; Refill: 3  Assessment and Plan Assessment & Plan Type 2 diabetes mellitus  - chronic. controlled Type 2 diabetes is managed with metformin  1000 mg once daily and empagliflozin  10 mg daily. Considering GLP-1 agonists for additional weight loss and cardiovascular benefits, as studies show a 30% reduction in heart attack risk. Scheduled to discuss GLP-1 agonists with a pharmacist at the end of the month. Renew empagliflozin  prescription.  Morbid obesity, status post bariatric surgery   Significant weight loss post-bariatric surgery with a loss of 160 pounds, but further weight loss is needed. A sedentary lifestyle due to knee replacements contributed to weight gain. A cardiologist is involved in weight management.  Essential hypertension  - chronic. controlled Essential hypertension is managed with hydrochlorothiazide  25 mg daily. Blood pressure at the clinic is 130/60 mmHg. He does not monitor blood pressure at home. Encourage home blood pressure monitoring and renew lisinopril  prescription.  Atrial fibrillation, on chronic anticoagulation   Atrial fibrillation is managed with Xarelto  20 mg daily. No recent episodes, but anticoagulation continues as per cardiology advice. .  Hypothyroidism  -chronic. controlled Hypothyroidism is managed with Synthroid , taking 200 mcg daily and 400 mcg on Mondays and Fridays. Adjust the prescription to reflect the actual dosing regimen to avoid running out of medication, with 125 tablets for a  30-month supply.  Osteoarthritis of both thumbs   Osteoarthritis of both thumbs with worsening grip strength. Cannot take NSAIDs due to anticoagulation therapy. Prescribe Voltaren gel for topical use up to four times daily and recommend  Tylenol  for pain management. Consider referral to sports medicine or orthopedics if symptoms progress.  General Health Maintenance   Due for a tetanus booster and advised to continue reducing alcohol consumption, as there is no medical indication for alcohol consumption and more harm than benefit. Advise tetanus booster (TDAP) at the pharmacy and encourage continued reduction of alcohol consumption.    Return in about 6 months (around 09/21/2024) for chronic follow-up.  Jenkins CHRISTELLA Carrel, MD

## 2024-03-23 NOTE — Progress Notes (Signed)
 Sugars doing ok.  Thyroid  fine Iron levels just not coming up-either not absorbing or some blood possibly GI tract.  Refer the hematology and GI for further eval.

## 2024-03-23 NOTE — Patient Instructions (Signed)
 It was very nice to see you today!  Tdap at pharmacy   PLEASE NOTE:  If you had any lab tests please let us  know if you have not heard back within a few days. You may see your results on MyChart before we have a chance to review them but we will give you a call once they are reviewed by us . If we ordered any referrals today, please let us  know if you have not heard from their office within the next week.   Please try these tips to maintain a healthy lifestyle:  Eat most of your calories during the day when you are active. Eliminate processed foods including packaged sweets (pies, cakes, cookies), reduce intake of potatoes, white bread, white pasta, and white rice. Look for whole grain options, oat flour or almond flour.  Each meal should contain half fruits/vegetables, one quarter protein, and one quarter carbs (no bigger than a computer mouse).  Cut down on sweet beverages. This includes juice, soda, and sweet tea. Also watch fruit intake, though this is a healthier sweet option, it still contains natural sugar! Limit to 3 servings daily.  Drink at least 1 glass of water with each meal and aim for at least 8 glasses per day  Exercise at least 150 minutes every week.

## 2024-03-24 ENCOUNTER — Other Ambulatory Visit: Payer: Self-pay | Admitting: Family Medicine

## 2024-03-24 DIAGNOSIS — E039 Hypothyroidism, unspecified: Secondary | ICD-10-CM

## 2024-04-03 NOTE — Telephone Encounter (Signed)
 Referrals for GI and Hematology sent per Dr Wendolyn.

## 2024-04-10 ENCOUNTER — Ambulatory Visit: Attending: Cardiovascular Disease | Admitting: Pharmacist

## 2024-04-10 NOTE — Patient Instructions (Signed)

## 2024-04-10 NOTE — Progress Notes (Signed)
 Patient ID: Dillon Jones                 DOB: 1950/06/12                    MRN: [id number]     HPI: Dillon Jones is a 73 y.o. male patient referred to pharmacy clinic by Dr. Wendel to initiate GLP1-RA therapy. PMH is significant for DM, AS, HTN, HLD, PAF, gastric bypass and obesity. Most recent BMI 44.4 kg/m .  Patient interested in learning more about GLP-1 since he heard about the cardiovascular benefits.  He had increase in his A1c recently.  Had knee surgery last year and is now getting back to going to the gym 3 times a week.  Rides the bike for 20 minutes and does weight machines as well.  Diet:  Mostly low carb, lots of fresh vegetables Breakfast: egg and toast Lunch: skip or cottage cheese Dinner: enchiladas, meatloaf, chicken and dumplings (low carb), hamburger Drink: 1/2 glass of milk, 3-4 cups of coffee (zero sugar creamer), water  Snack: very little  Exercise: gym 3 times a week, weight machines, and bike for 20 min  Family History:  Family History  Problem Relation Age of Onset   Breast cancer Mother    Vision loss Mother    Varicose Veins Mother    Heart disease Father        rhuematic heart disease   Diabetes Father    Hypertension Father    Obesity Father    Varicose Veins Father    Diabetes Brother        2 brothers   Hypertension Brother    Obesity Brother    Diabetes Maternal Grandmother    Obesity Brother    Stroke Brother    Prostate cancer Neg Hx    Colon cancer Neg Hx    Pancreatic cancer Neg Hx      Social History: no tobacco, couple drinks 4-5 times per week  Labs: Lab Results  Component Value Date   HGBA1C 6.4 03/23/2024    Wt Readings from Last 1 Encounters:  03/23/24 (!) 319 lb (144.7 kg)    BP Readings from Last 1 Encounters:  03/23/24 130/60   Pulse Readings from Last 1 Encounters:  03/23/24 74       Component Value Date/Time   CHOL 192 09/20/2023 1008   TRIG 77.0 09/20/2023 1008   HDL 101.00 09/20/2023 1008    CHOLHDL 2 09/20/2023 1008   VLDL 15.4 09/20/2023 1008   LDLCALC 75 09/20/2023 1008    Past Medical History:  Diagnosis Date   Anemia    Arthritis    Diabetes mellitus    Has had for 13 years, type 2   Diabetes mellitus-type II 02/06/2012   GERD (gastroesophageal reflux disease)    Occasional - only 1x q16m   H/O umbilical hernia repair-mesh used; done in [place] 02/06/2012   Heart murmur    Hypercholesteremia 02/06/2012   Hyperlipidemia    Hypertension    Hypothyroidism    Lap Roux Y Gastric Bypass Dec 2013 06/09/2012   Morbid obesity (HCC)    Morbid obesity with BMI of 55.8 06/05/2012   Neuromuscular disorder (HCC)    Neuropathy   OSA on CPAP 02/06/2012   Prostate cancer (HCC)    radioactive seeds   Sleep apnea     Current Outpatient Medications on File Prior to Visit  Medication Sig Dispense Refill   CALCIUM PO Take 2  tablets by mouth daily.     diclofenac Sodium (VOLTAREN) 1 % GEL Apply 2 g topically 4 (four) times daily. 300 g 3   empagliflozin  (JARDIANCE ) 10 MG TABS tablet Take 1 tablet (10 mg total) by mouth daily before breakfast. 90 tablet 1   hydrochlorothiazide  (HYDRODIURIL ) 25 MG tablet Take 1 tablet (25 mg total) by mouth daily. 90 tablet 3   levothyroxine  (SYNTHROID ) 200 MCG tablet 1 tab po daily but 2 tabs on Monday and Friday 125 tablet 1   lisinopril  (ZESTRIL ) 10 MG tablet Take 1 tablet (10 mg total) by mouth daily. 90 tablet 1   metFORMIN  (GLUCOPHAGE ) 1000 MG tablet Take 1 tablet (1,000 mg total) by mouth daily with breakfast. 90 tablet 1   Multiple Vitamins-Minerals (BARIATRIC MULTIVITAMINS) CAPS Take 2 capsules by mouth daily.     pravastatin  (PRAVACHOL ) 10 MG tablet Take 1 tablet (10 mg total) by mouth daily. 90 tablet 1   rivaroxaban  (XARELTO ) 20 MG TABS tablet Take 1 tablet (20 mg total) by mouth daily with supper. 30 tablet 6   No current facility-administered medications on file prior to visit.    No Known Allergies   Assessment/Plan:  1.  Weight loss - Patient has not met goal of at least 5% of body weight loss with comprehensive lifestyle modifications alone in the past 3-6 months. Pharmacotherapy is appropriate to pursue as augmentation. Will start Mounjaro 2.5 mg weekly. Confirmed patient has no personal or family history of medullary thyroid  carcinoma (MTC) or Multiple Endocrine Neoplasia syndrome type 2 (MEN 2).  No personal history of pancreatitis or gallstones.  Injection technique reviewed at today's visit.  We discussed that this would most likely be a long-term medication in order to reap the full cardiovascular benefit.  Also discussed weight gain risk when coming off of medication.  Could most likely stop or at least at first decrease metformin  dose if GLP-1 was initiated.  Will just need to monitor more closely for side effects due to his history of gastric bypass.  We did discuss the potential cost for next year and that he will almost certainly hit the 2100 out-of-pocket max for 2026.  Advised patient on common side effects including nausea, diarrhea, dyspepsia, decreased appetite, and fatigue. Counseled patient on reducing meal size and how to titrate medication to minimize side effects. Counseled patient to call if intolerable side effects or if experiencing dehydration, abdominal pain, or dizziness. Along with pharmacotherapy, the patient will follow dietary modifications and aim for at least 150 minutes of moderate-intensity exercise per week, plus resistance training twice a week (as recommended by the American Heart Association). This resistance training--such as weightlifting, bodyweight exercises, or using resistance bands, adapted to the patient's ability--will help prevent muscle loss.  Follow up in 1-2 days regarding coverage of Mounjaro 2.5 mg. If therapy is initiated, phone follow-ups will be conducted every 4 weeks for dose titration until the patient reaches the effective therapeutic dose and target  weight.  Saisha Hogue D Avier Jech, Pharm.JONETTA SARAN, CPP [facility] A Division of [facility] [address] [place], [place] [address]  Phone: [phone]; Fax: [phone]

## 2024-04-13 ENCOUNTER — Other Ambulatory Visit (HOSPITAL_COMMUNITY): Payer: Self-pay

## 2024-04-13 ENCOUNTER — Telehealth: Payer: Self-pay

## 2024-04-13 NOTE — Telephone Encounter (Signed)
 Pharmacy Patient Advocate Encounter   Received notification from Physician's Office that prior authorization for MOUNJARO is required/requested.   Insurance verification completed.   The patient is insured through [facility] AD[employer]/RX ADVANCE.   Per test claim: PA required; PA submitted to above mentioned insurance via Latent Key/confirmation #/EOC [id number] Status is pending

## 2024-04-13 NOTE — Telephone Encounter (Signed)
-----   Message from Eleanor JONETTA Crews sent at 04/10/2024  3:42 PM EDT ----- Please do PA for Mounjaro

## 2024-04-14 ENCOUNTER — Other Ambulatory Visit (HOSPITAL_COMMUNITY): Payer: Self-pay

## 2024-04-14 NOTE — Telephone Encounter (Signed)
 Pharmacy Patient Advocate Encounter  Received notification from [employer] that Prior Authorization for MOUNJARO has been APPROVED from 04/13/24 to 04/13/24. Ran test claim, Copay is $0. This test claim was processed through [facility]- copay amounts may vary at other pharmacies due to pharmacy/plan contracts, or as the patient moves through the different stages of their insurance plan.

## 2024-04-15 ENCOUNTER — Other Ambulatory Visit: Payer: Self-pay | Admitting: Internal Medicine

## 2024-04-15 DIAGNOSIS — Z006 Encounter for examination for normal comparison and control in clinical research program: Secondary | ICD-10-CM

## 2024-04-15 MED ORDER — MOUNJARO 2.5 MG/0.5ML ~~LOC~~ SOAJ: 2.5000 mg | SUBCUTANEOUS | 0 refills | Status: DC

## 2024-04-15 NOTE — Addendum Note (Signed)
 Addended by: Adda Stokes D on: 04/15/2024 11:01 AM   Modules accepted: Orders

## 2024-04-15 NOTE — Addendum Note (Signed)
 Addended by: ARDEAN NO B on: 04/15/2024 12:18 PM   Modules accepted: Orders

## 2024-04-15 NOTE — Addendum Note (Signed)
 Addended by: ARDEAN NO B on: 04/15/2024 01:22 PM   Modules accepted: Orders

## 2024-04-15 NOTE — Telephone Encounter (Signed)
 Called and LVM per DPR. Rx sent to pharmacy.

## 2024-04-15 NOTE — Research (Signed)
 Orders only

## 2024-04-17 ENCOUNTER — Other Ambulatory Visit (HOSPITAL_COMMUNITY): Payer: Self-pay

## 2024-04-17 MED ORDER — MOUNJARO 2.5 MG/0.5ML ~~LOC~~ SOAJ
2.5000 mg | SUBCUTANEOUS | 0 refills | Status: DC
  Filled 2024-04-17: qty 2, 28d supply, fill #0

## 2024-04-17 NOTE — Telephone Encounter (Signed)
 His pharmacy wouldn't fill. Spoke with patient and will send to [facility]

## 2024-04-20 ENCOUNTER — Ambulatory Visit (HOSPITAL_COMMUNITY)
Admission: RE | Admit: 2024-04-20 | Discharge: 2024-04-20 | Disposition: A | Source: Ambulatory Visit | Attending: Internal Medicine

## 2024-04-20 ENCOUNTER — Encounter

## 2024-04-20 ENCOUNTER — Ambulatory Visit (HOSPITAL_COMMUNITY)

## 2024-04-20 VITALS — BP 116/60 | HR 81 | Temp 99.5°F | Resp 16 | Ht 71.0 in | Wt 319.9 lb

## 2024-04-20 DIAGNOSIS — Z006 Encounter for examination for normal comparison and control in clinical research program: Secondary | ICD-10-CM

## 2024-04-20 MED ORDER — PERFLUTREN LIPID MICROSPHERE
1.0000 mL | INTRAVENOUS | Status: AC | PRN
Start: 2024-04-20 — End: 2024-04-20
  Administered 2024-04-20: 2 mL via INTRAVENOUS

## 2024-04-20 NOTE — Research (Addendum)
 [facility] Informed Consent   Subject Name: Dillon Jones  Subject met inclusion and exclusion criteria.  The informed consent form, study requirements and expectations were reviewed with the subject and questions and concerns were addressed prior to the signing of the consent form.  The subject verbalized understanding of the trial requirements.  The subject agreed to participate in the [facility] trial and signed the informed consent at 0745 on 04/20/2024.  The informed consent was obtained prior to performance of any protocol-specific procedures for the subject.  A copy of the signed informed consent was given to the subject and a copy was placed in the subject's medical record.      Inclusion  [x]  Is an adult male or male 73 years of age  [x]  Able to understand and comply with all study procedures,understand the risks involved in the study, and provide signed and dated written informed consent in accordance with local regulations before the conduct of any study related procedures. [x]  Has moderate CAVS as defined by having the following at Screening, as confirmed by the Echocardiogram Core Laboratory: An AVA of 1 cm2 to 1.50 cm2 on Doppler echocardiography; and An AVC score between 600 to 1200 Agatston units (AU) for women (persons assigned male at birth) and between 43 to 53 AU for men (persons assigned male at birth) on computed tomography (CT). [x]  Has a left ventricular ejection fraction of 45% at the time of Screening as determined by the Echocardiography Core Laboratory [x]  For participants on beta blockade, the dose must be stable for at least 90 days prior to the Screening Visit with no anticipated changes during the study. [x]  Male participants must be either surgically sterile (hysterectomy, bilateral tubal ligation, salpingectomy, and/or bilateral oophorectomy at least 26 weeks before Screening) or postmenopausal, defined as spontaneous amenorrhea for at  least 12 months with follicle-stimulating hormone [facility]) in postmenopausal range at Screening and agree to abstain from egg donation during their participation on the study and for a time period of 3 months after administration of the last dose of study drug. In the absence of 12 months of amenorrhea, confirmation with 2 FSH measurements in the postmenopausal range is required. [x]  Male participants with male partners that are of childbearing potential must agree to practice highly effective methods of contraception and agree to abstain from sperm donation during their participation in the study and for at least 3 months after the last dose of study drug [x]  Has an estimated glomerular filtration rate (eGFR) 30 mL/min/1.73 m2 as measured by the Chronic Kidney Disease Epidemiology Collaboration 2021 formula at the time of Screening; and [x]  For participants in the contrast cardiac CT sub-study only: Must have eGFR 40 mL/min/1.73 m2 at Screening and no other contraindication to receiving intravenous (IV) contrast yearly over the duration of the study.    Exclusion  []  Has any medical or physical condition that, in the Investigator's opinion, could lead to an inability to complete Protocol-required CPET procedures (eg, pulmonary disease, joint, leg, hip, back conditions that limit physical activity, or other absolute contraindications for CPET [Appendix C]); []  Has a prior aortic valve replacement, repair, surgery, or intervention or an anticipated or planned aortic valve replacement in the next 6 months; []  Has concomitant moderate, moderate-to-severe, or severe (Grade 2 or higher) mitral stenosis, mitral regurgitation, and/or aortic regurgitation as determined by the Cardiac Core Laboratory; []  Has known congenital aortic valve disease including bicuspid aortic valve; []  Has evidence of decompensated heart failure including any of the  following: [place]  Heart Association class III or class IV heart  failure symptoms; Heart failure hospitalization or requirement for IV diuretics within 60 days of the time of Screening or during the Screening Period; or A requirement for routine, scheduled outpatient IV infusions for heart failure (ie, inotropes, vasodilators, or diuretics).  []  Has a primary etiology for heart failure other than aortic valve disease such as cardiomyopathy, including amyloid cardiomyopathy, congenital heart disease, hypertrophic cardiomyopathy, ischemic cardiomyopathy, or myocarditis. []  Has known and untreated obstructive coronary artery disease (70% stenosis in a major epicardial coronary artery or 50% stenosis in the left main coronary artery, except for chronic total occlusions in the absence of symptoms). []  Are anticipating or planning coronary stenting or coronary artery bypass graft surgery within 6 months prior to Screening or during the study. []  Has an average QTcF 480 msec at Screening according to the Investigator's assessment and/or any other electrocardiogram (ECG) abnormality considered by the Investigator to pose a risk to participant safety. []  Has long-standing persistent or permanent atrial fibrillation; Note: participants with paroxysmal or persistent atrial fibrillation may be eligible if, in the opinion of the Investigator, normal sinus rhythm can be maintained during the conduct of CPET and imaging procedures. []  Has a life expectancy <2 years. []  Is a woman of childbearing potential or is pregnant or breastfeeding. []  Is unable to undergo a required imaging procedure (either echocardiography or multi-slice CT) according to the physician's judgment; [] Has any clinically significant abnormal findings on physical examination as judged by the Investigator and/or vital signs recorded at Screening of the following:  A mean systolic blood pressure after a triplicate recording of <90 mmHg or 180 mmHg; or  A mean diastolic blood pressure after a triplicate  recording of >90 mmHg. []  Have active liver disease, defined as any known current infectious, neoplastic, or metabolic, pathology of the liver; unexplained elevations in alanine aminotransferase (ALT) or aspartate aminotransferase (AST) >3  upper limit of normal (ULN); or total bilirubin >2  ULN at Screening; Note: Isolated hepatic steatosis is not exclusionary. Note: An abnormal ALT, AST, or total bilirubin must be confirmed by a repeat abnormal measurement at least 1 week apart. Note: Participants with confirmed Bertrum syndrome and total bilirubin >1.5  ULN can be included if their direct bilirubin at the time of Screening is ULN. []  Have a glycated hemoglobin 10.0% (0.100 hemoglobin fraction) or a fasting glucose 270 mg/dL (84.9 mmol/L) at Screening; []  Have a thyroid -stimulating hormone >1.5  ULN at Screening []  Have a creatine kinase >3  ULN at Screening []  Positive for hepatitis B surface antigen or hepatitis C virus (HCV) antibody (anti-HCV) plus HCV-RNA. Participants who are anti-HCV positive but HCV-RNA negative (secondary to treatment or natural viral clearance) are eligible with at least a 1-year period since documented date of RNA polymerase chain reaction negative confirmation following conclusion of treatment []  Has a history of malignancy, except participants who have been disease-free for >5 years prior to the time of Screening, or participants whose only malignancy has been successfully treated basal or squamous cell skin carcinoma []  Has a known allergy or intolerance to ataciguat or other soluble guanylate-cyclase modulating drugs; []  Is currently using a vitamin K antagonist (ie, warfarin) for chronic anticoagulation []  Is currently using phenytoin, midazolam , monoamine oxidase inhibitors, sulfonylurea class anti diabetic drugs, or fluvastatin; []  Has any medical condition that, in the opinion of the Investigator, could lead to difficulty complying with Protocol  procedures, will interfere with the safe completion of  the study, or could affect the validity of the endpoint assessments;  []  Is currently participating in or has participated in another clinical study, is using an investigational device, and/or is receiving other investigational treatments within 30 days prior to Screening or within 5 elimination half-lives of the investigational treatment, whichever is longer []  Is employed by or is a first-degree relative of someone employed by [employer], or their staff or family; []  For participants in the contrast cardiac CT sub-study only: Must have no contraindication to receiving IV contrast including the following:       A. Allergy to iodinated contrast; B. History of contrast-induced nephropathy; or       C. Inability to hold breath for at least 6 seconds.    12 Lead EKG: x 3     [x]  Labs Drawn     No AE per PI   [x]  CPET   [x]  Echo   NYHA Class [x]  l []  ll []  lll []  lv   [x]  Remote Cardiac Patch Application-Applied last    Research PE exam   Patient seen an examined   Physical Exam:   NYHA:  [x] 1 [] 2[] 3 [] 4     GEN: No acute distress, AO x 3 HEENT:  MMM, no JVD, no scleral icterus Cardiac: RRR, 2/6 systolic murmur Respiratory: Clear to auscultation bilaterally. GI: Soft, nontender, non-distended  MS: No edema; No deformity. Neuro:  Nonfocal  Vasc:  +2 radial pulses      The patient was given the opportunity to ask any further questions about the study that were not addressed by the research nurse coordinators - he has no further questions and wants to proceed at this time.

## 2024-04-21 NOTE — Addendum Note (Signed)
 Addended by: ARDEAN NO B on: 04/21/2024 11:15 AM   Modules accepted: Orders

## 2024-04-27 ENCOUNTER — Encounter (HOSPITAL_COMMUNITY): Payer: Self-pay

## 2024-04-29 ENCOUNTER — Ambulatory Visit (HOSPITAL_COMMUNITY)
Admission: RE | Admit: 2024-04-29 | Discharge: 2024-04-29 | Disposition: A | Discharge status: Home / Self Care | Source: Ambulatory Visit | Attending: Internal Medicine | Admitting: Internal Medicine

## 2024-04-29 DIAGNOSIS — Z006 Encounter for examination for normal comparison and control in clinical research program: Secondary | ICD-10-CM

## 2024-04-29 MED ORDER — IOHEXOL 350 MG/ML SOLN
100.0000 mL | Freq: Once | INTRAVENOUS | Status: AC | PRN
  Administered 2024-04-29: 100 mL via INTRAVENOUS

## 2024-05-01 DIAGNOSIS — Z006 Encounter for examination for normal comparison and control in clinical research program: Secondary | ICD-10-CM

## 2024-05-01 LAB — ECHOCARDIOGRAM COMPLETE
AR max vel: 1.17 cm2
AV Area VTI: 1.23 cm2
AV Area mean vel: 1.18 cm2
AV Mean grad: 44 mmHg
AV Peak grad: 59.4 mmHg
Ao pk vel: 3.85 m/s
S' Lateral: 3.8 cm
Weight: 5118.2 [oz_av]

## 2024-05-01 NOTE — Research (Addendum)
 [employer] Research Study   Subject has screen failed due to AVC > 2000 AU for males

## 2024-05-02 ENCOUNTER — Ambulatory Visit: Payer: Self-pay | Admitting: Internal Medicine

## 2024-05-04 ENCOUNTER — Encounter

## 2024-05-06 ENCOUNTER — Other Ambulatory Visit (HOSPITAL_COMMUNITY): Payer: Self-pay

## 2024-05-06 ENCOUNTER — Telehealth: Payer: Self-pay | Admitting: Pharmacist

## 2024-05-06 MED ORDER — MOUNJARO 5 MG/0.5ML ~~LOC~~ SOAJ
5.0000 mg | SUBCUTANEOUS | 0 refills | Status: DC
  Filled 2024-05-06: qty 2, 28d supply, fill #0

## 2024-05-06 NOTE — Telephone Encounter (Signed)
 Patient doing well on Mounjaro  2.5mg . No side effects.  Would like to increase to 5mg . Will send rx

## 2024-05-20 NOTE — Progress Notes (Signed)
 Patient ID: Dillon Jones MRN: [id number] DOB/AGE: 08-28-1950 73 y.o.  Primary Care Physician:Kulik, Jenkins Jansky, MD Primary Cardiologist: [place]  CC:  Aortic valvular disease management     FOCUSED PROBLEM LIST:   Aortic stenosis AVA 0.92, MG 32 DI 0.24 V-max 3.7 EF 50 to 55% TTE February 2025 AVA 1.48, MG 16, V-max 2.79, EF 55-60% September 2025 AVA 1.23, MG 44, DI 0.27, EF 60 to 65% November 2025 EKG atrial fibrillation without bundle-branch blocks and occasional PVCs Type II DM On metformin  Hypertension Hyperlipidemia CKD stage IIIa PAF CV 2 score 3 On Xarelto  BMI 41/BSA 2.15 August 2023:  Patient consents to use of AI scribe. The patient was seen by his primary cardiologist last month.  He had developed some shortness of breath and chest tightness.  His echocardiogram demonstrated a dimensionless index of less than 0.25.  He was referred for cardiac catheterization which showed no obstructive coronary artery disease.  He is here for recommendations regarding his severe symptomatic aortic stenosis.  On Valentine's Day, he experienced shortness of breath, chest tightness, and an inability to take a deep breath, prompting an urgent care visit. Diagnostic tests, including an EKG and troponin tests, showed elevated but not rising troponin levels. His symptoms resolved by the end of the visit.  He did have children at his house and thought he may have developed a URI.  Currently, no shortness of breath during daily activities, such as walking or performing household tasks, and no lightheadedness or difficulty breathing when lying flat. He is hesitant to return to the gym due to these recent events but plans to resume exercise.  He has a history of atrial fibrillation and is currently on Xarelto , which he resumed after a recent cardiac catheterization that did not reveal any severe blockages. He has not experienced a stroke and was not in atrial fibrillation during the Valentine's  Day episode.  He has type 2 diabetes, managed with metformin , and reports a stable hemoglobin A1c of 5.5% for years following gastric bypass surgery 14 years ago. He has not discussed discontinuing metformin  with his primary care provider.  He has high cholesterol, managed with pravastatin , and high blood pressure, managed with multiple medications. He is aware of having mildly impaired kidney function.  In terms of social history, he is retired and previously worked as a publishing rights manager. He enjoys cooking and is active in daily life, though his activity is limited by knee issues. He has undergone knee surgeries, with the most recent one in November, and his wife is scheduled for knee surgery soon.  He has a history of cavities but denies any dental symptoms currently.  Plan: Follow-up in 6 months.  September 2025:  Patient consents to use of AI scribe. He was seen in June and was doing well and without cardiovascular complaints.  He experiences occasional exertional dyspnea, which resolves with rest. No chest pain, lightheadedness, or orthopnea.  His blood pressure is typically in the 130s, but it was 144 during the visit. He is on hydrochlorothiazide  12.5 mg daily.  He has diabetes managed with metformin  and Jardiance . His hemoglobin A1c is in the 5s, attributed to a gastric bypass surgery 12 years ago. He notes recent weight gain due to dietary changes while caring for his wife post-surgery.  He is on anticoagulation therapy and reports skin tenderness but no excessive bleeding from minor injuries.  His knees have improved following bilateral knee replacement surgeries, with the last surgery in November,  and he can now walk without issues.  Plan: Continue current therapy.  Referred to pharmacy for GLP-1 receptor agonist therapy.    December 2025:  Patient consents to use of AI scribe. In the interim the patient unfortunately screen failed for the Katalyst trial.  He was seen  by pharmacy and started on Mounjaro .  His echocardiogram done in November demonstrated severe aortic stenosis with a mean gradient of greater than 40 mmHg.  He has experienced increased fatigue and tires more easily since his last visit. No lightheadedness is reported.  About twelve days ago, he experienced an episode of chest discomfort, described as a tight feeling in the middle of the night on a Saturday. The discomfort eased when he sat up and resolved by morning.  He denies ongoing issues with breathing while lying flat, except for one night when he had to sleep in a recliner. He has not needed to sleep in the recliner since then.  He does not feel palpitations and does not typically feel his atrial fibrillation. He recalls a similar episode in February when he felt short of breath and went to the emergency room, where he was found to be in sinus rhythm by the time an EKG was performed.  He also mentions regular dental visits and a potential need for dental work on a wisdom tooth.   Past Medical History:  Diagnosis Date   Anemia    Arthritis    Diabetes mellitus    Has had for 13 years, type 2   Diabetes mellitus-type II 02/06/2012   GERD (gastroesophageal reflux disease)    Occasional - only 1x q72m   H/O umbilical hernia repair-mesh used; done in [place] 02/06/2012   Heart murmur    Hypercholesteremia 02/06/2012   Hyperlipidemia    Hypertension    Hypothyroidism    Lap Roux Y Gastric Bypass Dec 2013 06/09/2012   Morbid obesity (HCC)    Morbid obesity with BMI of 55.8 06/05/2012   Neuromuscular disorder (HCC)    Neuropathy   OSA on CPAP 02/06/2012   Prostate cancer (HCC)    radioactive seeds   Sleep apnea     Past Surgical History:  Procedure Laterality Date   BREATH TEK H PYLORI  04/22/2012   Procedure: BREATH TEK H PYLORI;  Surgeon: Donnice KATHEE Lunger, MD;  Location: [facility];  Service: General;  Laterality: N/A;   CARDIAC CATHETERIZATION     GASTRIC ROUX-EN-Y   06/09/2012   Procedure: LAPAROSCOPIC ROUX-EN-Y GASTRIC;  Surgeon: Donnice KATHEE Lunger, MD;  Location: [facility];  Service: General;  Laterality: N/A;   HERNIA REPAIR  1992   umbilical   PROSTATE BIOPSY     RADIOACTIVE SEED IMPLANT N/A 12/09/2018   Procedure: RADIOACTIVE SEED IMPLANT/BRACHYTHERAPY IMPLANT;  Surgeon: Watt Rush, MD;  Location: [facility];  Service: Urology;  Laterality: N/A;   right knee arthroscopy  1999   RIGHT/LEFT HEART CATH AND CORONARY ANGIOGRAPHY N/A 08/16/2023   Procedure: RIGHT/LEFT HEART CATH AND CORONARY ANGIOGRAPHY;  Surgeon: El[place]ira Newman PARAS, MD;  Location: [facility];  Service: Cardiovascular;  Laterality: N/A;   SPACE OAR INSTILLATION N/A 12/09/2018   Procedure: SPACE OAR INSTILLATION;  Surgeon: Watt Rush, MD;  Location: [facility];  Service: Urology;  Laterality: N/A;   TOTAL KNEE ARTHROPLASTY Right 12/18/2022   Procedure: TOTAL KNEE ARTHROPLASTY;  Surgeon: Josefina Chew, MD;  Location: [facility];  Service: Orthopedics;  Laterality: Right;   TOTAL KNEE ARTHROPLASTY Left 04/23/2023   Procedure: LEFT TOTAL  KNEE ARTHROPLASTY;  Surgeon: Josefina Chew, MD;  Location: [facility];  Service: Orthopedics;  Laterality: Left;   VENTRAL HERNIA REPAIR  06/09/2012   Procedure: LAPAROSCOPIC VENTRAL HERNIA;  Surgeon: Donnice KATHEE Lunger, MD;  Location: [facility];  Service: General;;  primary repair of ventral hernia    Family History  Problem Relation Age of Onset   Breast cancer Mother    Vision loss Mother    Varicose Veins Mother    Heart disease Father        rhuematic heart disease   Diabetes Father    Hypertension Father    Obesity Father    Varicose Veins Father    Diabetes Brother        2 brothers   Hypertension Brother    Obesity Brother    Diabetes Maternal Grandmother    Obesity Brother    Stroke Brother    Prostate cancer Neg Hx    Colon cancer Neg Hx    Pancreatic cancer Neg Hx     Social History   Socioeconomic History   Marital status: Married     Spouse name: Not on file   Number of children: 3   Years of education: Not on file   Highest education level: Doctorate  Occupational History    Comment: working full time  Tobacco Use   Smoking status: Never    Passive exposure: Never   Smokeless tobacco: Never  Vaping Use   Vaping status: Never Used  Substance and Sexual Activity   Alcohol use: Yes    Alcohol/week: 10.0 standard drinks of alcohol    Types: 2 Glasses of wine, 8 Shots of liquor per week    Comment: 2 shots sev times/wk   Drug use: No   Sexual activity: Not Currently    Birth control/protection: Abstinence  Other Topics Concern   Not on file  Social History Narrative   Had 3 children but 1 passed away.  No grands   Retired nurse, adult   Social Drivers of Health   Tobacco Use: Low Risk (05/21/2024)   Patient History    Smoking Tobacco Use: Never    Smokeless Tobacco Use: Never    Passive Exposure: Never  Financial Resource Strain: Low Risk (03/19/2024)   Overall Financial Resource Strain (CARDIA)    Difficulty of Paying Living Expenses: Not very hard  Food Insecurity: No Food Insecurity (03/19/2024)   Epic    Worried About Programme Researcher, Broadcasting/film/video in the Last Year: Never true    Ran Out of Food in the Last Year: Never true  Transportation Needs: No Transportation Needs (03/19/2024)   Epic    Lack of Transportation (Medical): No    Lack of Transportation (Non-Medical): No  Physical Activity: Insufficiently Active (03/19/2024)   Exercise Vital Sign    Days of Exercise per Week: 2 days    Minutes of Exercise per Session: 20 min  Stress: No Stress Concern Present (03/19/2024)   [employer] of Occupational Health - Occupational Stress Questionnaire    Feeling of Stress: Not at all  Social Connections: Moderately Integrated (03/19/2024)   Social Connection and Isolation Panel    Frequency of Communication with Friends and Family: Once a week    Frequency of Social Gatherings with Friends and  Family: Once a week    Attends Religious Services: More than 4 times per year    Active Member of [employer] or Organizations: Yes    Attends Banker Meetings: More than  4 times per year    Marital Status: Married  Catering Manager Violence: Not At Risk (10/24/2023)   Humiliation, Afraid, Rape, and Kick questionnaire    Fear of Current or Ex-Partner: No    Emotionally Abused: No    Physically Abused: No    Sexually Abused: No  Depression (PHQ2-9): Low Risk (10/24/2023)   Depression (PHQ2-9)    PHQ-2 Score: 0  Alcohol Screen: Medium Risk (03/19/2024)   Alcohol Screen    Last Alcohol Screening Score (AUDIT): 10  Housing: Low Risk (03/19/2024)   Epic    Unable to Pay for Housing in the Last Year: No    Number of Times Moved in the Last Year: 0    Homeless in the Last Year: No  Utilities: Not At Risk (10/24/2023)   AHC Utilities    Threatened with loss of utilities: No  Health Literacy: Adequate Health Literacy (10/24/2023)   B1300 Health Literacy    Frequency of need for help with medical instructions: Never     Prior to Admission medications   Medication Sig Start Date End Date Taking? Authorizing Provider  CALCIUM PO Take 2 tablets by mouth daily.    [provider]  hydrochlorothiazide  (MICROZIDE ) 12.5 MG capsule TAKE 1 CAPSULE BY MOUTH EVERY DAY 12/18/22   Wendolyn Jenkins Jansky, MD  ketoconazole  (NIZORAL ) 2 % cream Apply 1 Application topically 2 (two) times daily. Patient taking differently: Apply 1 Application topically daily as needed for irritation. 09/27/22   Wendolyn Jenkins Jansky, MD  levothyroxine  (SYNTHROID ) 200 MCG tablet TAKE 1 TABLET BY MOUTH EVERY DAY EVERYDAY EXCEPT MONDAY AND FRIDAY. TAKE 2 TIMES DAILY ON MONDAY AND FRIDAY 12/18/22   Wendolyn Jenkins Jansky, MD  lisinopril  (ZESTRIL ) 10 MG tablet TAKE 1 TABLET BY MOUTH EVERY DAY 12/18/22   Wendolyn Jenkins Jansky, MD  metFORMIN  (GLUCOPHAGE ) 1000 MG tablet Take 1 tablet (1,000 mg total) by mouth daily with breakfast. 03/07/23   Wendolyn Jenkins Jansky, MD  Multiple Vitamins-Minerals (BARIATRIC MULTIVITAMINS) CAPS Take 2 capsules by mouth daily.    [provider]  pravastatin  (PRAVACHOL ) 10 MG tablet TAKE 1 TABLET BY MOUTH EVERY DAY 12/18/22   Wendolyn Jenkins Jansky, MD  rivaroxaban  (XARELTO ) 20 MG TABS tablet Take 1 tablet (20 mg total) by mouth daily with supper. 08/06/23   Patwardhan, Newman PARAS, MD    No Known Allergies  REVIEW OF SYSTEMS:  General: no fevers/chills/night sweats Eyes: no blurry vision, diplopia, or amaurosis ENT: no sore throat or hearing loss Resp: no cough, wheezing, or hemoptysis CV: no edema or palpitations GI: no abdominal pain, nausea, vomiting, diarrhea, or constipation GU: no dysuria, frequency, or hematuria Skin: no rash Neuro: no headache, numbness, tingling, or weakness of extremities Musculoskeletal: no joint pain or swelling Heme: no bleeding, DVT, or easy bruising Endo: no polydipsia or polyuria  BP (!) 108/56 (BP Location: Right Arm, Patient Position: Sitting, Cuff Size: Normal)   Pulse 87   Ht 5' 11 (1.803 m)   Wt (!) 316 lb (143.3 kg)   SpO2 97%   BMI 44.07 kg/m   PHYSICAL EXAM: GEN:  AO x 3 in no acute distress HEENT: normal Dentition: Normal Neck: JVP normal. +2 carotid upstrokes without bruits. No thyromegaly. Lungs: equal expansion, clear bilaterally CV: Apex is discrete and nondisplaced, irregular RR with 3/6 SEM Abd: soft, non-tender, non-distended; no bruit; positive bowel sounds Ext: no edema, ecchymoses, or cyanosis Vascular: 2+ femoral pulses, 2+ radial pulses       Skin:  warm and dry without rash Neuro: CN II-XII grossly intact; motor and sensory grossly intact    DATA AND STUDIES:  EKG: EKG from February 2025 demonstrates atrial fibrillation with occasional PVCs  EKG Interpretation Date/Time:    Ventricular Rate:    PR Interval:    QRS Duration:    QT Interval:    QTC Calculation:   R Axis:      Text Interpretation:          Cardiac Studies &  Procedures   ______________________________________________________________________________________________ CARDIAC CATHETERIZATION  CARDIAC CATHETERIZATION 08/16/2023  Conclusion Coronary angiography 08/16/2023: LM: Normal LAD: Large, no significant disease Lcx: Large, no significant disease RCA: Dominant, no significant disease  LVEDP 28 mmHg  Right heart catheterization 08/16/2023: RA: 15 mmHg RV: 79/0 mmHg PA: 84/34 mmHg, mPAP 53 mmHg PCW: 31 mmHg  AO sats: 96% PA sats: 67%  CO: 7.6 L/min CI: 3.0 L/min/m2  LV-Ao pullback was performed given equivocal findings on echocardiogram Aortic valve mean PG 48 mmHg, AVA 0.94 mm2  Normal coronaries, no significant CAD Severe aortic stenosis Severe pulmonary hypertension. WHO Grp II Elevated filling pressures  Will refer to TAVR clinic  Newman JINNY Lawrence, MD  Findings Coronary Findings Diagnostic  Dominance: Right  Left Main Vessel is large. Vessel is angiographically normal.  Left Anterior Descending Vessel is large. Vessel is angiographically normal.  Left Circumflex Vessel is large. Vessel is angiographically normal.  Right Coronary Artery Vessel is normal in caliber. Vessel is angiographically normal.  Intervention  No interventions have been documented.   STRESS TESTS  MYOCARDIAL PERFUSION IMAGING 09/25/2022  Interpretation Summary   The study is normal. The study is low risk.   No ST deviation was noted.   LV perfusion is normal. There is no evidence of ischemia. There is no evidence of infarction.   Left ventricular function is normal. Nuclear stress EF: 54 %. The left ventricular ejection fraction is mildly decreased (45-54%). End diastolic cavity size is normal. End systolic cavity size is normal.   Prior study available for comparison from 08/26/2018.  Normal stress nuclear study with inferior thinning but no ischemia or infarction; gated EF 54 with normal wall motion.    ECHOCARDIOGRAM  ECHOCARDIOGRAM COMPLETE 04/20/2024  Narrative ECHOCARDIOGRAM REPORT    Patient Name:   DAVIS AMBROSINI Date of Exam: 04/20/2024 Medical Rec #:  [id number]      Height:       71.0 in Accession #:    [id number]     Weight:       319.9 lb Date of Birth:  02/06/1951      BSA:          2.574 m Patient Age:    73 years       BP:           141/95 mmHg Patient Gender: M              HR:           81 bpm. Exam Location:  Outpatient  Procedure: 2D Echo, Color Doppler, Cardiac Doppler and Intracardiac Opacification Agent (Both Spectral and Color Flow Doppler were utilized during procedure).  Indications:     Research Study Patient z00.6 Kardigan/Katalyst  History:         Patient has prior history of Echocardiogram examinations, most recent 03/09/2024. Risk Factors:Hypertension, Sleep Apnea, Diabetes and Dyslipidemia.  Sonographer:     Melissa Morford RDCS (AE, PE) Referring Phys:  [id number] Magaly Pollina K Sherisa Gilvin Diagnosing Phys: Ezra  McleanMD  IMPRESSIONS   1. Left ventricular ejection fraction, by estimation, is 60 to 65%. The left ventricle has normal function. The left ventricle has no regional wall motion abnormalities. There is mild concentric left ventricular hypertrophy of the septal segment. Left ventricular diastolic parameters are indeterminate. 2. Right ventricular systolic function is normal. The right ventricular size is mildly enlarged. Tricuspid regurgitation signal is inadequate for assessing PA pressure. 3. Right atrial size was moderately dilated. 4. The mitral valve is normal in structure. Trivial mitral valve regurgitation. No evidence of mitral stenosis. Moderate mitral annular calcification. 5. The aortic valve is tricuspid. There is severe calcifcation of the aortic valve. Aortic valve regurgitation is mild. Moderate to severe aortic valve stenosis (severe by mean gradient, moderate by calculated valve area). Aortic valve area, by VTI measures 1.23 cm.  Aortic valve mean gradient measures 44.0 mmHg. Dimensionless index 0.27. 6. Aortic dilatation noted. There is mild dilatation of the ascending aorta, measuring 39 mm. 7. The inferior vena cava is normal in size with greater than 50% respiratory variability, suggesting right atrial pressure of 3 mmHg.  FINDINGS Left Ventricle: Left ventricular ejection fraction, by estimation, is 60 to 65%. The left ventricle has normal function. The left ventricle has no regional wall motion abnormalities. Definity  contrast agent was given IV to delineate the left ventricular endocardial borders. The left ventricular internal cavity size was normal in size. There is mild concentric left ventricular hypertrophy of the septal segment. Left ventricular diastolic parameters are indeterminate.  Right Ventricle: The right ventricular size is mildly enlarged. No increase in right ventricular wall thickness. Right ventricular systolic function is normal. Tricuspid regurgitation signal is inadequate for assessing PA pressure.  Left Atrium: Left atrial size was normal in size.  Right Atrium: Right atrial size was moderately dilated.  Pericardium: There is no evidence of pericardial effusion.  Mitral Valve: The mitral valve is normal in structure. There is moderate calcification of the mitral valve leaflet(s). Moderate mitral annular calcification. Trivial mitral valve regurgitation. No evidence of mitral valve stenosis.  Tricuspid Valve: The tricuspid valve is normal in structure. Tricuspid valve regurgitation is not demonstrated.  Aortic Valve: The aortic valve is tricuspid. There is severe calcifcation of the aortic valve. Aortic valve regurgitation is mild. Moderate to severe aortic stenosis is present. Aortic valve mean gradient measures 44.0 mmHg. Aortic valve peak gradient measures 59.4 mmHg. Aortic valve area, by VTI measures 1.23 cm.  Pulmonic Valve: The pulmonic valve was grossly normal. Pulmonic valve  regurgitation is trivial.  Aorta: The aortic root and ascending aorta are structurally normal, with no evidence of dilitation and aortic dilatation noted. Ascending aorta measurements are within normal limits for age when indexed to body surface area. There is mild dilatation of the ascending aorta, measuring 39 mm.  Venous: The inferior vena cava is normal in size with greater than 50% respiratory variability, suggesting right atrial pressure of 3 mmHg.  IAS/Shunts: No atrial level shunt detected by color flow Doppler.   LEFT VENTRICLE PLAX 2D LVIDd:         5.20 cm LVIDs:         3.80 cm LV PW:         1.00 cm LV IVS:        1.40 cm LVOT diam:     2.40 cm LV SV:         94 LV SV Index:   36 LVOT Area:     4.52 cm   RIGHT VENTRICLE  RV S prime:     13.90 cm/s TAPSE (M-mode): 2.0 cm  LEFT ATRIUM             Index        RIGHT ATRIUM           Index LA diam:        4.50 cm 1.75 cm/m   RA Area:     27.90 cm LA Vol (A2C):   78.4 ml 30.45 ml/m  RA Volume:   103.00 ml 40.01 ml/m LA Vol (A4C):   51.3 ml 19.93 ml/m LA Biplane Vol: 66.3 ml 25.75 ml/m AORTIC VALVE AV Area (Vmax):    1.17 cm AV Area (Vmean):   1.18 cm AV Area (VTI):     1.23 cm AV Vmax:           385.40 cm/s AV Vmean:          285.600 cm/s AV VTI:            0.758 m AV Peak Grad:      59.4 mmHg AV Mean Grad:      44.0 mmHg LVOT Vmax:         100.00 cm/s LVOT Vmean:        74.500 cm/s LVOT VTI:          0.207 m LVOT/AV VTI ratio: 0.27  AORTA Ao Root diam: 3.40 cm Ao Asc diam:  3.70 cm   SHUNTS Systemic VTI:  0.21 m Systemic Diam: 2.40 cm  Dalton McleanMD Electronically signed by Ezra Kanner Signature Date/Time: 05/01/2024/2:13:09 PM    Final    MONITORS  CARDIAC EVENT MONITOR 09/03/2023  Narrative Mobile cardiac outpatient telemetry 30 days 09/03/2023 - 10/02/2023: Dominant rhythm: Sinus. 1 patient triggered episode correlated with sinus rhythm and first degree AV block 14 auto  triggered episodes, including 8 episodes with first-degree and second-degree type I AV block (during awake hours).  No associated symptoms reported. 3% PVC burden. No associated symptoms reported. No other arrhythmias noted.   CT SCANS  CT CORONARY MORPH W/CTA COR W/SCORE 04/29/2024  Addendum 05/01/2024  7:50 PM ADDENDUM REPORT: 05/01/2024 19:47  EXAM: OVER-READ INTERPRETATION  CT CHEST  The following report is an over-read performed by radiologist Dr. Suzen Dials of [facility] on 05/01/2024. This over-read does not include interpretation of cardiac or coronary anatomy or pathology. The coronary calcium score/coronary CTA interpretation by the cardiologist is attached.  COMPARISON:  None.  FINDINGS: Cardiovascular: There are no significant extracardiac vascular findings.  Mediastinum/Nodes: There are no enlarged lymph nodes within the visualized mediastinum.  Lungs/Pleura: There is no pleural effusion. An ill-defined, 5 mm ground-glass appearing lung nodule is seen within the right middle lobe (axial CT image 89, CT series 306).  Upper abdomen: No significant findings in the visualized upper abdomen.  Musculoskeletal/Chest wall: No chest wall mass or suspicious osseous findings within the visualized chest.  IMPRESSION: No significant extracardiac findings within the visualized chest.   Electronically Signed By: Suzen Dials M.D. On: 05/01/2024 19:47  Narrative CLINICAL DATA:  Aortic Stenosis.  EXAM: Cardiac/Coronary CTA  TECHNIQUE: A non-contrast, gated CT scan was obtained with axial slices of 2.5 mm through the heart for calcium scoring. Calcium scoring was performed using the Agatston method. A 120 kV prospective, gated, contrast cardiac CT scan was obtained. Gantry rotation speed was 230 msec and collimation was 0.63 mm. Two sublingual nitroglycerin tablets (0.8 mg) were given. The 3D data set was reconstructed with motion  correction for the best systolic or diastolic phase. Images were analyzed on a dedicated workstation using MPR, MIP, and VRT modes. The patient received 95 cc of contrast.  FINDINGS: Image quality: Excellent.  Noise artifact is: Limited.  Aortic valve: Tricuspid aortic valve with severe calcifications and severely restricted leaflet motion in systole. Aortic valve calcium score 3454.  Coronary Arteries: Coronary calcium score of 225. This was 56th percentile for age-, sex, and race-matched controls. Normal coronary origin. Right dominance. The study was performed without use of NTG and is insufficient for plaque evaluation.  Right Atrium: Right atrial size is within normal limits.  Right Ventricle: The right ventricular cavity is within normal limits.  Left Atrium: Left atrial size is normal in size with no left atrial appendage filling defect.  Left Ventricle: The ventricular cavity size is within normal limits.  Pulmonary arteries: Dilated pulmonary artery suggestive of pulmonary hypertension.  Pulmonary veins: Normal pulmonary venous drainage.  Pericardium: Normal thickness without significant effusion or calcium present.  Mitral valve: The mitral valve is normal without significant calcification.  Aorta: Normal caliber without significant disease.  Extra-cardiac findings: See attached radiology report for non-cardiac structures.  IMPRESSION: 1. Tricuspid aortic valve with severe calcifications and severely restricted leaflet motion in systole. Aortic valve calcium score 3454.  2. Coronary calcium score of 225. This was 56th percentile for age-, sex, and race-matched controls.  3. Normal coronary origin with right dominance.  4. The study was performed without use of NTG and is insufficient for plaque evaluation.  5. Dilated pulmonary artery suggestive of pulmonary hypertension.  Darryle Decent, MD  Electronically Signed: By: Darryle Decent M.D. On:  04/30/2024 12:44     ______________________________________________________________________________________________      03/23/2024: ALT 15; BUN 23; Creatinine, Ser 1.11; Hemoglobin 10.9; Platelets 197.0; Potassium 4.2; Sodium 134; TSH 2.41   STS RISK CALCULATOR: Pending  NHYA CLASS: 2    ASSESSMENT AND PLAN:   1. Nonrheumatic aortic valve stenosis   2. Type 2 diabetes mellitus with complication, without long-term current use of insulin  (HCC)   3. Hypertension associated with diabetes (HCC)   4. Hyperlipidemia associated with type 2 diabetes mellitus (HCC)   5. CKD stage 3 secondary to diabetes (HCC)   6. PAF (paroxysmal atrial fibrillation) (HCC)   7. Secondary hypercoagulable state   8. BMI 40.0-44.9, adult [facility])       Aortic stenosis: Most recent echocardiogram demonstrated progression to severe aortic stenosis.  He endorses NYHA class II symptoms of shortness of breath and he had an episode of orthopnea.  He had coronary angiography done in March which demonstrated no obstructive coronary artery disease.  I will refer him for TAVR protocol CTA and cardiothoracic surgical opinion.  He did have a CT scan for the catalyst trial.  We will see if maybe this can be used to assess for the feasibility of TAVR.  If this is the case he would still need a peripheral runoff study.  He does need dental work and he tells me he will get this done in the next few weeks. T2DM: Continue Xarelto  20 mg daily instead of aspirin , pravastatin  10 mg daily, lisinopril  10 mg daily, Jardiance  10 mg Hypertension: Continue lisinopril  10 mg daily and hydrochlorothiazide  to 25.  BP well-controlled today Hyperlipidemia: Continue pravastatin  10 mg daily; followed by patient's primary cardiologist. CKD stage III: Continue lisinopril  10 mg daily and Jardiance  10 mg. PAF: Continue Xarelto  20 mg daily.  Secondary hypercoagulable state: Continue Xarelto  20 mg  daily. Elevated BMI: Continue Mounjaro  5 mg q.  Weekly  I spent 38 minutes reviewing all clinical data during and prior to this visit including all relevant imaging studies, laboratories, clinical information from other health systems and prior notes from both Cardiology and other specialties, interviewing the patient, conducting a complete physical examination, and coordinating care in order to formulate a comprehensive and personalized evaluation and treatment plan.   I have personally reviewed the patients imaging data as summarized above.  I have reviewed the natural history of aortic stenosis with the patient and family members who are present today. We have discussed the limitations of medical therapy and the poor prognosis associated with symptomatic aortic stenosis. We have also reviewed potential treatment options, including palliative medical therapy, conventional surgical aortic valve replacement, and transcatheter aortic valve replacement. We discussed treatment options in the context of this patient's specific comorbid medical conditions.   All of the patient's questions were answered today. Will make further recommendations based on the results of studies outlined above.    Sevana Grandinetti K Bibiana Gillean, MD  05/21/2024 2:25 PM    [facility] [address] [place], [place], [place]  [address] Phone: [phone]; Fax: [phone]

## 2024-05-21 ENCOUNTER — Encounter: Payer: Self-pay | Admitting: Internal Medicine

## 2024-05-21 ENCOUNTER — Ambulatory Visit: Attending: Internal Medicine | Admitting: Internal Medicine

## 2024-05-21 VITALS — BP 108/56 | HR 87 | Ht 71.0 in | Wt 316.0 lb

## 2024-05-21 DIAGNOSIS — E785 Hyperlipidemia, unspecified: Secondary | ICD-10-CM | POA: Diagnosis not present

## 2024-05-21 DIAGNOSIS — Z6841 Body Mass Index (BMI) 40.0 and over, adult: Secondary | ICD-10-CM | POA: Diagnosis not present

## 2024-05-21 DIAGNOSIS — E1159 Type 2 diabetes mellitus with other circulatory complications: Secondary | ICD-10-CM | POA: Diagnosis not present

## 2024-05-21 DIAGNOSIS — I152 Hypertension secondary to endocrine disorders: Secondary | ICD-10-CM | POA: Diagnosis not present

## 2024-05-21 DIAGNOSIS — E1122 Type 2 diabetes mellitus with diabetic chronic kidney disease: Secondary | ICD-10-CM | POA: Diagnosis not present

## 2024-05-21 DIAGNOSIS — I350 Nonrheumatic aortic (valve) stenosis: Secondary | ICD-10-CM

## 2024-05-21 DIAGNOSIS — N183 Chronic kidney disease, stage 3 unspecified: Secondary | ICD-10-CM | POA: Diagnosis not present

## 2024-05-21 DIAGNOSIS — I480 Paroxysmal atrial fibrillation: Secondary | ICD-10-CM

## 2024-05-21 DIAGNOSIS — D6869 Other thrombophilia: Secondary | ICD-10-CM

## 2024-05-21 DIAGNOSIS — E1169 Type 2 diabetes mellitus with other specified complication: Secondary | ICD-10-CM | POA: Diagnosis not present

## 2024-05-21 DIAGNOSIS — E118 Type 2 diabetes mellitus with unspecified complications: Secondary | ICD-10-CM | POA: Diagnosis not present

## 2024-05-21 LAB — BASIC METABOLIC PANEL WITH GFR
BUN/Creatinine Ratio: 18 (ref 10–24)
BUN: 23 mg/dL (ref 8–27)
CO2: 19 mmol/L — ABNORMAL LOW (ref 20–29)
Calcium: 9.8 mg/dL (ref 8.6–10.2)
Chloride: 99 mmol/L (ref 96–106)
Creatinine, Ser: 1.31 mg/dL — ABNORMAL HIGH (ref 0.76–1.27)
Glucose: 107 mg/dL — ABNORMAL HIGH (ref 70–99)
Potassium: 4.8 mmol/L (ref 3.5–5.2)
Sodium: 134 mmol/L (ref 134–144)
eGFR: 58 mL/min/1.73 — ABNORMAL LOW (ref >59)

## 2024-05-21 NOTE — Progress Notes (Signed)
 Pre Surgical Assessment: 5 M Walk Test  59M=16.73ft  5 Meter Walk Test- trial 1: 6.39 seconds 5 Meter Walk Test- trial 2: 5.98 seconds 5 Meter Walk Test- trial 3: 6.28 seconds 5 Meter Walk Test Average: 6.22 seconds

## 2024-05-21 NOTE — Patient Instructions (Addendum)
 Medication Instructions:  Your physician recommends that you continue on your current medications as directed. Please refer to the Current Medication list given to you today.  *If you need a refill on your cardiac medications before your next appointment, please call your pharmacy*  Lab Work: NONE  If you have labs (blood work) drawn today and your tests are completely normal, you will receive your results only by: MyChart Message (if you have MyChart) OR A paper copy in the mail If you have any lab test that is abnormal or we need to change your treatment, we will call you to review the results.  Testing/Procedures: Our Structural Team will reach out to you to set up CT Scans.   Follow-Up:To be determined.  At [facility], you and your health needs are our priority.  As part of our continuing mission to provide you with exceptional heart care, our providers are all part of one team.  This team includes your primary Cardiologist (physician) and Advanced Practice Providers or APPs (Physician Assistants and Nurse Practitioners) who all work together to provide you with the care you need, when you need it.   Provider:   Lurena Red, MD

## 2024-05-22 ENCOUNTER — Other Ambulatory Visit: Payer: Self-pay

## 2024-05-22 ENCOUNTER — Ambulatory Visit: Payer: Self-pay | Admitting: Internal Medicine

## 2024-05-22 DIAGNOSIS — I350 Nonrheumatic aortic (valve) stenosis: Secondary | ICD-10-CM

## 2024-05-22 MED ORDER — METOPROLOL TARTRATE 50 MG PO TABS: 50.0000 mg | ORAL_TABLET | Freq: Once | ORAL | 0 refills | Status: AC

## 2024-06-02 ENCOUNTER — Encounter: Payer: Self-pay | Admitting: Oncology

## 2024-06-02 ENCOUNTER — Inpatient Hospital Stay: Attending: Oncology | Admitting: Oncology

## 2024-06-02 ENCOUNTER — Telehealth: Payer: Self-pay | Admitting: Oncology

## 2024-06-02 ENCOUNTER — Inpatient Hospital Stay

## 2024-06-02 VITALS — BP 132/78 | HR 92 | Temp 97.2°F | Resp 18 | Ht 71.0 in | Wt 312.7 lb

## 2024-06-02 DIAGNOSIS — D508 Other iron deficiency anemias: Secondary | ICD-10-CM | POA: Insufficient documentation

## 2024-06-02 DIAGNOSIS — Z9884 Bariatric surgery status: Secondary | ICD-10-CM | POA: Insufficient documentation

## 2024-06-02 DIAGNOSIS — F1090 Alcohol use, unspecified, uncomplicated: Secondary | ICD-10-CM | POA: Diagnosis not present

## 2024-06-02 DIAGNOSIS — K909 Intestinal malabsorption, unspecified: Secondary | ICD-10-CM | POA: Diagnosis not present

## 2024-06-02 DIAGNOSIS — Z7901 Long term (current) use of anticoagulants: Secondary | ICD-10-CM | POA: Insufficient documentation

## 2024-06-02 DIAGNOSIS — Z8546 Personal history of malignant neoplasm of prostate: Secondary | ICD-10-CM | POA: Diagnosis not present

## 2024-06-02 DIAGNOSIS — Z803 Family history of malignant neoplasm of breast: Secondary | ICD-10-CM | POA: Diagnosis not present

## 2024-06-02 DIAGNOSIS — I4891 Unspecified atrial fibrillation: Secondary | ICD-10-CM | POA: Insufficient documentation

## 2024-06-02 DIAGNOSIS — D509 Iron deficiency anemia, unspecified: Secondary | ICD-10-CM

## 2024-06-02 LAB — CMP (CANCER CENTER ONLY)
ALT: 16 U/L (ref 0–44)
AST: 21 U/L (ref 15–41)
Albumin: 4.3 g/dL (ref 3.5–5.0)
Alkaline Phosphatase: 83 U/L (ref 38–126)
Anion gap: 12 (ref 5–15)
BUN: 24 mg/dL — ABNORMAL HIGH (ref 8–23)
CO2: 24 mmol/L (ref 22–32)
Calcium: 10.7 mg/dL — ABNORMAL HIGH (ref 8.9–10.3)
Chloride: 99 mmol/L (ref 98–111)
Creatinine: 1.16 mg/dL (ref 0.61–1.24)
GFR, Estimated: >60 mL/min
Glucose, Bld: 111 mg/dL — ABNORMAL HIGH (ref 70–99)
Potassium: 4.9 mmol/L (ref 3.5–5.1)
Sodium: 135 mmol/L (ref 135–145)
Total Bilirubin: 0.5 mg/dL (ref 0.0–1.2)
Total Protein: 8.3 g/dL — ABNORMAL HIGH (ref 6.5–8.1)

## 2024-06-02 LAB — FERRITIN: Ferritin: 35 ng/mL (ref 24–336)

## 2024-06-02 LAB — CBC WITH DIFFERENTIAL (CANCER CENTER ONLY)
Abs Immature Granulocytes: 0.02 K/uL (ref 0.00–0.07)
Basophils Absolute: 0.1 K/uL (ref 0.0–0.1)
Basophils Relative: 1 %
Eosinophils Absolute: 0.2 K/uL (ref 0.0–0.5)
Eosinophils Relative: 3 %
HCT: 39.8 % (ref 39.0–52.0)
Hemoglobin: 11.8 g/dL — ABNORMAL LOW (ref 13.0–17.0)
Immature Granulocytes: 0 %
Lymphocytes Relative: 12 %
Lymphs Abs: 0.9 K/uL (ref 0.7–4.0)
MCH: 23.2 pg — ABNORMAL LOW (ref 26.0–34.0)
MCHC: 29.6 g/dL — ABNORMAL LOW (ref 30.0–36.0)
MCV: 78.3 fL — ABNORMAL LOW (ref 80.0–100.0)
Monocytes Absolute: 0.6 K/uL (ref 0.1–1.0)
Monocytes Relative: 9 %
Neutro Abs: 5.2 K/uL (ref 1.7–7.7)
Neutrophils Relative %: 75 %
Platelet Count: 208 K/uL (ref 150–400)
RBC: 5.08 MIL/uL (ref 4.22–5.81)
RDW: 18.1 % — ABNORMAL HIGH (ref 11.5–15.5)
WBC Count: 7 K/uL (ref 4.0–10.5)
nRBC: 0 % (ref 0.0–0.2)

## 2024-06-02 LAB — IRON AND TIBC
Iron: 31 ug/dL — ABNORMAL LOW (ref 45–182)
Saturation Ratios: 6 % — ABNORMAL LOW (ref 17.9–39.5)
TIBC: 532 ug/dL — ABNORMAL HIGH (ref 250–450)
UIBC: 501 ug/dL

## 2024-06-02 LAB — LACTATE DEHYDROGENASE: LDH: 225 U/L (ref 105–235)

## 2024-06-02 LAB — VITAMIN B12: Vitamin B-12: 872 pg/mL (ref 180–914)

## 2024-06-02 LAB — FOLATE: Folate: >20 ng/mL

## 2024-06-02 NOTE — Progress Notes (Signed)
 "  [facility]  HEMATOLOGY CLINIC CONSULTATION NOTE   PATIENT NAME: [facility]   MR#: [id number] DOB: September 13, 1950  DATE OF SERVICE: 06/02/2024  Patient Care Team: Wendolyn Jenkins Jansky, MD as PCP - General (Family Medicine) Elmira Newman PARAS, MD as PCP - Cardiology (Cardiology) Grayce Buddle, RN Nurse Navigator as Registered Nurse (Medical Oncology) Elmira Newman PARAS, MD as Consulting Physician (Cardiology)  REASON FOR CONSULTATION/ CHIEF COMPLAINT:  Evaluation of anemia.  ASSESSMENT & PLAN:   [facility] is a 73 y.o. gentleman with a past medical history of hypertension, type 2 diabetes mellitus, dyslipidemia, hx of gastric bypass, aortic valve stenosis, OSA on CPAP, hypothyroidism, paroxysmal atrial fibrillation, remote history of prostate cancer treated with radioactive seeds, was referred to our service for evaluation of iron deficiency anemia.    Iron deficiency anemia Chronic iron deficiency anemia has persisted since gastric bypass in 2013, with hemoglobin consistently below normal (most recently 10.9 g/dL). Iron studies in October 2025 confirm deficiency (ferritin 12 ng/mL, iron saturation 7%).  He has not required transfusions or prior iron infusions.  Etiology is likely chronic malabsorption post-bypass, with possible minor contribution from aortic stenosis and less likely occult gastrointestinal blood loss, as colonoscopies have been unremarkable and there are no overt bleeding symptoms.   Xarelto  may increase risk of microscopic blood loss, but anemia predates its use.   Additional causes such as vitamin B12/folate deficiency and hemolysis will be evaluated.  Labs today showed persistent anemia with hemoglobin of 11.8, MCV 78.  White count and platelet count are within normal limits.  Iron studies show evidence of iron deficiency with iron saturation of 6%, increased iron binding capacity.  Vitamin B12 and folate are within normal limits.  LDH normal.   Haptoglobin pending.  Will proceed with IV iron using Feraheme weekly x 2 doses, 1 week apart.  Our clinic will arrange infusion appointments and inform patient.  - Planned follow-up in four months with repeat labs to assess response to iron infusion. - Discussed possible need for repeat iron infusions if anemia recurs or persists. - Further gastrointestinal workup (e.g., endoscopy) may be considered if new symptoms or unexplained blood loss arise.  Since the cause of anemia seems to be obvious from iron deficiency, I am not pursuing extensive workup at this time.  If inadequate response to iron replacement is noted, we will pursue workup to rule out other etiologies.  I reviewed lab results and outside records for this visit and discussed relevant results with the patient. Diagnosis, plan of care and treatment options were also discussed in detail with the patient. Opportunity provided to ask questions and answers provided to his apparent satisfaction. Provided instructions to call our clinic with any problems, questions or concerns prior to return visit. I recommended to continue follow-up with PCP and sub-specialists. He verbalized understanding and agreed with the plan. No barriers to learning was detected.  [name], MD [facility] - A DEPT OF [facility] [address] [name] Dept: [phone] Dept Fax: [phone]  06/02/2024 4:00 PM  HISTORY OF PRESENT ILLNESS:  Discussed the use of AI scribe software for clinical note transcription with the patient, who gave verbal consent to proceed.  History of Present Illness [facility] is a 73 year old male with chronic iron deficiency anemia secondary to malabsorption following Roux-en-Y gastric bypass who presents for evaluation of persistent anemia.  On 03/23/2024, labs at  his PCPs office showed hemoglobin of 10.9, hematocrit 25.8, MCV 78.   White count and platelet count were within normal limits.  Iron studies showed evidence of iron deficiency with iron saturation of 7%, ferritin of 12, iron decreased at 39 and increased iron binding capacity.  Vitamin B12 was normal.  TSH was normal.  He was referred to us  for further evaluation of iron deficiency anemia.  He has had persistent anemia since his gastric bypass in 2013, with hemoglobin values consistently in the 11-12 g/dL range and most recently 10.9 g/dL. Iron studies from October showed ferritin of 12 ng/mL and iron saturation of 7%, confirming iron deficiency. He has not required blood transfusions or intravenous iron therapy. He takes bariatric multivitamins containing iron but has not used separate oral iron supplements.  He denies overt blood loss, including hematochezia, melena, epistaxis, or gingival bleeding. He has not noted abnormal bruising or new masses. Colonoscopies in 2013 and 2020-2021 were unremarkable. He denies gastrointestinal symptoms suggestive of ulceration and is aware of the risk of ulcers in the excluded stomach segment post-bypass but has not experienced symptoms.  He is currently on Xarelto  for atrial fibrillation. His anemia was present before he started anticoagulation. He is also being evaluated for aortic valve replacement due to aortic stenosis. He started Mounjaro  less than two months ago for diabetes and weight loss.      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 06/09/2012   Morbid obesity with BMI of 55.8 06/05/2012   Neuromuscular disorder (HCC)    Neuropathy   OSA on CPAP 02/06/2012   Prostate cancer (HCC)     radioactive seeds    SURGICAL HISTORY: Past Surgical History:  Procedure Laterality Date   BREATH TEK H PYLORI  04/22/2012   Procedure: BREATH TEK H PYLORI;  Surgeon: Donnice KATHEE Lunger, MD;  Location: [facility];  Service: General;  Laterality: N/A;   CARDIAC CATHETERIZATION     GASTRIC ROUX-EN-Y  06/09/2012   Procedure: LAPAROSCOPIC ROUX-EN-Y GASTRIC;  Surgeon: Donnice KATHEE Lunger, MD;  Location: [facility];  Service: General;  Laterality: N/A;   HERNIA REPAIR  1992   umbilical   PROSTATE BIOPSY     RADIOACTIVE SEED IMPLANT N/A 12/09/2018   Procedure: RADIOACTIVE SEED IMPLANT/BRACHYTHERAPY IMPLANT;  Surgeon: Watt Rush, MD;  Location: [facility];  Service: Urology;  Laterality: N/A;   right knee arthroscopy  1999   RIGHT/LEFT HEART CATH AND CORONARY ANGIOGRAPHY N/A 08/16/2023   Procedure: RIGHT/LEFT HEART CATH AND CORONARY ANGIOGRAPHY;  Surgeon: Elmira Newman PARAS, MD;  Location: [facility];  Service: Cardiovascular;  Laterality: N/A;   SPACE OAR INSTILLATION N/A 12/09/2018   Procedure: SPACE OAR INSTILLATION;  Surgeon: Watt Rush, MD;  Location: [facility];  Service: Urology;  Laterality: N/A;   TOTAL KNEE ARTHROPLASTY Right 12/18/2022   Procedure: TOTAL KNEE ARTHROPLASTY;  Surgeon: Josefina Chew, MD;  Location: [facility];  Service: Orthopedics;  Laterality: Right;   TOTAL KNEE ARTHROPLASTY Left 04/23/2023   Procedure: LEFT TOTAL KNEE ARTHROPLASTY;  Surgeon: Josefina Chew, MD;  Location: [facility];  Service: Orthopedics;  Laterality: Left;  VENTRAL HERNIA REPAIR  06/09/2012   Procedure: LAPAROSCOPIC VENTRAL HERNIA;  Surgeon: Donnice KATHEE Lunger, MD;  Location: [facility];  Service: General;;  primary repair of ventral hernia    SOCIAL HISTORY: He reports that he has never smoked. He has never been exposed to tobacco smoke. He has never used smokeless tobacco. He reports current alcohol use of about 10.0 standard drinks of alcohol per week. He reports that he does not use drugs. Social History    Socioeconomic History   Marital status: Married    Spouse name: Not on file   Number of children: 3   Years of education: Not on file   Highest education level: Doctorate  Occupational History    Comment: working full time  Tobacco Use   Smoking status: Never    Passive exposure: Never   Smokeless tobacco: Never  Vaping Use   Vaping status: Never Used  Substance and Sexual Activity   Alcohol use: Yes    Alcohol/week: 10.0 standard drinks of alcohol    Types: 2 Glasses of wine, 8 Shots of liquor per week    Comment: 2 shots sev times/wk   Drug use: No   Sexual activity: Not Currently    Birth control/protection: Abstinence  Other Topics Concern   Not on file  Social History Narrative   Had 3 children but 1 passed away.  No grands   Retired nurse, adult   Social Drivers of Health   Tobacco Use: Low Risk (05/21/2024)   Patient History    Smoking Tobacco Use: Never    Smokeless Tobacco Use: Never    Passive Exposure: Never  Financial Resource Strain: Low Risk (03/19/2024)   Overall Financial Resource Strain (CARDIA)    Difficulty of Paying Living Expenses: Not very hard  Food Insecurity: No Food Insecurity (06/02/2024)   Epic    Worried About Programme Researcher, Broadcasting/film/video in the Last Year: Never true    Ran Out of Food in the Last Year: Never true  Transportation Needs: No Transportation Needs (06/02/2024)   Epic    Lack of Transportation (Medical): No    Lack of Transportation (Non-Medical): No  Physical Activity: Insufficiently Active (03/19/2024)   Exercise Vital Sign    Days of Exercise per Week: 2 days    Minutes of Exercise per Session: 20 min  Stress: No Stress Concern Present (03/19/2024)   [employer] of Occupational Health - Occupational Stress Questionnaire    Feeling of Stress: Not at all  Social Connections: Moderately Integrated (03/19/2024)   Social Connection and Isolation Panel    Frequency of Communication with Friends and Family: Once a week     Frequency of Social Gatherings with Friends and Family: Once a week    Attends Religious Services: More than 4 times per year    Active Member of Clubs or Organizations: Yes    Attends Banker Meetings: More than 4 times per year    Marital Status: Married  Catering Manager Violence: Not At Risk (06/02/2024)   Epic    Fear of Current or Ex-Partner: No    Emotionally Abused: No    Physically Abused: No    Sexually Abused: No  Depression (PHQ2-9): Low Risk (06/02/2024)   Depression (PHQ2-9)    PHQ-2 Score: 0  Alcohol Screen: Medium Risk (03/19/2024)   Alcohol Screen    Last Alcohol Screening Score (AUDIT): 10  Housing: Low Risk (06/02/2024)   Epic    Unable to Pay for Housing  in the Last Year: No    Number of Times Moved in the Last Year: 0    Homeless in the Last Year: No  Utilities: Not At Risk (06/02/2024)   Epic    Threatened with loss of utilities: No  Health Literacy: Adequate Health Literacy (10/24/2023)   B1300 Health Literacy    Frequency of need for help with medical instructions: Never    FAMILY HISTORY: Family History  Problem Relation Age of Onset   Breast cancer Mother    Vision loss Mother    Varicose Veins Mother    Heart disease Father        rhuematic heart disease   Diabetes Father    Hypertension Father    Obesity Father    Varicose Veins Father    Diabetes Brother        2 brothers   Hypertension Brother    Obesity Brother    Diabetes Maternal Grandmother    Obesity Brother    Stroke Brother    Prostate cancer Neg Hx    Colon cancer Neg Hx    Pancreatic cancer Neg Hx     ALLERGIES:  He has no known allergies.  MEDICATIONS:  Current Outpatient Medications  Medication Sig Dispense Refill   CALCIUM PO Take 2 tablets by mouth daily.     diclofenac  Sodium (VOLTAREN ) 1 % GEL Apply 2 g topically 4 (four) times daily. 300 g 3   empagliflozin  (JARDIANCE ) 10 MG TABS tablet Take 1 tablet (10 mg total) by mouth daily before breakfast.  90 tablet 1   hydrochlorothiazide  (HYDRODIURIL ) 25 MG tablet Take 1 tablet (25 mg total) by mouth daily. 90 tablet 3   levothyroxine  (SYNTHROID ) 200 MCG tablet 1 tab po daily but 2 tabs on Monday and Friday 125 tablet 1   lisinopril  (ZESTRIL ) 10 MG tablet Take 1 tablet (10 mg total) by mouth daily. 90 tablet 1   metFORMIN  (GLUCOPHAGE ) 1000 MG tablet Take 1 tablet (1,000 mg total) by mouth daily with breakfast. 90 tablet 1   metoprolol  tartrate (LOPRESSOR ) 50 MG tablet Take 1 tablet (50 mg total) by mouth once. Take 1 tablet by mouth 2 hours prior to CT scan. 1 tablet 0   Multiple Vitamins-Minerals (BARIATRIC MULTIVITAMINS) CAPS Take 2 capsules by mouth daily.     pravastatin  (PRAVACHOL ) 10 MG tablet Take 1 tablet (10 mg total) by mouth daily. 90 tablet 1   rivaroxaban  (XARELTO ) 20 MG TABS tablet Take 1 tablet (20 mg total) by mouth daily with supper. 30 tablet 6   tirzepatide  (MOUNJARO ) 5 MG/0.5ML Pen Inject 5 mg into the skin once a week. 2 mL 0   No current facility-administered medications for this visit.    REVIEW OF SYSTEMS:    Review of Systems - Oncology  All other pertinent systems were reviewed and were negative except as mentioned above.  PHYSICAL EXAMINATION:    Onc Performance Status - 06/02/24 1001       KPS SCALE   KPS % SCORE Normal activity with effort, some s/s of disease          Vitals:   06/02/24 0958  BP: 132/78  Pulse: 92  Resp: 18  Temp: (!) 97.2 F (36.2 C)  SpO2: 99%   Filed Weights   06/02/24 0958  Weight: (!) 312 lb 11.2 oz (141.8 kg)    Physical Exam Constitutional:      General: He is not in acute distress.    Appearance: Normal appearance.  HENT:     Head: Normocephalic and atraumatic.  Eyes:     Conjunctiva/sclera: Conjunctivae normal.  Cardiovascular:     Rate and Rhythm: Normal rate and regular rhythm.     Heart sounds: Murmur heard.  Pulmonary:     Effort: Pulmonary effort is normal. No respiratory distress.  Abdominal:      General: There is no distension.  Neurological:     General: No focal deficit present.     Mental Status: He is alert and oriented to person, place, and time.  Psychiatric:        Mood and Affect: Mood normal.        Behavior: Behavior normal.     LABORATORY DATA:   I have reviewed the data as listed.  Results for orders placed or performed in visit on 06/02/24  Lactate dehydrogenase  Result Value Ref Range   LDH 225 105 - 235 U/L  Folate  Result Value Ref Range   Folate >20.0 >5.9 ng/mL  Vitamin B12  Result Value Ref Range   Vitamin B-12 872 180 - 914 pg/mL  Ferritin  Result Value Ref Range   Ferritin 35 24 - 336 ng/mL  Iron and TIBC  Result Value Ref Range   Iron 31 (L) 45 - 182 ug/dL   TIBC 467 (H) 749 - 549 ug/dL   Saturation Ratios 6 (L) 17.9 - 39.5 %   UIBC 501 ug/dL  CMP (Cancer Center only)  Result Value Ref Range   Sodium 135 135 - 145 mmol/L   Potassium 4.9 3.5 - 5.1 mmol/L   Chloride 99 98 - 111 mmol/L   CO2 24 22 - 32 mmol/L   Glucose, Bld 111 (H) 70 - 99 mg/dL   BUN 24 (H) 8 - 23 mg/dL   Creatinine 8.83 9.38 - 1.24 mg/dL   Calcium 89.2 (H) 8.9 - 10.3 mg/dL   Total Protein 8.3 (H) 6.5 - 8.1 g/dL   Albumin 4.3 3.5 - 5.0 g/dL   AST 21 15 - 41 U/L   ALT 16 0 - 44 U/L   Alkaline Phosphatase 83 38 - 126 U/L   Total Bilirubin 0.5 0.0 - 1.2 mg/dL   GFR, Estimated >39 >39 mL/min   Anion gap 12 5 - 15  CBC with Differential (Cancer Center Only)  Result Value Ref Range   WBC Count 7.0 4.0 - 10.5 K/uL   RBC 5.08 4.22 - 5.81 MIL/uL   Hemoglobin 11.8 (L) 13.0 - 17.0 g/dL   HCT 60.1 60.9 - 47.9 %   MCV 78.3 (L) 80.0 - 100.0 fL   MCH 23.2 (L) 26.0 - 34.0 pg   MCHC 29.6 (L) 30.0 - 36.0 g/dL   RDW 81.8 (H) 88.4 - 84.4 %   Platelet Count 208 150 - 400 K/uL   nRBC 0.0 0.0 - 0.2 %   Neutrophils Relative % 75 %   Neutro Abs 5.2 1.7 - 7.7 K/uL   Lymphocytes Relative 12 %   Lymphs Abs 0.9 0.7 - 4.0 K/uL   Monocytes Relative 9 %   Monocytes Absolute 0.6 0.1 -  1.0 K/uL   Eosinophils Relative 3 %   Eosinophils Absolute 0.2 0.0 - 0.5 K/uL   Basophils Relative 1 %   Basophils Absolute 0.1 0.0 - 0.1 K/uL   Immature Granulocytes 0 %   Abs Immature Granulocytes 0.02 0.00 - 0.07 K/uL     RADIOGRAPHIC STUDIES:  No recent pertinent imaging studies available to review.  Orders  Placed This Encounter  Procedures   CBC with Differential (Cancer Center Only)    Standing Status:   Future    Number of Occurrences:   1    Expiration Date:   06/02/2025   CMP (Cancer Center only)    Standing Status:   Future    Number of Occurrences:   1    Expiration Date:   06/02/2025   Iron and TIBC    Standing Status:   Future    Number of Occurrences:   1    Expiration Date:   06/02/2025   Ferritin    Standing Status:   Future    Number of Occurrences:   1    Expiration Date:   06/02/2025   Vitamin B12    Standing Status:   Future    Number of Occurrences:   1    Expiration Date:   06/02/2025   Folate    Standing Status:   Future    Number of Occurrences:   1    Expiration Date:   06/02/2025   Lactate dehydrogenase    Standing Status:   Future    Number of Occurrences:   1    Expiration Date:   06/02/2025   Haptoglobin    Standing Status:   Future    Number of Occurrences:   1    Expiration Date:   06/02/2025    Future Appointments  Date Time Provider Department Center  06/19/2024 10:30 AM HVC CT 1 [facility] H&V  06/19/2024  2:00 PM [facility] INFUSION [facility] None  06/26/2024  2:00 PM [facility] INFUSION [facility] None  07/03/2024 10:00 AM Shyrl Linnie KIDD, MD [facility] H&V  09/11/2024  9:15 AM [facility] 3 [facility]-ECHO H&V  09/22/2024 10:00 AM Wendolyn Jenkins Jansky, MD [facility]  10/29/2024  9:20 AM [facility] ANNUAL WELLNESS VISIT 1 [facility] [place]     I spent a total of 55 minutes during this encounter with the patient including review of chart and various tests results, discussions about plan of care and coordination of care plan.  This  document was completed utilizing speech recognition software. Grammatical errors, random word insertions, pronoun errors, and incomplete sentences are an occasional consequence of this system due to software limitations, ambient noise, and hardware issues. Any formal questions or concerns about the content, text or information contained within the body of this dictation should be directly addressed to the provider for clarification.  "

## 2024-06-02 NOTE — Telephone Encounter (Signed)
 Left detailed message on PT voicemail about scheduled iron infusions.

## 2024-06-02 NOTE — Assessment & Plan Note (Addendum)
 Chronic iron deficiency anemia has persisted since gastric bypass in 2013, with hemoglobin consistently below normal (most recently 10.9 g/dL). Iron studies in October 2025 confirm deficiency (ferritin 12 ng/mL, iron saturation 7%).  He has not required transfusions or prior iron infusions.  Etiology is likely chronic malabsorption post-bypass, with possible minor contribution from aortic stenosis and less likely occult gastrointestinal blood loss, as colonoscopies have been unremarkable and there are no overt bleeding symptoms.   Xarelto  may increase risk of microscopic blood loss, but anemia predates its use.   Additional causes such as vitamin B12/folate deficiency and hemolysis will be evaluated.  Labs today showed persistent anemia with hemoglobin of 11.8, MCV 78.  White count and platelet count are within normal limits.  Iron studies show evidence of iron deficiency with iron saturation of 6%, increased iron binding capacity.  Vitamin B12 and folate are within normal limits.  LDH normal.  Haptoglobin pending.  Will proceed with IV iron using Feraheme weekly x 2 doses, 1 week apart.  Our clinic will arrange infusion appointments and inform patient.  - Planned follow-up in four months with repeat labs to assess response to iron infusion. - Discussed possible need for repeat iron infusions if anemia recurs or persists. - Further gastrointestinal workup (e.g., endoscopy) may be considered if new symptoms or unexplained blood loss arise.

## 2024-06-03 LAB — HAPTOGLOBIN: Haptoglobin: 208 mg/dL (ref 34–355)

## 2024-06-05 ENCOUNTER — Other Ambulatory Visit (HOSPITAL_COMMUNITY): Payer: Self-pay

## 2024-06-05 ENCOUNTER — Encounter: Payer: Self-pay | Admitting: Pharmacist

## 2024-06-05 MED ORDER — MOUNJARO 7.5 MG/0.5ML ~~LOC~~ SOAJ
7.5000 mg | SUBCUTANEOUS | 0 refills | Status: DC
  Filled 2024-06-05: qty 2, 28d supply, fill #0

## 2024-06-12 ENCOUNTER — Telehealth: Payer: Self-pay | Admitting: Family Medicine

## 2024-06-12 ENCOUNTER — Telehealth: Payer: Self-pay

## 2024-06-12 NOTE — Telephone Encounter (Signed)
 LVM that pt's PCP will not be in office on 09/23/23 and we need to get him rescheduled

## 2024-06-12 NOTE — Telephone Encounter (Signed)
 Spoke with patient and confirmed new appointment time on Friday 06/19/2024 for 1200, patient verbalized agreement.

## 2024-06-17 NOTE — Progress Notes (Signed)
 Procedure Type: Isolated AVR Perioperative Outcome Estimate % Operative Mortality 2.17% Morbidity & Mortality 11.9% Stroke 0.764% Renal Failure 4.27% Reoperation 3.42% Prolonged Ventilation 6.49% Deep Sternal Wound Infection 0.161% Long Hospital Stay (>14 days) 7.21% Short Hospital Stay (<6 days)* 36.2%

## 2024-06-19 ENCOUNTER — Encounter: Payer: Self-pay | Admitting: Physician Assistant

## 2024-06-19 ENCOUNTER — Ambulatory Visit: Payer: Self-pay | Admitting: Internal Medicine

## 2024-06-19 ENCOUNTER — Ambulatory Visit (HOSPITAL_COMMUNITY)
Admission: RE | Admit: 2024-06-19 | Discharge: 2024-06-19 | Disposition: A | Source: Ambulatory Visit | Attending: Cardiology | Admitting: Cardiology

## 2024-06-19 ENCOUNTER — Inpatient Hospital Stay: Attending: Oncology

## 2024-06-19 VITALS — BP 97/66 | HR 67 | Temp 97.9°F | Resp 16

## 2024-06-19 DIAGNOSIS — D509 Iron deficiency anemia, unspecified: Secondary | ICD-10-CM

## 2024-06-19 DIAGNOSIS — D508 Other iron deficiency anemias: Secondary | ICD-10-CM | POA: Diagnosis present

## 2024-06-19 DIAGNOSIS — Z9884 Bariatric surgery status: Secondary | ICD-10-CM | POA: Diagnosis present

## 2024-06-19 DIAGNOSIS — I350 Nonrheumatic aortic (valve) stenosis: Secondary | ICD-10-CM | POA: Diagnosis present

## 2024-06-19 MED ORDER — SODIUM CHLORIDE 0.9 % IV SOLN: INTRAVENOUS | Status: DC

## 2024-06-19 MED ORDER — IOHEXOL 350 MG/ML SOLN
125.0000 mL | Freq: Once | INTRAVENOUS | Status: AC | PRN
  Administered 2024-06-19: 100 mL via INTRAVENOUS

## 2024-06-19 MED ORDER — DIPHENHYDRAMINE HCL 25 MG PO CAPS
25.0000 mg | ORAL_CAPSULE | Freq: Once | ORAL | Status: AC
  Administered 2024-06-19: 25 mg via ORAL
  Filled 2024-06-19: qty 1

## 2024-06-19 MED ORDER — SODIUM CHLORIDE 0.9 % IV SOLN
510.0000 mg | Freq: Once | INTRAVENOUS | Status: AC
  Administered 2024-06-19: 510 mg via INTRAVENOUS
  Filled 2024-06-19: qty 510

## 2024-06-19 MED ORDER — ACETAMINOPHEN 325 MG PO TABS
650.0000 mg | ORAL_TABLET | Freq: Once | ORAL | Status: AC
  Administered 2024-06-19: 650 mg via ORAL
  Filled 2024-06-19: qty 2

## 2024-06-19 MED ORDER — IOHEXOL 350 MG/ML SOLN: 100.0000 mL | Freq: Once | INTRAVENOUS | Status: DC | PRN

## 2024-06-19 NOTE — Patient Instructions (Signed)

## 2024-06-22 ENCOUNTER — Ambulatory Visit: Payer: Self-pay | Admitting: Oncology

## 2024-06-22 ENCOUNTER — Telehealth: Payer: Self-pay

## 2024-06-22 NOTE — Telephone Encounter (Signed)
 Telephone call to patient regarding his MyChart message to reschedule his Feraheme infusion from this week to next week. Appointment rescheduled to Tuesday 1/202026 at 1400. Patient is aware of new appointment date and time and agreeable.

## 2024-06-26 ENCOUNTER — Inpatient Hospital Stay

## 2024-06-30 ENCOUNTER — Inpatient Hospital Stay

## 2024-06-30 VITALS — BP 119/53 | HR 60 | Temp 98.0°F | Resp 18

## 2024-06-30 DIAGNOSIS — D509 Iron deficiency anemia, unspecified: Secondary | ICD-10-CM

## 2024-06-30 DIAGNOSIS — D508 Other iron deficiency anemias: Secondary | ICD-10-CM | POA: Diagnosis not present

## 2024-06-30 MED ORDER — ACETAMINOPHEN 325 MG PO TABS
650.0000 mg | ORAL_TABLET | Freq: Once | ORAL | Status: AC
  Administered 2024-06-30: 650 mg via ORAL
  Filled 2024-06-30: qty 2

## 2024-06-30 MED ORDER — SODIUM CHLORIDE 0.9 % IV SOLN
510.0000 mg | Freq: Once | INTRAVENOUS | Status: AC
  Administered 2024-06-30: 510 mg via INTRAVENOUS
  Filled 2024-06-30: qty 510

## 2024-06-30 MED ORDER — SODIUM CHLORIDE 0.9 % IV SOLN: INTRAVENOUS | Status: DC

## 2024-06-30 MED ORDER — DIPHENHYDRAMINE HCL 25 MG PO CAPS
25.0000 mg | ORAL_CAPSULE | Freq: Once | ORAL | Status: AC
  Administered 2024-06-30: 25 mg via ORAL
  Filled 2024-06-30: qty 1

## 2024-06-30 NOTE — Patient Instructions (Signed)

## 2024-06-30 NOTE — Progress Notes (Signed)
 Pt tolerated IV feraheme without adverse events. Completed a 30 minute wait period post-infusion.

## 2024-07-02 ENCOUNTER — Ambulatory Visit
Attending: Thoracic Surgery (Cardiothoracic Vascular Surgery) | Admitting: Thoracic Surgery (Cardiothoracic Vascular Surgery)

## 2024-07-02 VITALS — BP 135/76 | HR 72 | Resp 20 | Ht 71.0 in | Wt 312.0 lb

## 2024-07-02 DIAGNOSIS — I350 Nonrheumatic aortic (valve) stenosis: Secondary | ICD-10-CM | POA: Diagnosis not present

## 2024-07-02 NOTE — Progress Notes (Signed)
 "     [address] [place] [address]             [phone]      MAVRIC CORTRIGHT [facility] Medical Record #[id number] Date of Birth: 08-07-1950  Referring: Elmira Newman PARAS, MD Primary Care: Wendolyn Jenkins Jansky, MD Primary Cardiologist:Manish PARAS Elmira, MD  Chief Complaint:    Chief Complaint  Patient presents with   Aortic Stenosis    TAVR consult/ review all testing    History of Present Illness:     LIONEL WOODBERRY is a 74 y.o. male presents for surgical evaluation of severe aortic stenosis.  He is symptomatic with chest pain, and worsening fatigue.  He has a history of prostate cancer treated with XRT, DM, and is s/p gastric bypass.        Past Medical History:  Diagnosis Date   Anemia    Arthritis    Diabetes mellitus-type II 02/06/2012   GERD (gastroesophageal reflux disease)    Occasional - only 1x q60m   H/O umbilical hernia repair-mesh used; done in [place] 02/06/2012   Hypercholesteremia 02/06/2012   Hypertension    Hypothyroidism    Lap Roux Y Gastric Bypass Dec 2013 06/09/2012   Morbid obesity with BMI of 55.8 06/05/2012   OSA on CPAP 02/06/2012   Prostate cancer (HCC)    radioactive seeds   Severe aortic stenosis     Past Surgical History:  Procedure Laterality Date   BREATH TEK H PYLORI  04/22/2012   Procedure: BREATH TEK H PYLORI;  Surgeon: Donnice KATHEE Lunger, MD;  Location: [facility];  Service: General;  Laterality: N/A;   CARDIAC CATHETERIZATION     GASTRIC ROUX-EN-Y  06/09/2012   Procedure: LAPAROSCOPIC ROUX-EN-Y GASTRIC;  Surgeon: Donnice KATHEE Lunger, MD;  Location: [facility];  Service: General;  Laterality: N/A;   HERNIA REPAIR  1992   umbilical   PROSTATE BIOPSY     RADIOACTIVE SEED IMPLANT N/A 12/09/2018   Procedure: RADIOACTIVE SEED IMPLANT/BRACHYTHERAPY IMPLANT;  Surgeon: Watt Rush, MD;  Location: [facility];  Service: Urology;  Laterality: N/A;   right knee arthroscopy  1999   RIGHT/LEFT HEART CATH AND CORONARY  ANGIOGRAPHY N/A 08/16/2023   Procedure: RIGHT/LEFT HEART CATH AND CORONARY ANGIOGRAPHY;  Surgeon: Elmira Newman PARAS, MD;  Location: [facility];  Service: Cardiovascular;  Laterality: N/A;   SPACE OAR INSTILLATION N/A 12/09/2018   Procedure: SPACE OAR INSTILLATION;  Surgeon: Watt Rush, MD;  Location: [facility];  Service: Urology;  Laterality: N/A;   TOTAL KNEE ARTHROPLASTY Right 12/18/2022   Procedure: TOTAL KNEE ARTHROPLASTY;  Surgeon: Josefina Chew, MD;  Location: [facility];  Service: Orthopedics;  Laterality: Right;   TOTAL KNEE ARTHROPLASTY Left 04/23/2023   Procedure: LEFT TOTAL KNEE ARTHROPLASTY;  Surgeon: Josefina Chew, MD;  Location: [facility];  Service: Orthopedics;  Laterality: Left;   VENTRAL HERNIA REPAIR  06/09/2012   Procedure: LAPAROSCOPIC VENTRAL HERNIA;  Surgeon: Donnice KATHEE Lunger, MD;  Location: [facility];  Service: General;;  primary repair of ventral hernia    Social History:  Tobacco Use History[1]  Social History   Substance and Sexual Activity  Alcohol Use Yes   Alcohol/week: 10.0 standard drinks of alcohol   Types: 2 Glasses of wine, 8 Shots of liquor per week   Comment: 2 shots sev times/wk     Allergies[2]    Current Outpatient Medications  Medication Sig Dispense Refill   CALCIUM PO Take  2 tablets by mouth daily.     diclofenac  Sodium (VOLTAREN ) 1 % GEL Apply 2 g topically 4 (four) times daily. 300 g 3   empagliflozin  (JARDIANCE ) 10 MG TABS tablet Take 1 tablet (10 mg total) by mouth daily before breakfast. 90 tablet 1   hydrochlorothiazide  (HYDRODIURIL ) 25 MG tablet Take 1 tablet (25 mg total) by mouth daily. 90 tablet 3   levothyroxine  (SYNTHROID ) 200 MCG tablet 1 tab po daily but 2 tabs on Monday and Friday 125 tablet 1   lisinopril  (ZESTRIL ) 10 MG tablet Take 1 tablet (10 mg total) by mouth daily. 90 tablet 1   metFORMIN  (GLUCOPHAGE ) 1000 MG tablet Take 1 tablet (1,000 mg total) by mouth daily with breakfast. 90 tablet 1   metoprolol  tartrate  (LOPRESSOR ) 50 MG tablet Take 1 tablet (50 mg total) by mouth once. Take 1 tablet by mouth 2 hours prior to CT scan. 1 tablet 0   Multiple Vitamins-Minerals (BARIATRIC MULTIVITAMINS) CAPS Take 2 capsules by mouth daily.     pravastatin  (PRAVACHOL ) 10 MG tablet Take 1 tablet (10 mg total) by mouth daily. 90 tablet 1   rivaroxaban  (XARELTO ) 20 MG TABS tablet Take 1 tablet (20 mg total) by mouth daily with supper. 30 tablet 6   tirzepatide  (MOUNJARO ) 7.5 MG/0.5ML Pen Inject 7.5 mg into the skin once a week. 2 mL 0   No current facility-administered medications for this visit.    (Not in a hospital admission)   Family History  Problem Relation Age of Onset   Breast cancer Mother    Vision loss Mother    Varicose Veins Mother    Heart disease Father        rhuematic heart disease   Diabetes Father    Hypertension Father    Obesity Father    Varicose Veins Father    Diabetes Brother        2 brothers   Hypertension Brother    Obesity Brother    Diabetes Maternal Grandmother    Obesity Brother    Stroke Brother    Prostate cancer Neg Hx    Colon cancer Neg Hx    Pancreatic cancer Neg Hx      Review of Systems:   Review of Systems  Constitutional:  Positive for malaise/fatigue.  Respiratory:  Positive for shortness of breath.   Cardiovascular:  Positive for chest pain. Negative for palpitations and leg swelling.  Neurological: Negative.       Physical Exam: BP 135/76   Pulse 72   Resp 20   Ht 5' 11 (1.803 m)   Wt (!) 312 lb (141.5 kg)   SpO2 98% Comment: RA  BMI 43.52 kg/m  Physical Exam Constitutional:      General: He is not in acute distress.    Appearance: He is not ill-appearing.  HENT:     Head: Normocephalic and atraumatic.  Eyes:     Extraocular Movements: Extraocular movements intact.  Cardiovascular:     Rate and Rhythm: Normal rate.  Pulmonary:     Effort: Pulmonary effort is normal. No respiratory distress.  Abdominal:     General: Abdomen is  flat. There is no distension.  Musculoskeletal:        General: Normal range of motion.     Cervical back: Normal range of motion.  Skin:    General: Skin is warm and dry.  Neurological:     General: No focal deficit present.     Mental Status: He  is alert and oriented to person, place, and time.       Cardiac Studies & Procedures   ______________________________________________________________________________________________ CARDIAC CATHETERIZATION  CARDIAC CATHETERIZATION 08/16/2023  Conclusion Coronary angiography 08/16/2023: LM: Normal LAD: Large, no significant disease Lcx: Large, no significant disease RCA: Dominant, no significant disease  LVEDP 28 mmHg  Right heart catheterization 08/16/2023: RA: 15 mmHg RV: 79/0 mmHg PA: 84/34 mmHg, mPAP 53 mmHg PCW: 31 mmHg  AO sats: 96% PA sats: 67%  CO: 7.6 L/min CI: 3.0 L/min/m2  LV-Ao pullback was performed given equivocal findings on echocardiogram Aortic valve mean PG 48 mmHg, AVA 0.94 mm2  Normal coronaries, no significant CAD Severe aortic stenosis Severe pulmonary hypertension. WHO Grp II Elevated filling pressures  Will refer to TAVR clinic  Newman JINNY Lawrence, MD  Findings Coronary Findings Diagnostic  Dominance: Right  Left Main Vessel is large. Vessel is angiographically normal.  Left Anterior Descending Vessel is large. Vessel is angiographically normal.  Left Circumflex Vessel is large. Vessel is angiographically normal.  Right Coronary Artery Vessel is normal in caliber. Vessel is angiographically normal.  Intervention  No interventions have been documented.   STRESS TESTS  MYOCARDIAL PERFUSION IMAGING 09/25/2022  Interpretation Summary   The study is normal. The study is low risk.   No ST deviation was noted.   LV perfusion is normal. There is no evidence of ischemia. There is no evidence of infarction.   Left ventricular function is normal. Nuclear stress EF: 54 %. The left  ventricular ejection fraction is mildly decreased (45-54%). End diastolic cavity size is normal. End systolic cavity size is normal.   Prior study available for comparison from 08/26/2018.  Normal stress nuclear study with inferior thinning but no ischemia or infarction; gated EF 54 with normal wall motion.   ECHOCARDIOGRAM  ECHOCARDIOGRAM COMPLETE 04/20/2024  Narrative ECHOCARDIOGRAM REPORT    Patient Name:   SAMARTH OGLE Date of Exam: 04/20/2024 Medical Rec #:  [id number]      Height:       71.0 in Accession #:    [id number]     Weight:       319.9 lb Date of Birth:  15-Dec-1950      BSA:          2.574 m Patient Age:    73 years       BP:           141/95 mmHg Patient Gender: M              HR:           81 bpm. Exam Location:  Outpatient  Procedure: 2D Echo, Color Doppler, Cardiac Doppler and Intracardiac Opacification Agent (Both Spectral and Color Flow Doppler were utilized during procedure).  Indications:     Research Study Patient z00.6 Kardigan/Katalyst  History:         Patient has prior history of Echocardiogram examinations, most recent 03/09/2024. Risk Factors:Hypertension, Sleep Apnea, Diabetes and Dyslipidemia.  Sonographer:     Melissa Morford RDCS (AE, PE) Referring Phys:  [id number] ARUN K THUKKANI Diagnosing Phys: Ezra McleanMD  IMPRESSIONS   1. Left ventricular ejection fraction, by estimation, is 60 to 65%. The left ventricle has normal function. The left ventricle has no regional wall motion abnormalities. There is mild concentric left ventricular hypertrophy of the septal segment. Left ventricular diastolic parameters are indeterminate. 2. Right ventricular systolic function is normal. The right ventricular size is mildly enlarged. Tricuspid regurgitation signal is inadequate for  assessing PA pressure. 3. Right atrial size was moderately dilated. 4. The mitral valve is normal in structure. Trivial mitral valve regurgitation. No evidence of mitral  stenosis. Moderate mitral annular calcification. 5. The aortic valve is tricuspid. There is severe calcifcation of the aortic valve. Aortic valve regurgitation is mild. Moderate to severe aortic valve stenosis (severe by mean gradient, moderate by calculated valve area). Aortic valve area, by VTI measures 1.23 cm. Aortic valve mean gradient measures 44.0 mmHg. Dimensionless index 0.27. 6. Aortic dilatation noted. There is mild dilatation of the ascending aorta, measuring 39 mm. 7. The inferior vena cava is normal in size with greater than 50% respiratory variability, suggesting right atrial pressure of 3 mmHg.  FINDINGS Left Ventricle: Left ventricular ejection fraction, by estimation, is 60 to 65%. The left ventricle has normal function. The left ventricle has no regional wall motion abnormalities. Definity  contrast agent was given IV to delineate the left ventricular endocardial borders. The left ventricular internal cavity size was normal in size. There is mild concentric left ventricular hypertrophy of the septal segment. Left ventricular diastolic parameters are indeterminate.  Right Ventricle: The right ventricular size is mildly enlarged. No increase in right ventricular wall thickness. Right ventricular systolic function is normal. Tricuspid regurgitation signal is inadequate for assessing PA pressure.  Left Atrium: Left atrial size was normal in size.  Right Atrium: Right atrial size was moderately dilated.  Pericardium: There is no evidence of pericardial effusion.  Mitral Valve: The mitral valve is normal in structure. There is moderate calcification of the mitral valve leaflet(s). Moderate mitral annular calcification. Trivial mitral valve regurgitation. No evidence of mitral valve stenosis.  Tricuspid Valve: The tricuspid valve is normal in structure. Tricuspid valve regurgitation is not demonstrated.  Aortic Valve: The aortic valve is tricuspid. There is severe calcifcation of  the aortic valve. Aortic valve regurgitation is mild. Moderate to severe aortic stenosis is present. Aortic valve mean gradient measures 44.0 mmHg. Aortic valve peak gradient measures 59.4 mmHg. Aortic valve area, by VTI measures 1.23 cm.  Pulmonic Valve: The pulmonic valve was grossly normal. Pulmonic valve regurgitation is trivial.  Aorta: The aortic root and ascending aorta are structurally normal, with no evidence of dilitation and aortic dilatation noted. Ascending aorta measurements are within normal limits for age when indexed to body surface area. There is mild dilatation of the ascending aorta, measuring 39 mm.  Venous: The inferior vena cava is normal in size with greater than 50% respiratory variability, suggesting right atrial pressure of 3 mmHg.  IAS/Shunts: No atrial level shunt detected by color flow Doppler.   LEFT VENTRICLE PLAX 2D LVIDd:         5.20 cm LVIDs:         3.80 cm LV PW:         1.00 cm LV IVS:        1.40 cm LVOT diam:     2.40 cm LV SV:         94 LV SV Index:   36 LVOT Area:     4.52 cm   RIGHT VENTRICLE RV S prime:     13.90 cm/s TAPSE (M-mode): 2.0 cm  LEFT ATRIUM             Index        RIGHT ATRIUM           Index LA diam:        4.50 cm 1.75 cm/m   RA Area:  27.90 cm LA Vol (A2C):   78.4 ml 30.45 ml/m  RA Volume:   103.00 ml 40.01 ml/m LA Vol (A4C):   51.3 ml 19.93 ml/m LA Biplane Vol: 66.3 ml 25.75 ml/m AORTIC VALVE AV Area (Vmax):    1.17 cm AV Area (Vmean):   1.18 cm AV Area (VTI):     1.23 cm AV Vmax:           385.40 cm/s AV Vmean:          285.600 cm/s AV VTI:            0.758 m AV Peak Grad:      59.4 mmHg AV Mean Grad:      44.0 mmHg LVOT Vmax:         100.00 cm/s LVOT Vmean:        74.500 cm/s LVOT VTI:          0.207 m LVOT/AV VTI ratio: 0.27  AORTA Ao Root diam: 3.40 cm Ao Asc diam:  3.70 cm   SHUNTS Systemic VTI:  0.21 m Systemic Diam: 2.40 cm  Dalton McleanMD Electronically signed by Ezra Kanner Signature Date/Time: 05/01/2024/2:13:09 PM    Final    MONITORS  CARDIAC EVENT MONITOR 09/03/2023  Narrative Mobile cardiac outpatient telemetry 30 days 09/03/2023 - 10/02/2023: Dominant rhythm: Sinus. 1 patient triggered episode correlated with sinus rhythm and first degree AV block 14 auto triggered episodes, including 8 episodes with first-degree and second-degree type I AV block (during awake hours).  No associated symptoms reported. 3% PVC burden. No associated symptoms reported. No other arrhythmias noted.   CT SCANS  CT CORONARY MORPH W/CTA COR W/SCORE 06/19/2024  Addendum 06/28/2024  7:37 PM ADDENDUM REPORT: 06/28/2024 19:35  EXAM: OVER-READ INTERPRETATION  CT CHEST  The following report is an over-read performed by radiologist Dr. Franky Leff [facility] on 06/28/2024. This over-read does not include interpretation of cardiac or coronary anatomy or pathology. The coronary CTA interpretation by the cardiologist is attached.  COMPARISON:  06/19/2024  FINDINGS: Heart is normal size. Aorta normal caliber. Densely calcified aortic valve. No adenopathy. No confluent airspace opacities or effusions. No acute findings in the upper abdomen.  IMPRESSION: No acute extra cardiac abnormality.   Electronically Signed By: Franky Crease M.D. On: 06/28/2024 19:35  Narrative CLINICAL DATA:  Aortic Stenosis  EXAM: Cardiac TAVR CT  TECHNIQUE: The patient was scanned on a GE apex scanner. 1 beat acquisition triggered in the descending thoracic aorta at 110 HU's. A non contrast, gated CT scan was obtained first with axial slices of 2.5 mm through the heart for valve scoring. A 120 kV retrospective, gated, contrast scan done with gantry rotation speed of 230 msec and collimation 0.63 mm. A delayed scan was obtained to exclude LAA thrombus. The 3D data set was reconstructed in 5% intervals of the R-R cycle. Best systolic phase was motion corrected  Images were analyzed on a dedicated workstation using MPR, MIP and VRT modes The patient received 100 cc of contrast  FINDINGS: Aortic Valve: Calcified tri leaflet AV with score 3376  Aorta: Bovine arch mild calcific atherosclerosis  Sino-tubular Junction: 27.5 mm  Ascending Thoracic Aorta: 34 mm  Aortic Arch: 29.3 mm  Descending Thoracic Aorta: 25.9 mm  Sinus of Valsalva Measurements:  Non-coronary: 32.9 mm  Height 21.6 mm  Right - coronary: 33.3 mm  Height 20.4 mm  Left -   coronary: 33.4 mm Height 22.7 mm  Coronary Artery Height above Annulus:  Left Main: 16.5 mm above annulus  Right Coronary: 15.5 mm above annulus  Virtual Basal Annulus Measurements:  Maximum / Minimum Diameter: 29.2 mm x 24.5 mm Average diameter 24.5 mm  Perimeter: 84.6 mm  Area: 550 mm2  Coronary Arteries: Sufficient height above annulus for deployment  Optimum Fluoroscopic Angle for Delivery: LAO 13 Caudal 6 degrees  Membranous septal length 8.3 mm  IMPRESSION: 1. Tri leaflet AV with calcium score 3376  2. Annular area of 550 mm2 lower end for a 29 mm Sapien 3 Alternatively can consider a 34 mm Medtronic Evolut  3.  Coronary arteries sufficient height above annulus for deployment  4.  Membranous septal length 8.3 mm  5. Optimum angiographic angle for deployment LAO 13 Caudal 6 degrees  Maude Emmer  Electronically Signed: By: Maude Emmer M.D. On: 06/19/2024 12:29   CT SCANS  CT CORONARY MORPH W/CTA COR W/SCORE 04/29/2024  Addendum 05/01/2024  7:50 PM ADDENDUM REPORT: 05/01/2024 19:47  EXAM: OVER-READ INTERPRETATION  CT CHEST  The following report is an over-read performed by radiologist Dr. Suzen Dials of [facility] on 05/01/2024. This over-read does not include interpretation of cardiac or coronary anatomy or pathology. The coronary calcium score/coronary CTA interpretation by the cardiologist is attached.  COMPARISON:   None.  FINDINGS: Cardiovascular: There are no significant extracardiac vascular findings.  Mediastinum/Nodes: There are no enlarged lymph nodes within the visualized mediastinum.  Lungs/Pleura: There is no pleural effusion. An ill-defined, 5 mm ground-glass appearing lung nodule is seen within the right middle lobe (axial CT image 89, CT series 306).  Upper abdomen: No significant findings in the visualized upper abdomen.  Musculoskeletal/Chest wall: No chest wall mass or suspicious osseous findings within the visualized chest.  IMPRESSION: No significant extracardiac findings within the visualized chest.   Electronically Signed By: Suzen Dials M.D. On: 05/01/2024 19:47  Narrative CLINICAL DATA:  Aortic Stenosis.  EXAM: Cardiac/Coronary CTA  TECHNIQUE: A non-contrast, gated CT scan was obtained with axial slices of 2.5 mm through the heart for calcium scoring. Calcium scoring was performed using the Agatston method. A 120 kV prospective, gated, contrast cardiac CT scan was obtained. Gantry rotation speed was 230 msec and collimation was 0.63 mm. Two sublingual nitroglycerin tablets (0.8 mg) were given. The 3D data set was reconstructed with motion correction for the best systolic or diastolic phase. Images were analyzed on a dedicated workstation using MPR, MIP, and VRT modes. The patient received 95 cc of contrast.  FINDINGS: Image quality: Excellent.  Noise artifact is: Limited.  Aortic valve: Tricuspid aortic valve with severe calcifications and severely restricted leaflet motion in systole. Aortic valve calcium score 3454.  Coronary Arteries: Coronary calcium score of 225. This was 56th percentile for age-, sex, and race-matched controls. Normal coronary origin. Right dominance. The study was performed without use of NTG and is insufficient for plaque evaluation.  Right Atrium: Right atrial size is within normal limits.  Right Ventricle: The right  ventricular cavity is within normal limits.  Left Atrium: Left atrial size is normal in size with no left atrial appendage filling defect.  Left Ventricle: The ventricular cavity size is within normal limits.  Pulmonary arteries: Dilated pulmonary artery suggestive of pulmonary hypertension.  Pulmonary veins: Normal pulmonary venous drainage.  Pericardium: Normal thickness without significant effusion or calcium present.  Mitral valve: The mitral valve is normal without significant calcification.  Aorta: Normal caliber without significant disease.  Extra-cardiac findings: See attached radiology report for non-cardiac structures.  IMPRESSION: 1. Tricuspid aortic valve with severe calcifications and severely restricted leaflet motion in systole. Aortic valve calcium score 3454.  2. Coronary calcium score of 225. This was 56th percentile for age-, sex, and race-matched controls.  3. Normal coronary origin with right dominance.  4. The study was performed without use of NTG and is insufficient for plaque evaluation.  5. Dilated pulmonary artery suggestive of pulmonary hypertension.  Darryle Decent, MD  Electronically Signed: By: Darryle Decent M.D. On: 04/30/2024 12:44     ______________________________________________________________________________________________      ECG Afib.    I have independently reviewed the above radiologic studies and discussed with the patient   Recent Lab Findings: Lab Results  Component Value Date   WBC 7.0 06/02/2024   HGB 11.8 (L) 06/02/2024   HCT 39.8 06/02/2024   PLT 208 06/02/2024   GLUCOSE 111 (H) 06/02/2024   CHOL 192 09/20/2023   TRIG 77.0 09/20/2023   HDL 101.00 09/20/2023   LDLCALC 75 09/20/2023   ALT 16 06/02/2024   AST 21 06/02/2024   NA 135 06/02/2024   K 4.9 06/02/2024   CL 99 06/02/2024   CREATININE 1.16 06/02/2024   BUN 24 (H) 06/02/2024   CO2 24 06/02/2024   TSH 2.41 03/23/2024   INR 0.9 12/03/2018    HGBA1C 6.4 03/23/2024      Assessment / Plan:   74 y.o. male with severe aortic stenosis.  STS score: 2.71.  NYHA Class II.  The risks and benefits of transfemoral TAVR were discussed in detail.  We also discussed possibility of an emergent sternotomy to address any procedural complications.  Based on our discussion, we collectively decided that an emergent sternotomy would be indicated.  He will need a 3 day washout for his xarelto .  The patient is  agreeable to proceed.  Based on my review of her LHC, echo, and CTA, I agree with the multidisciplinary plan to proceed with a transfemoral 29mm Sapien TAVR.  He would like to wait until March for his TAVR.       I  spent 30 minutes counseling the patient face to face.   Linnie MALVA Rayas 07/02/2024 1:00 PM          [1]  Social History Tobacco Use  Smoking Status Never   Passive exposure: Never  Smokeless Tobacco Never  [2] No Known Allergies  "

## 2024-07-03 ENCOUNTER — Encounter: Admitting: Thoracic Surgery (Cardiothoracic Vascular Surgery)

## 2024-07-03 ENCOUNTER — Telehealth: Payer: Self-pay

## 2024-07-03 NOTE — Telephone Encounter (Signed)
 Please advise on if you have received this fax and if it's been reviewed, completed, and signed and faxed back to avoid patient losing insurance. Please and thank you.   Copied from CRM #[id number]. Topic: General - Other >> Jul 03, 2024 11:21 AM Viola FALCON wrote: Reason for CRM: Glena from [employer] called to follow up on chronic condition form that was faxed several times since December. Confirmed correct fax number. They do need the confirmation of diagnosis by the end of February or they will be dis enrolling patient from insurance plan. They can accept it verbally, please call [phone].

## 2024-07-03 NOTE — Telephone Encounter (Signed)
 Recd and have put on Dr's [employer] machines

## 2024-07-06 ENCOUNTER — Other Ambulatory Visit (HOSPITAL_COMMUNITY): Payer: Self-pay

## 2024-07-06 ENCOUNTER — Encounter: Payer: Self-pay | Admitting: Oncology

## 2024-07-06 MED ORDER — MOUNJARO 10 MG/0.5ML ~~LOC~~ SOAJ
10.0000 mg | SUBCUTANEOUS | 0 refills | Status: AC
  Filled 2024-07-06: qty 2, 28d supply, fill #0

## 2024-07-06 NOTE — Addendum Note (Signed)
 Addended by: Chalese Peach D on: 07/06/2024 08:13 AM   Modules accepted: Orders

## 2024-07-07 ENCOUNTER — Encounter: Payer: Self-pay | Admitting: Gastroenterology

## 2024-07-15 ENCOUNTER — Other Ambulatory Visit (HOSPITAL_COMMUNITY)

## 2024-09-11 ENCOUNTER — Other Ambulatory Visit (HOSPITAL_COMMUNITY)

## 2024-09-22 ENCOUNTER — Ambulatory Visit: Admitting: Family Medicine

## 2024-09-24 ENCOUNTER — Ambulatory Visit: Admitting: Family Medicine

## 2024-09-25 ENCOUNTER — Ambulatory Visit: Admitting: Family Medicine

## 2024-09-29 ENCOUNTER — Inpatient Hospital Stay

## 2024-09-29 ENCOUNTER — Inpatient Hospital Stay: Admitting: Oncology

## 2024-10-29 ENCOUNTER — Ambulatory Visit
